# Patient Record
Sex: Male | Born: 1980 | Race: Black or African American | Hispanic: No | Marital: Married | Smoking: Current every day smoker
Health system: Southern US, Community
[De-identification: ages and names within clinical notes are randomized; demographics above are authoritative.]

## PROBLEM LIST (undated history)

## (undated) DIAGNOSIS — G4730 Sleep apnea, unspecified: Secondary | ICD-10-CM

## (undated) DIAGNOSIS — J45909 Unspecified asthma, uncomplicated: Secondary | ICD-10-CM

## (undated) DIAGNOSIS — I219 Acute myocardial infarction, unspecified: Secondary | ICD-10-CM

## (undated) DIAGNOSIS — I5030 Unspecified diastolic (congestive) heart failure: Secondary | ICD-10-CM

## (undated) DIAGNOSIS — I2510 Atherosclerotic heart disease of native coronary artery without angina pectoris: Secondary | ICD-10-CM

## (undated) DIAGNOSIS — K219 Gastro-esophageal reflux disease without esophagitis: Secondary | ICD-10-CM

## (undated) DIAGNOSIS — F419 Anxiety disorder, unspecified: Secondary | ICD-10-CM

## (undated) DIAGNOSIS — I10 Essential (primary) hypertension: Secondary | ICD-10-CM

## (undated) HISTORY — DX: Atherosclerotic heart disease of native coronary artery without angina pectoris: I25.10

## (undated) HISTORY — PX: SHOULDER SURGERY: SHX246

## (undated) HISTORY — DX: Unspecified diastolic (congestive) heart failure: I50.30

---

## 2004-08-23 ENCOUNTER — Emergency Department: Payer: Self-pay | Admitting: Emergency Medicine

## 2004-10-29 ENCOUNTER — Emergency Department: Payer: Self-pay | Admitting: Emergency Medicine

## 2009-07-19 ENCOUNTER — Inpatient Hospital Stay: Payer: Self-pay | Admitting: Surgery

## 2010-06-30 ENCOUNTER — Emergency Department: Payer: Self-pay | Admitting: Emergency Medicine

## 2011-05-17 ENCOUNTER — Emergency Department: Payer: Self-pay | Admitting: Unknown Physician Specialty

## 2012-10-13 ENCOUNTER — Emergency Department: Payer: Self-pay | Admitting: Emergency Medicine

## 2017-01-28 ENCOUNTER — Other Ambulatory Visit: Payer: Self-pay | Admitting: Family Medicine

## 2017-01-28 DIAGNOSIS — M25512 Pain in left shoulder: Secondary | ICD-10-CM

## 2017-02-17 ENCOUNTER — Other Ambulatory Visit: Payer: Self-pay | Admitting: Orthopedic Surgery

## 2017-02-17 DIAGNOSIS — M7542 Impingement syndrome of left shoulder: Secondary | ICD-10-CM

## 2017-02-17 DIAGNOSIS — Z139 Encounter for screening, unspecified: Secondary | ICD-10-CM

## 2017-03-02 ENCOUNTER — Ambulatory Visit: Payer: Self-pay

## 2017-03-12 ENCOUNTER — Ambulatory Visit
Admission: RE | Admit: 2017-03-12 | Discharge: 2017-03-12 | Disposition: A | Discharge status: Home / Self Care | Payer: No Typology Code available for payment source | Source: Ambulatory Visit | Attending: Orthopedic Surgery | Admitting: Orthopedic Surgery

## 2017-03-12 ENCOUNTER — Other Ambulatory Visit: Payer: Self-pay | Admitting: Orthopedic Surgery

## 2017-03-12 DIAGNOSIS — M7542 Impingement syndrome of left shoulder: Secondary | ICD-10-CM | POA: Insufficient documentation

## 2017-03-12 DIAGNOSIS — Z139 Encounter for screening, unspecified: Secondary | ICD-10-CM

## 2017-03-12 MED ORDER — GADOBENATE DIMEGLUMINE 529 MG/ML IV SOLN
5.0000 mL | Freq: Once | INTRAVENOUS | Status: AC | PRN
  Administered 2017-03-12: 0.01 mL via INTRA_ARTICULAR

## 2017-03-12 MED ORDER — IOPAMIDOL (ISOVUE-200) INJECTION 41%
50.0000 mL | Freq: Once | INTRAVENOUS | Status: AC
  Administered 2017-03-12: 15 mL via INTRA_ARTICULAR
  Filled 2017-03-12: qty 50

## 2017-03-12 MED ORDER — LIDOCAINE HCL (PF) 1 % IJ SOLN
10.0000 mL | Freq: Once | INTRAMUSCULAR | Status: AC
  Administered 2017-03-12: 10 mL
  Filled 2017-03-12: qty 10

## 2018-11-24 ENCOUNTER — Encounter: Payer: Self-pay | Admitting: Anesthesiology

## 2018-12-13 ENCOUNTER — Other Ambulatory Visit: Payer: Self-pay

## 2018-12-13 ENCOUNTER — Encounter
Admission: RE | Admit: 2018-12-13 | Discharge: 2018-12-13 | Disposition: A | Discharge status: Home / Self Care | Payer: No Typology Code available for payment source | Source: Ambulatory Visit | Attending: Surgery | Admitting: Surgery

## 2018-12-13 HISTORY — DX: Essential (primary) hypertension: I10

## 2018-12-13 HISTORY — DX: Anxiety disorder, unspecified: F41.9

## 2018-12-13 HISTORY — DX: Unspecified asthma, uncomplicated: J45.909

## 2018-12-13 HISTORY — DX: Sleep apnea, unspecified: G47.30

## 2018-12-13 HISTORY — DX: Gastro-esophageal reflux disease without esophagitis: K21.9

## 2018-12-13 NOTE — Patient Instructions (Signed)
Your procedure is scheduled on: 12-20-18 TUESDAY Report to Same Day Surgery 2nd floor medical mall [facility] Entrance-take elevator on left to 2nd floor.  Check in with surgery information desk.) To find out your arrival time please call [phone] between 1PM - 3PM on 12-19-18 MONDAY  Remember: Instructions that are not followed completely may result in serious medical risk, up to and including death, or upon the discretion of your surgeon and anesthesiologist your surgery may need to be rescheduled.    _x___ 1. Do not eat food after midnight the night before your procedure. NO GUM OR CANDY AFTER MIDNIGHT.  You may drink clear liquids up to 2 hours before you are scheduled to arrive at the hospital for your procedure.  Do not drink clear liquids within 2 hours of your scheduled arrival to the hospital.  Clear liquids include  --Water or Apple juice without pulp  --Clear carbohydrate beverage such as ClearFast or Gatorade  --Black Coffee or Clear Tea (No milk, no creamers, do not add anything to the coffee or Tea   ____Ensure clear carbohydrate drink on the way to the hospital for bariatric patients  ____Ensure clear carbohydrate drink 3 hours before surgery for Dr Rutherford Nail patients if physician instructed.    __x__ 2. No Alcohol for 24 hours before or after surgery.   __x__3. No Smoking or e-cigarettes for 24 prior to surgery.  Do not use any chewable tobacco products for at least 6 hour prior to surgery   ____  4. Bring all medications with you on the day of surgery if instructed.    __x__ 5. Notify your doctor if there is any change in your medical condition     (cold, fever, infections).    x___6. On the morning of surgery brush your teeth with toothpaste and water.  You may rinse your mouth with mouth wash if you wish.  Do not swallow any toothpaste or mouthwash.   Do not wear jewelry, make-up, hairpins, clips or nail polish.  Do not wear lotions, powders, or perfumes. You  may wear deodorant.  Do not shave 48 hours prior to surgery. Men may shave face and neck.  Do not bring valuables to the hospital.    [facility] is not responsible for any belongings or valuables.               Contacts, dentures or bridgework may not be worn into surgery.  Leave your suitcase in the car. After surgery it may be brought to your room.  For patients admitted to the hospital, discharge time is determined by your treatment team.  _  Patients discharged the day of surgery will not be allowed to drive home.  You will need someone to drive you home and stay with you the night of your procedure.    Please read over the following fact sheets that you were given:   [facility] Preparing for Surgery   ____ Take anti-hypertensive listed below, cardiac, seizure, asthma, anti-reflux and psychiatric medicines. These include:  1. NONE  2.  3.  4.  5.  6.  ____Fleets enema or Magnesium Citrate as directed.   _x___ Use CHG Soap or sage wipes as directed on instruction sheet   _X___ Use inhalers on the day of surgery and bring to hospital day of surgery-USE YOUR ALBUTEROL INHALER AND BRING TO HOSPITAL  ____ Stop Metformin and Janumet 2 days prior to surgery.    ____ Take 1/2 of usual insulin dose  the night before surgery and none on the morning surgery.   ____ Follow recommendations from Cardiologist, Pulmonologist or PCP regarding stopping Aspirin, Coumadin, Plavix ,Eliquis, Effient, or Pradaxa, and Pletal.  X____Stop Anti-inflammatories such as Advil, Aleve, Ibuprofen, Motrin, Naproxen, Naprosyn, Goodies powders or aspirin products NOW-OK to take Tylenol    ____ Stop supplements until after surgery.    _X___ Bring C-Pap to the hospital.

## 2018-12-14 ENCOUNTER — Encounter
Admission: RE | Admit: 2018-12-14 | Discharge: 2018-12-14 | Disposition: A | Discharge status: Home / Self Care | Payer: No Typology Code available for payment source | Source: Ambulatory Visit | Attending: Surgery | Admitting: Surgery

## 2018-12-14 ENCOUNTER — Other Ambulatory Visit: Payer: Self-pay

## 2018-12-14 DIAGNOSIS — Z01818 Encounter for other preprocedural examination: Secondary | ICD-10-CM | POA: Diagnosis present

## 2018-12-14 DIAGNOSIS — I10 Essential (primary) hypertension: Secondary | ICD-10-CM | POA: Insufficient documentation

## 2018-12-14 LAB — POTASSIUM: Potassium: 3.8 mmol/L (ref 3.5–5.1)

## 2018-12-19 MED ORDER — DEXTROSE 5 % IV SOLN
3.0000 g | Freq: Once | INTRAVENOUS | Status: AC
  Administered 2018-12-20: 3 g via INTRAVENOUS
  Filled 2018-12-19: qty 3

## 2018-12-20 ENCOUNTER — Other Ambulatory Visit: Payer: Self-pay

## 2018-12-20 ENCOUNTER — Encounter: Payer: Self-pay | Admitting: Anesthesiology

## 2018-12-20 ENCOUNTER — Ambulatory Visit
Admission: RE | Admit: 2018-12-20 | Discharge: 2018-12-20 | Disposition: A | Discharge status: Home / Self Care | Payer: No Typology Code available for payment source | Attending: Surgery | Admitting: Surgery

## 2018-12-20 ENCOUNTER — Encounter
Admission: RE | Disposition: A | Discharge status: Home / Self Care | Payer: Self-pay | Source: Home / Self Care | Attending: Surgery

## 2018-12-20 DIAGNOSIS — Z79899 Other long term (current) drug therapy: Secondary | ICD-10-CM | POA: Diagnosis not present

## 2018-12-20 DIAGNOSIS — G4733 Obstructive sleep apnea (adult) (pediatric): Secondary | ICD-10-CM | POA: Insufficient documentation

## 2018-12-20 DIAGNOSIS — Z6841 Body Mass Index (BMI) 40.0 and over, adult: Secondary | ICD-10-CM | POA: Diagnosis not present

## 2018-12-20 DIAGNOSIS — F17200 Nicotine dependence, unspecified, uncomplicated: Secondary | ICD-10-CM | POA: Insufficient documentation

## 2018-12-20 DIAGNOSIS — W010XXA Fall on same level from slipping, tripping and stumbling without subsequent striking against object, initial encounter: Secondary | ICD-10-CM | POA: Insufficient documentation

## 2018-12-20 DIAGNOSIS — J45909 Unspecified asthma, uncomplicated: Secondary | ICD-10-CM | POA: Insufficient documentation

## 2018-12-20 DIAGNOSIS — I10 Essential (primary) hypertension: Secondary | ICD-10-CM | POA: Diagnosis not present

## 2018-12-20 DIAGNOSIS — S63682A Other sprain of left thumb, initial encounter: Secondary | ICD-10-CM | POA: Insufficient documentation

## 2018-12-20 HISTORY — PX: CARPOMETACARPAL (CMC) FUSION OF THUMB: SHX6290

## 2018-12-20 SURGERY — CARPOMETACARPAL (CMC) FUSION OF THUMB
Anesthesia: General | Site: Hand | Laterality: Left

## 2018-12-20 MED ORDER — BUPIVACAINE HCL (PF) 0.5 % IJ SOLN
INTRAMUSCULAR | Status: DC | PRN
  Administered 2018-12-20: 10 mL

## 2018-12-20 MED ORDER — PROPOFOL 10 MG/ML IV BOLUS
INTRAVENOUS | Status: AC
  Filled 2018-12-20: qty 20

## 2018-12-20 MED ORDER — ONDANSETRON HCL 4 MG/2ML IJ SOLN
INTRAMUSCULAR | Status: AC
  Filled 2018-12-20: qty 2

## 2018-12-20 MED ORDER — HYDROCODONE-ACETAMINOPHEN 5-325 MG PO TABS
1.0000 | ORAL_TABLET | ORAL | Status: DC | PRN
  Administered 2018-12-20: 2 via ORAL

## 2018-12-20 MED ORDER — PHENYLEPHRINE HCL 10 MG/ML IJ SOLN
INTRAMUSCULAR | Status: DC | PRN
  Administered 2018-12-20 (×4): 100 ug via INTRAVENOUS

## 2018-12-20 MED ORDER — ONDANSETRON HCL 4 MG/2ML IJ SOLN: 4.0000 mg | Freq: Four times a day (QID) | INTRAMUSCULAR | Status: DC | PRN

## 2018-12-20 MED ORDER — FENTANYL CITRATE (PF) 100 MCG/2ML IJ SOLN
INTRAMUSCULAR | Status: AC
  Filled 2018-12-20: qty 2

## 2018-12-20 MED ORDER — DEXAMETHASONE SODIUM PHOSPHATE 10 MG/ML IJ SOLN
INTRAMUSCULAR | Status: DC | PRN
  Administered 2018-12-20: 5 mg via INTRAVENOUS

## 2018-12-20 MED ORDER — POTASSIUM CHLORIDE IN NACL 20-0.9 MEQ/L-% IV SOLN: INTRAVENOUS | Status: DC

## 2018-12-20 MED ORDER — LACTATED RINGERS IV SOLN
INTRAVENOUS | Status: DC
  Administered 2018-12-20: 15:00:00 via INTRAVENOUS

## 2018-12-20 MED ORDER — PROPOFOL 10 MG/ML IV BOLUS
INTRAVENOUS | Status: DC | PRN
  Administered 2018-12-20: 200 mg via INTRAVENOUS
  Administered 2018-12-20: 50 mg via INTRAVENOUS
  Administered 2018-12-20: 100 mg via INTRAVENOUS

## 2018-12-20 MED ORDER — FENTANYL CITRATE (PF) 100 MCG/2ML IJ SOLN
INTRAMUSCULAR | Status: AC
  Administered 2018-12-20: 50 ug via INTRAVENOUS
  Filled 2018-12-20: qty 2

## 2018-12-20 MED ORDER — ACETAMINOPHEN 10 MG/ML IV SOLN
INTRAVENOUS | Status: DC | PRN
  Administered 2018-12-20: 1000 mg via INTRAVENOUS

## 2018-12-20 MED ORDER — ONDANSETRON HCL 4 MG PO TABS: 4.0000 mg | ORAL_TABLET | Freq: Four times a day (QID) | ORAL | Status: DC | PRN

## 2018-12-20 MED ORDER — EPHEDRINE SULFATE 50 MG/ML IJ SOLN
INTRAMUSCULAR | Status: DC | PRN
  Administered 2018-12-20: 5 mg via INTRAVENOUS

## 2018-12-20 MED ORDER — METOCLOPRAMIDE HCL 10 MG PO TABS: 5.0000 mg | ORAL_TABLET | Freq: Three times a day (TID) | ORAL | Status: DC | PRN

## 2018-12-20 MED ORDER — FAMOTIDINE 20 MG PO TABS
ORAL_TABLET | ORAL | Status: AC
  Administered 2018-12-20: 20 mg via ORAL
  Filled 2018-12-20: qty 1

## 2018-12-20 MED ORDER — FENTANYL CITRATE (PF) 100 MCG/2ML IJ SOLN
INTRAMUSCULAR | Status: DC | PRN
  Administered 2018-12-20: 25 ug via INTRAVENOUS
  Administered 2018-12-20 (×3): 50 ug via INTRAVENOUS
  Administered 2018-12-20: 25 ug via INTRAVENOUS

## 2018-12-20 MED ORDER — SODIUM CHLORIDE FLUSH 0.9 % IV SOLN
INTRAVENOUS | Status: AC
  Filled 2018-12-20: qty 10

## 2018-12-20 MED ORDER — KETOROLAC TROMETHAMINE 30 MG/ML IJ SOLN
INTRAMUSCULAR | Status: AC
  Filled 2018-12-20: qty 1

## 2018-12-20 MED ORDER — OXYCODONE HCL 5 MG PO TABS: 5.0000 mg | ORAL_TABLET | ORAL | 0 refills | Status: DC | PRN

## 2018-12-20 MED ORDER — DEXAMETHASONE SODIUM PHOSPHATE 10 MG/ML IJ SOLN
INTRAMUSCULAR | Status: AC
  Filled 2018-12-20: qty 1

## 2018-12-20 MED ORDER — ACETAMINOPHEN 10 MG/ML IV SOLN
INTRAVENOUS | Status: AC
  Filled 2018-12-20: qty 100

## 2018-12-20 MED ORDER — MIDAZOLAM HCL 2 MG/2ML IJ SOLN
INTRAMUSCULAR | Status: DC | PRN
  Administered 2018-12-20: 2 mg via INTRAVENOUS

## 2018-12-20 MED ORDER — FENTANYL CITRATE (PF) 100 MCG/2ML IJ SOLN
25.0000 ug | INTRAMUSCULAR | Status: DC | PRN
  Administered 2018-12-20 (×3): 50 ug via INTRAVENOUS

## 2018-12-20 MED ORDER — HYDROCODONE-ACETAMINOPHEN 5-325 MG PO TABS
ORAL_TABLET | ORAL | Status: AC
  Filled 2018-12-20: qty 2

## 2018-12-20 MED ORDER — FAMOTIDINE 20 MG PO TABS
20.0000 mg | ORAL_TABLET | Freq: Once | ORAL | Status: AC
  Administered 2018-12-20: 20 mg via ORAL

## 2018-12-20 MED ORDER — METOCLOPRAMIDE HCL 5 MG/ML IJ SOLN: 5.0000 mg | Freq: Three times a day (TID) | INTRAMUSCULAR | Status: DC | PRN

## 2018-12-20 MED ORDER — HYDROCODONE-ACETAMINOPHEN 5-325 MG PO TABS: 1.0000 | ORAL_TABLET | Freq: Four times a day (QID) | ORAL | 0 refills | Status: DC | PRN

## 2018-12-20 MED ORDER — KETOROLAC TROMETHAMINE 30 MG/ML IJ SOLN
30.0000 mg | Freq: Once | INTRAMUSCULAR | Status: AC
  Administered 2018-12-20: 30 mg via INTRAVENOUS

## 2018-12-20 MED ORDER — ONDANSETRON HCL 4 MG/2ML IJ SOLN
INTRAMUSCULAR | Status: DC | PRN
  Administered 2018-12-20: 4 mg via INTRAVENOUS

## 2018-12-20 MED ORDER — PROMETHAZINE HCL 25 MG/ML IJ SOLN: 6.2500 mg | INTRAMUSCULAR | Status: DC | PRN

## 2018-12-20 MED ORDER — LIDOCAINE HCL (CARDIAC) PF 100 MG/5ML IV SOSY
PREFILLED_SYRINGE | INTRAVENOUS | Status: DC | PRN
  Administered 2018-12-20: 100 mg via INTRAVENOUS

## 2018-12-20 MED ORDER — GLYCOPYRROLATE 0.2 MG/ML IJ SOLN
INTRAMUSCULAR | Status: DC | PRN
  Administered 2018-12-20: 0.2 mg via INTRAVENOUS

## 2018-12-20 MED ORDER — MIDAZOLAM HCL 2 MG/2ML IJ SOLN
INTRAMUSCULAR | Status: AC
  Filled 2018-12-20: qty 2

## 2018-12-20 SURGICAL SUPPLY — 46 items
ANCHOR REPAIR HAND WRIST (Orthopedic Implant) ×2 IMPLANT
BANDAGE ACE 3X5.8 VEL STRL LF (GAUZE/BANDAGES/DRESSINGS) ×3 IMPLANT
BLADE DEBAKEY 8.0 (BLADE) ×2 IMPLANT
BLADE DEBAKEY 8.0MM (BLADE) ×1
BLADE OSC/SAGITTAL 5.5X25 (BLADE) ×3 IMPLANT
BLADE SURG SZ12 CARB STEEL (BLADE) ×6 IMPLANT
BNDG COHESIVE 4X5 TAN STRL (GAUZE/BANDAGES/DRESSINGS) ×3 IMPLANT
BNDG CONFORM 3 STRL LF (GAUZE/BANDAGES/DRESSINGS) ×3 IMPLANT
BNDG ESMARK 4X12 TAN STRL LF (GAUZE/BANDAGES/DRESSINGS) ×3 IMPLANT
BUR 4X55 1 (BURR) ×3 IMPLANT
CAST PADDING 3X4FT ST 30246 (SOFTGOODS) ×4
CLOSURE WOUND 1/2 X4 (GAUZE/BANDAGES/DRESSINGS) ×1
COVER WAND RF STERILE (DRAPES) ×3 IMPLANT
CUFF TOURN 18 STER (MISCELLANEOUS) ×3 IMPLANT
DRAPE FLUOR MINI C-ARM 54X84 (DRAPES) ×5 IMPLANT
DURAPREP 26ML APPLICATOR (WOUND CARE) ×6 IMPLANT
FORCEPS JEWEL BIP 4-3/4 STR (INSTRUMENTS) ×3 IMPLANT
GAUZE PETRO XEROFOAM 1X8 (MISCELLANEOUS) ×3 IMPLANT
GLOVE BIO SURGEON STRL SZ8 (GLOVE) ×6 IMPLANT
GLOVE INDICATOR 8.0 STRL GRN (GLOVE) ×3 IMPLANT
GOWN STRL REUS W/ TWL LRG LVL3 (GOWN DISPOSABLE) ×1 IMPLANT
GOWN STRL REUS W/ TWL XL LVL3 (GOWN DISPOSABLE) ×1 IMPLANT
GOWN STRL REUS W/TWL LRG LVL3 (GOWN DISPOSABLE) ×2
GOWN STRL REUS W/TWL XL LVL3 (GOWN DISPOSABLE) ×2
KIT TURNOVER KIT A (KITS) ×3 IMPLANT
NDL HYPO 25X1 1.5 SAFETY (NEEDLE) ×1 IMPLANT
NDL KEITH SZ2.5 (NEEDLE) ×3 IMPLANT
NEEDLE HYPO 25X1 1.5 SAFETY (NEEDLE) ×3 IMPLANT
NS IRRIG 500ML POUR BTL (IV SOLUTION) ×3 IMPLANT
PACK EXTREMITY ARMC (MISCELLANEOUS) ×3 IMPLANT
PAD CAST CTTN 3X4 STRL (SOFTGOODS) ×2 IMPLANT
PASSER SUT SWANSON 36MM LOOP (INSTRUMENTS) ×3 IMPLANT
SPLINT WRIST M LT TX990308 (SOFTGOODS) ×3 IMPLANT
STOCKINETTE 48X4 2 PLY STRL (GAUZE/BANDAGES/DRESSINGS) ×1 IMPLANT
STOCKINETTE IMPERVIOUS 9X36 MD (GAUZE/BANDAGES/DRESSINGS) ×3 IMPLANT
STOCKINETTE STRL 4IN 9604848 (GAUZE/BANDAGES/DRESSINGS) ×3 IMPLANT
STRIP CLOSURE SKIN 1/2X4 (GAUZE/BANDAGES/DRESSINGS) ×2 IMPLANT
SUT ETHIBOND 0 MO6 C/R (SUTURE) ×3 IMPLANT
SUT PROLENE 4 0 PS 2 18 (SUTURE) ×5 IMPLANT
SUT VIC AB 2-0 CT2 27 (SUTURE) ×3 IMPLANT
SUT VIC AB 2-0 SH 27 (SUTURE) ×4
SUT VIC AB 2-0 SH 27XBRD (SUTURE) ×1 IMPLANT
SUT VIC AB 3-0 SH 27 (SUTURE) ×2
SUT VIC AB 3-0 SH 27X BRD (SUTURE) ×1 IMPLANT
SYR 10ML LL (SYRINGE) ×3 IMPLANT
WIRE Z .062 C-WIRE SPADE TIP (WIRE) ×6 IMPLANT

## 2018-12-20 NOTE — Op Note (Signed)
12/20/2018  6:40 PM  Patient:   Marcus Bauer  Pre-Op Diagnosis:   Chronic tear of ulnar collateral ligament, left thumb.  Post-Op Diagnosis:   Same  Procedure:   Primary repair of ulnar collateral ligament, left thumb.  Surgeon:   Maryagnes Amos, MD  Assistant:   None  Anesthesia:   General LMA  Findings:   As above.  Complications:   None  EBL:   0 cc  Fluids:   900 cc crystalloid  TT:   53 minutes at 250 mmHg  Drains:   None  Closure:   4-0 Prolene interrupted sutures.  Implants:   3.5 mm Arthrex SwiveLocks 2  Brief Clinical Note:   The patient is a 38 year old male who injured his thumb about 4-5 months ago when he tripped and fell forward onto his left hand while trying to catch himself. Initially, he did not seek treatment and his symptoms of pain and swelling gradually improved. However, he continued to have difficulty holding and grasping objects. The patient's history and examination are consistent with persistent disruption of the ulnar collateral ligament of the left thumb MCP joint. The patient presents at this time for repair of the ulnar collateral ligament of the left thumb.  Procedure:   The patient was brought into the operating room and lain in the supine position. After adequate general laryngal mask anesthesia was obtained, the patient's left extremity were prepped with ChloraPrep solution before being draped sterilely. Preoperative antibiotics were administered. A timeout was performed to verify the appropriate surgical site before the limb was exsanguinated with an Esmarch and the tourniquet inflated to 250 mmHg. An approximately 2.5-3 cm incision was made over the ulnar aspect of the thumb MCP joint. The incision was carried down through the subcutaneous tissues with care taken to protect any neurovascular structures. The ulnar collateral ligament was identified and dissected free from the surrounding scar tissue.   Careful dissection was carried out  along the ulnar side of the base of the proximal phalanx as well as over the ulnar side of the metacarpal head just proximal to the ulnar collateral ligament origin. A 1.35 mm guidewire was inserted in the dorsal ulnar aspect of the metacarpal head at the origin of the UCL insertion site. A second guidewire was placed in the volar ulnar aspect of the base of the proximal phalanx. Each of these guidewires were overreamed with a 3.2 mm cannulated drill. A 3.5 mm Arthrex SwiveLock anchor was inserted distally after loading a 2-0 FiberWire and a 1.5 mm FiberTape. The FiberWire suture was passed through the ulnar collateral ligament and tied securely to secure the ulnar collateral ligament to the base of the proximal phalanx. A second 3.5 mm Arthrex SwiveLock anchor was placed proximally after loading 1 and of the 1.5 mm FiberTape and a second 2-0 FiberWire. With the thumb MCP joint flexed to 30, the anchor was inserted and tightened securely to reinforce the primary repair of the ulnar collateral ligament. he second 2-0 FiberWire was passed through the proximal portion of the ulnar collateral ligament to stabilize it to the metacarpal head. The two ends of the 1.5 mm fiber tape were then tied back to each other to further reinforce the repair. Gentle stretching of the repair demonstrated excellent stability.   The wound was thoroughly irrigated with sterile saline solution using bulb irrigation before the skin was closed using 2-0 Vicryl subcuticular sutures. Benzoin and Steri-Strips are applied to the skin before a 2-0 Vicryl  suture was used to repair the proximal fibers of the adductor hood, which had been released at the beginning the case to better visualize the ulnar collateral ligament and base of the proximal phalanx. A total of 10 cc of 0.5% plain Sensorcaine was injected in and around the incision site to help with postoperative analgesia. A thumb spica splint was applied to the thumb and forearm,  maintaining the wrist in neutral position and the thumb in slight abduction. The patient was then awakened, extubated, and returned to the recovery room in satisfactory condition after tolerating the procedure well.

## 2018-12-20 NOTE — H&P (Signed)
Paper H&P to be scanned into permanent record. H&P reviewed and patient re-examined. No changes. 

## 2018-12-20 NOTE — Transfer of Care (Signed)
Immediate Anesthesia Transfer of Care Note  Patient: Marcus Bauer  Procedure(s) Performed: REPAIR OF THE ULNAR COLLATERAL LIGAMENT OF THUMB (Left Hand)  Patient Location: PACU  Anesthesia Type:General  Level of Consciousness: awake, alert  and patient cooperative  Airway & Oxygen Therapy: Patient Spontanous Breathing and Patient connected to nasal cannula oxygen  Post-op Assessment: Report given to RN and Post -op Vital signs reviewed and stable  Post vital signs: Reviewed and stable  Last Vitals:  Vitals Value Taken Time  BP 99/78 12/20/2018  6:43 PM  Temp    Pulse 99 12/20/2018  6:45 PM  Resp 29 12/20/2018  6:45 PM  SpO2 99 % 12/20/2018  6:45 PM  Vitals shown include unvalidated device data.  Last Pain:  Vitals:   12/20/18 1441  TempSrc: Tympanic  PainSc: 6          Complications: No apparent anesthesia complications

## 2018-12-20 NOTE — Anesthesia Post-op Follow-up Note (Signed)
Anesthesia QCDR form completed.        

## 2018-12-20 NOTE — Anesthesia Procedure Notes (Signed)
Procedure Name: LMA Insertion Date/Time: 12/20/2018 5:10 PM Performed by: Karoline Caldwell, CRNA Pre-anesthesia Checklist: Patient identified, Patient being monitored, Timeout performed, Emergency Drugs available and Suction available Patient Re-evaluated:Patient Re-evaluated prior to induction Oxygen Delivery Method: Circle system utilized Preoxygenation: Pre-oxygenation with 100% oxygen Induction Type: IV induction Ventilation: Mask ventilation without difficulty and Oral airway inserted - appropriate to patient size LMA: LMA inserted LMA Size: 5.0 Tube type: Oral Number of attempts: 2 Placement Confirmation: positive ETCO2 and breath sounds checked- equal and bilateral Tube secured with: Tape Dental Injury: Teeth and Oropharynx as per pre-operative assessment  Comments: Unable to achieve adequate tidal volume with 4.5 LMA, removed and 5 LMA placed.

## 2018-12-20 NOTE — Discharge Instructions (Addendum)
Orthopedic discharge instructions: Keep splint dry and intact. Keep hand elevated above heart level. Apply ice to affected area frequently. Take ibuprofen 600-800 mg TID with meals for 7-10 days, then as necessary. Take pain medication as prescribed or ES Tylenol when needed.  Return for follow-up in 10-14 days or as scheduled. 

## 2018-12-20 NOTE — Anesthesia Preprocedure Evaluation (Signed)
Anesthesia Evaluation  Patient identified by MRN, date of birth, ID band Patient awake    Reviewed: Allergy & Precautions, H&P , NPO status , Patient's Chart, lab work & pertinent test results, reviewed documented beta blocker date and time   History of Anesthesia Complications (+) history of anesthetic complications  Airway Mallampati: III  TM Distance: >3 FB Neck ROM: full    Dental  (+) Dental Advidsory Given, Teeth Intact   Pulmonary neg shortness of breath, asthma , sleep apnea , neg COPD, neg recent URI, Current Smoker,           Cardiovascular Exercise Tolerance: Good hypertension, (-) angina(-) CAD, (-) Past MI, (-) Cardiac Stents and (-) CABG (-) dysrhythmias (-) Valvular Problems/Murmurs     Neuro/Psych Anxiety negative neurological ROS  negative psych ROS   GI/Hepatic Neg liver ROS, GERD  ,  Endo/Other  diabetes (borderline)Morbid obesity  Renal/GU negative Renal ROS  negative genitourinary   Musculoskeletal   Abdominal   Peds  Hematology negative hematology ROS (+)   Anesthesia Other Findings Past Medical History: No date: Anxiety     Comment:  NO MEDS No date: Asthma     Comment:  WELL CONTROLLED No date: GERD (gastroesophageal reflux disease)     Comment:  OCC TUMS No date: Hypertension No date: Sleep apnea     Comment:  WILL OCC USE NOT EVERY DAY   Reproductive/Obstetrics negative OB ROS                             Anesthesia Physical Anesthesia Plan  ASA: III  Anesthesia Plan: General   Post-op Pain Management:    Induction: Intravenous  PONV Risk Score and Plan: 1 and Ondansetron, Dexamethasone, Midazolam, Treatment may vary due to age or medical condition and Promethazine  Airway Management Planned: Oral ETT and LMA  Additional Equipment:   Intra-op Plan:   Post-operative Plan: Extubation in OR  Informed Consent: I have reviewed the patients History  and Physical, chart, labs and discussed the procedure including the risks, benefits and alternatives for the proposed anesthesia with the patient or authorized representative who has indicated his/her understanding and acceptance.     Dental Advisory Given  Plan Discussed with: Anesthesiologist, CRNA and Surgeon  Anesthesia Plan Comments:         Anesthesia Quick Evaluation

## 2018-12-21 ENCOUNTER — Encounter: Payer: Self-pay | Admitting: Surgery

## 2018-12-21 NOTE — Anesthesia Postprocedure Evaluation (Signed)
Anesthesia Post Note  Patient: Marcus Bauer  Procedure(s) Performed: REPAIR OF THE ULNAR COLLATERAL LIGAMENT OF THUMB (Left Hand)  Patient location during evaluation: PACU Anesthesia Type: General Level of consciousness: awake and alert Pain management: pain level controlled Vital Signs Assessment: post-procedure vital signs reviewed and stable Respiratory status: spontaneous breathing, nonlabored ventilation, respiratory function stable and patient connected to nasal cannula oxygen Cardiovascular status: blood pressure returned to baseline and stable Postop Assessment: no apparent nausea or vomiting Anesthetic complications: no     Last Vitals:  Vitals:   12/20/18 1925 12/20/18 1940  BP:    Pulse: 89 88  Resp:  20  Temp:    SpO2:  96%    Last Pain:  Vitals:   12/20/18 1910  TempSrc:   PainSc: 7                  Lenard Simmer

## 2019-08-12 ENCOUNTER — Emergency Department: Payer: No Typology Code available for payment source

## 2019-08-12 ENCOUNTER — Inpatient Hospital Stay
Admission: EM | Admit: 2019-08-12 | Discharge: 2019-08-15 | DRG: 281 | Disposition: A | Discharge status: Home / Self Care | Payer: No Typology Code available for payment source | Attending: Internal Medicine | Admitting: Internal Medicine

## 2019-08-12 ENCOUNTER — Observation Stay: Payer: No Typology Code available for payment source

## 2019-08-12 ENCOUNTER — Other Ambulatory Visit: Payer: Self-pay

## 2019-08-12 DIAGNOSIS — R079 Chest pain, unspecified: Secondary | ICD-10-CM | POA: Diagnosis not present

## 2019-08-12 DIAGNOSIS — F411 Generalized anxiety disorder: Secondary | ICD-10-CM | POA: Diagnosis present

## 2019-08-12 DIAGNOSIS — I214 Non-ST elevation (NSTEMI) myocardial infarction: Secondary | ICD-10-CM | POA: Diagnosis not present

## 2019-08-12 DIAGNOSIS — E785 Hyperlipidemia, unspecified: Secondary | ICD-10-CM | POA: Diagnosis present

## 2019-08-12 DIAGNOSIS — K219 Gastro-esophageal reflux disease without esophagitis: Secondary | ICD-10-CM | POA: Diagnosis present

## 2019-08-12 DIAGNOSIS — R55 Syncope and collapse: Secondary | ICD-10-CM | POA: Diagnosis present

## 2019-08-12 DIAGNOSIS — I10 Essential (primary) hypertension: Secondary | ICD-10-CM | POA: Diagnosis present

## 2019-08-12 DIAGNOSIS — Z20828 Contact with and (suspected) exposure to other viral communicable diseases: Secondary | ICD-10-CM | POA: Diagnosis present

## 2019-08-12 DIAGNOSIS — Z8249 Family history of ischemic heart disease and other diseases of the circulatory system: Secondary | ICD-10-CM

## 2019-08-12 DIAGNOSIS — I2510 Atherosclerotic heart disease of native coronary artery without angina pectoris: Secondary | ICD-10-CM | POA: Diagnosis present

## 2019-08-12 DIAGNOSIS — Z6841 Body Mass Index (BMI) 40.0 and over, adult: Secondary | ICD-10-CM

## 2019-08-12 DIAGNOSIS — J45909 Unspecified asthma, uncomplicated: Secondary | ICD-10-CM | POA: Diagnosis present

## 2019-08-12 DIAGNOSIS — Z79899 Other long term (current) drug therapy: Secondary | ICD-10-CM

## 2019-08-12 DIAGNOSIS — N179 Acute kidney failure, unspecified: Secondary | ICD-10-CM | POA: Diagnosis present

## 2019-08-12 DIAGNOSIS — F17210 Nicotine dependence, cigarettes, uncomplicated: Secondary | ICD-10-CM | POA: Diagnosis present

## 2019-08-12 DIAGNOSIS — G4440 Drug-induced headache, not elsewhere classified, not intractable: Secondary | ICD-10-CM | POA: Diagnosis not present

## 2019-08-12 DIAGNOSIS — T463X5A Adverse effect of coronary vasodilators, initial encounter: Secondary | ICD-10-CM | POA: Diagnosis not present

## 2019-08-12 DIAGNOSIS — Z836 Family history of other diseases of the respiratory system: Secondary | ICD-10-CM

## 2019-08-12 DIAGNOSIS — G4733 Obstructive sleep apnea (adult) (pediatric): Secondary | ICD-10-CM | POA: Diagnosis present

## 2019-08-12 LAB — URINE DRUG SCREEN, QUALITATIVE (ARMC ONLY)
Amphetamines, Ur Screen: NOT DETECTED
Barbiturates, Ur Screen: NOT DETECTED
Benzodiazepine, Ur Scrn: NOT DETECTED
Cannabinoid 50 Ng, Ur ~~LOC~~: POSITIVE — AB
Cocaine Metabolite,Ur ~~LOC~~: NOT DETECTED
MDMA (Ecstasy)Ur Screen: NOT DETECTED
Methadone Scn, Ur: NOT DETECTED
Opiate, Ur Screen: POSITIVE — AB
Phencyclidine (PCP) Ur S: NOT DETECTED
Tricyclic, Ur Screen: NOT DETECTED

## 2019-08-12 LAB — CBC
HCT: 41.8 % (ref 39.0–52.0)
Hemoglobin: 13.9 g/dL (ref 13.0–17.0)
MCH: 28.4 pg (ref 26.0–34.0)
MCHC: 33.3 g/dL (ref 30.0–36.0)
MCV: 85.3 fL (ref 80.0–100.0)
Platelets: 187 10*3/uL (ref 150–400)
RBC: 4.9 MIL/uL (ref 4.22–5.81)
RDW: 14.6 % (ref 11.5–15.5)
WBC: 8.6 10*3/uL (ref 4.0–10.5)
nRBC: 0 % (ref 0.0–0.2)

## 2019-08-12 LAB — GLUCOSE, CAPILLARY: Glucose-Capillary: 146 mg/dL — ABNORMAL HIGH (ref 70–99)

## 2019-08-12 LAB — BASIC METABOLIC PANEL
Anion gap: 9 (ref 5–15)
BUN: 12 mg/dL (ref 6–20)
CO2: 25 mmol/L (ref 22–32)
Calcium: 8.2 mg/dL — ABNORMAL LOW (ref 8.9–10.3)
Chloride: 107 mmol/L (ref 98–111)
Creatinine, Ser: 1.84 mg/dL — ABNORMAL HIGH (ref 0.61–1.24)
GFR calc Af Amer: 54 mL/min — ABNORMAL LOW (ref >60)
GFR calc non Af Amer: 46 mL/min — ABNORMAL LOW (ref >60)
Glucose, Bld: 110 mg/dL — ABNORMAL HIGH (ref 70–99)
Potassium: 3.6 mmol/L (ref 3.5–5.1)
Sodium: 141 mmol/L (ref 135–145)

## 2019-08-12 LAB — URINALYSIS, COMPLETE (UACMP) WITH MICROSCOPIC
Bacteria, UA: NONE SEEN
Bilirubin Urine: NEGATIVE
Glucose, UA: NEGATIVE mg/dL
Hgb urine dipstick: NEGATIVE
Ketones, ur: NEGATIVE mg/dL
Leukocytes,Ua: NEGATIVE
Nitrite: NEGATIVE
Protein, ur: 30 mg/dL — AB
Specific Gravity, Urine: 1.021 (ref 1.005–1.030)
Squamous Epithelial / HPF: NONE SEEN (ref 0–5)
pH: 5 (ref 5.0–8.0)

## 2019-08-12 LAB — T4, FREE: Free T4: 0.83 ng/dL (ref 0.61–1.12)

## 2019-08-12 LAB — TROPONIN I (HIGH SENSITIVITY)
Troponin I (High Sensitivity): 7 ng/L (ref <18)
Troponin I (High Sensitivity): 8 ng/L (ref <18)
Troponin I (High Sensitivity): 6 ng/L (ref <18)

## 2019-08-12 LAB — TSH: TSH: 0.371 u[IU]/mL (ref 0.350–4.500)

## 2019-08-12 MED ORDER — ATORVASTATIN CALCIUM 20 MG PO TABS: 20.0000 mg | ORAL_TABLET | Freq: Every day | ORAL | Status: DC

## 2019-08-12 MED ORDER — ACETAMINOPHEN 325 MG PO TABS
650.0000 mg | ORAL_TABLET | ORAL | Status: DC | PRN
  Administered 2019-08-13: 650 mg via ORAL
  Filled 2019-08-12: qty 2

## 2019-08-12 MED ORDER — NITROGLYCERIN 2 % TD OINT
0.5000 [in_us] | TOPICAL_OINTMENT | Freq: Once | TRANSDERMAL | Status: AC
  Administered 2019-08-12: 0.5 [in_us] via TOPICAL
  Filled 2019-08-12: qty 1

## 2019-08-12 MED ORDER — MORPHINE SULFATE (PF) 2 MG/ML IV SOLN
INTRAVENOUS | Status: AC
  Filled 2019-08-12: qty 1

## 2019-08-12 MED ORDER — NICOTINE 14 MG/24HR TD PT24
14.0000 mg | MEDICATED_PATCH | Freq: Every day | TRANSDERMAL | Status: DC
  Administered 2019-08-13 – 2019-08-14 (×3): 14 mg via TRANSDERMAL
  Filled 2019-08-12 (×3): qty 1

## 2019-08-12 MED ORDER — MORPHINE SULFATE (PF) 2 MG/ML IV SOLN
2.0000 mg | INTRAVENOUS | Status: DC | PRN
  Administered 2019-08-13 – 2019-08-14 (×5): 2 mg via INTRAVENOUS
  Filled 2019-08-12 (×5): qty 1

## 2019-08-12 MED ORDER — OXYCODONE-ACETAMINOPHEN 5-325 MG PO TABS
1.0000 | ORAL_TABLET | Freq: Four times a day (QID) | ORAL | Status: DC | PRN
  Administered 2019-08-13 – 2019-08-15 (×4): 1 via ORAL
  Filled 2019-08-12 (×4): qty 1

## 2019-08-12 MED ORDER — IOHEXOL 350 MG/ML SOLN
75.0000 mL | Freq: Once | INTRAVENOUS | Status: AC | PRN
  Administered 2019-08-12: 75 mL via INTRAVENOUS

## 2019-08-12 MED ORDER — ONDANSETRON HCL 4 MG/2ML IJ SOLN
4.0000 mg | Freq: Four times a day (QID) | INTRAMUSCULAR | Status: DC | PRN
  Administered 2019-08-14: 4 mg via INTRAVENOUS
  Filled 2019-08-12: qty 2

## 2019-08-12 MED ORDER — NITROGLYCERIN 0.4 MG SL SUBL: 0.4000 mg | SUBLINGUAL_TABLET | SUBLINGUAL | Status: DC | PRN

## 2019-08-12 MED ORDER — ASPIRIN EC 81 MG PO TBEC
81.0000 mg | DELAYED_RELEASE_TABLET | Freq: Every day | ORAL | Status: DC
  Administered 2019-08-13 – 2019-08-15 (×3): 81 mg via ORAL
  Filled 2019-08-12 (×3): qty 1

## 2019-08-12 MED ORDER — ENOXAPARIN SODIUM 40 MG/0.4ML ~~LOC~~ SOLN
40.0000 mg | Freq: Two times a day (BID) | SUBCUTANEOUS | Status: DC
  Administered 2019-08-13: 40 mg via SUBCUTANEOUS
  Filled 2019-08-12 (×2): qty 0.4

## 2019-08-12 MED ORDER — MORPHINE SULFATE (PF) 4 MG/ML IV SOLN
INTRAVENOUS | Status: AC
  Filled 2019-08-12: qty 1

## 2019-08-12 MED ORDER — MORPHINE SULFATE (PF) 4 MG/ML IV SOLN
4.0000 mg | Freq: Once | INTRAVENOUS | Status: AC
  Administered 2019-08-12: 4 mg via INTRAVENOUS

## 2019-08-12 MED ORDER — NITROGLYCERIN 0.4 MG SL SUBL
0.4000 mg | SUBLINGUAL_TABLET | SUBLINGUAL | Status: DC | PRN
  Administered 2019-08-12: 0.4 mg via SUBLINGUAL
  Filled 2019-08-12 (×2): qty 1

## 2019-08-12 MED ORDER — SODIUM CHLORIDE 0.9 % IV SOLN
INTRAVENOUS | Status: DC
  Administered 2019-08-12: 21:00:00 via INTRAVENOUS

## 2019-08-12 NOTE — Progress Notes (Signed)
PHARMACIST - PHYSICIAN COMMUNICATION  CONCERNING:  Enoxaparin (Lovenox) for DVT Prophylaxis    RECOMMENDATION: Patient was prescribed enoxaprin 40mg  q24 hours for VTE prophylaxis.   Filed Weights   08/12/19 1638  Weight: 267 lb (121.1 kg)    Body mass index is 41.82 kg/m.  Estimated Creatinine Clearance: 69.8 mL/min (A) (by C-G formula based on SCr of 1.84 mg/dL (H)).   Based on [place] patient is candidate for enoxaparin 40mg  every 12 hour dosing due to BMI being >40.    DESCRIPTION: Pharmacy has adjusted enoxaparin dose per [facility] policy.  Patient is now receiving enoxaparin 40mg  every 12 hours.     Rowland Lathe, PharmD Clinical Pharmacist  08/12/2019 7:21 PM

## 2019-08-12 NOTE — ED Provider Notes (Signed)
[facility] Emergency Department Provider Note       Time seen: ----------------------------------------- 4:36 PM on 08/12/2019 -----------------------------------------   I have reviewed the triage vital signs and the nursing notes.  HISTORY   Chief Complaint Chest Pain    HPI Marcus Bauer is a 38 y.o. male with a history of anxiety, asthma, hypertension who presents to the ED for central chest pain that started while he was working outside.  Patient felt pain in the center and left side of his chest and felt weak and near syncopal.  His brother had to catch him to keep her from falling.  He felt short of breath.  Patient reports he has not started his blood pressure medicine that he was supposed to take recently.  Past Medical History:  Diagnosis Date  . Anxiety    NO MEDS  . Asthma    WELL CONTROLLED  . GERD (gastroesophageal reflux disease)    OCC TUMS  . Hypertension   . Sleep apnea    WILL OCC USE NOT EVERY DAY    There are no active problems to display for this patient.   Past Surgical History:  Procedure Laterality Date  . CARPOMETACARPAL ([place]) FUSION OF THUMB Left 12/20/2018   Procedure: REPAIR OF THE ULNAR COLLATERAL LIGAMENT OF THUMB;  Surgeon: Corky Mull, MD;  Location: [facility] ORS;  Service: Orthopedics;  Laterality: Left;  ARTHREX SMALL JOINT INTERNAL BRACE KIT  . SHOULDER SURGERY Left     Allergies Shellfish allergy and Other  Social History Social History   Tobacco Use  . Smoking status: Current Every Day Smoker    Packs/day: 1.00    Years: 20.00    Pack years: 20.00    Types: Cigarettes  . Smokeless tobacco: Current User    Types: Chew  Substance Use Topics  . Alcohol use: Yes    Comment: OCC  . Drug use: Never   Review of Systems Constitutional: Negative for fever. Cardiovascular: Positive for chest pain Respiratory: Negative for shortness of breath. Gastrointestinal: Negative for abdominal pain, vomiting  and diarrhea. Musculoskeletal: Negative for back pain. Skin: Negative for rash. Neurological: Positive for weakness  All systems negative/normal/unremarkable except as stated in the HPI  ____________________________________________   PHYSICAL EXAM:  VITAL SIGNS: ED Triage Vitals  Enc Vitals Group     BP      Pulse      Resp      Temp      Temp src      SpO2      Weight      Height      Head Circumference      Peak Flow      Pain Score      Pain Loc      Pain Edu?      Excl. in [place]?    Constitutional: Alert and oriented.  Mild distress Eyes: Conjunctivae are normal. Normal extraocular movements. ENT      Head: Normocephalic and atraumatic.      Nose: No congestion/rhinnorhea.      Mouth/Throat: Mucous membranes are moist.      Neck: No stridor. Cardiovascular: Normal rate, regular rhythm. No murmurs, rubs, or gallops. Respiratory: Normal respiratory effort without tachypnea nor retractions. Breath sounds are clear and equal bilaterally. No wheezes/rales/rhonchi. Gastrointestinal: Soft and nontender. Normal bowel sounds Musculoskeletal: Nontender with normal range of motion in extremities. No lower extremity tenderness nor edema. Neurologic:  Normal speech and language. No gross  focal neurologic deficits are appreciated.  Skin:  Skin is warm, dry and intact. No rash noted. Psychiatric: Mood and affect are normal. Speech and behavior are normal.  ____________________________________________  EKG: Interpreted by me.  Sinus rhythm the rate is 77 bpm, some slight ST depression and T wave inversion is noted inferiorly, slight anterior ST elevation, normal axis, normal QT  ____________________________________________  ED COURSE:  As part of my medical decision making, I reviewed the following data within the [place] History obtained from family if available, nursing notes, old chart and ekg, as well as notes from prior ED visits. Patient presented for  chest pain, we will assess with labs and imaging as indicated at this time.   Procedures  Marcus Bauer was evaluated in Emergency Department on 08/12/2019 for the symptoms described in the history of present illness. He was evaluated in the context of the global COVID-19 pandemic, which necessitated consideration that the patient might be at risk for infection with the SARS-CoV-2 virus that causes COVID-19. Institutional protocols and algorithms that pertain to the evaluation of patients at risk for COVID-19 are in a state of rapid change based on information released by regulatory bodies including the CDC and federal and state organizations. These policies and algorithms were followed during the patient's care in the ED.  ____________________________________________   LABS (pertinent positives/negatives)  Labs Reviewed  BASIC METABOLIC PANEL - Abnormal; Notable for the following components:      Result Value   Glucose, Bld 110 (*)    Creatinine, Ser 1.84 (*)    Calcium 8.2 (*)    GFR calc non Af Amer 46 (*)    GFR calc Af Amer 54 (*)    All other components within normal limits  SARS CORONAVIRUS 2 (TAT 6-24 HRS)  CBC  TROPONIN I (HIGH SENSITIVITY)    RADIOLOGY Images were viewed by me  Chest x-ray IMPRESSION:  No edema or consolidation.  ____________________________________________   DIFFERENTIAL DIAGNOSIS   Musculoskeletal pain, GERD, MI, PE  FINAL ASSESSMENT AND PLAN  Chest pain, near syncope   Plan: The patient had presented for chest pain. Patient's labs did reveal some elevated creatinine to 1.84 but otherwise no acute process.  Initial troponin is negative. Patient's imaging was reassuring.  His chest pain is unusual and he had near syncope with it.  He does not have shortness of breath and it is waxing and waning.  Due to his unusual EKG I will recommend hospital observation.   Laurence Aly, MD    Note: This note was generated in part or whole with  voice recognition software. Voice recognition is usually quite accurate but there are transcription errors that can and very often do occur. I apologize for any typographical errors that were not detected and corrected.     Earleen Newport, MD 08/12/19 [phone]

## 2019-08-12 NOTE — ED Notes (Signed)
Attempted to call report to 2 A, states they have not assigned a nurse yet, will call back in 10 minutes.

## 2019-08-12 NOTE — H&P (Addendum)
[facility] NAME: Marcus Bauer    MR#:  [id number]  DATE OF BIRTH:  01-11-1981  DATE OF ADMISSION:  08/12/2019  PRIMARY CARE PHYSICIAN: Dion Body, MD   REQUESTING/REFERRING PHYSICIAN: Lenise Arena, MD  CHIEF COMPLAINT:   Chief Complaint  Patient presents with   Chest Pain    HISTORY OF PRESENT ILLNESS:  38 y.o. male with pertinent past medical history of hypertension, OSA not on CPAP, tobacco abuse, GERD, and asthma presenting to the ED with chief complaints of chest pain and near syncopal episode.  Patient report that he was standing talking to his family at around 3 am when he had sudden onset chest pain associated with nausea without vomiting. He describes symptoms of chest tightness and pressure that was radiating to his left arm.  Patient denies shortness of breath, diaphoresis, vomiting, or abdominal pain.  Patient state he had near syncopal associated with lightheadedness describing as if he is "fading out".  Patient states he did not fall to the ground as his brother was able to catch him prior to falling. He denies symptoms preceding the event of vomiting, feeling cold or clammy, visual auras or blurry vision, palpitations, shortness of breath. No symptoms following the event, chest pain, bladder or bowel incontinence.  Patient's brother called EMS and patient was transported to the ED for further evaluation.  Patient received aspirin and nitroglycerin patch en route to the ED.  On arrival to the ED, he was afebrile with blood pressure 130/87 mm Hg and pulse rate 78beats/min. There were no focal neurological deficits; he was alert and oriented x4, and he did not demonstrate any memory deficits.  ECG showed sinus rhythm the rate is 77 bpm, some slight ST depression and T wave inversion is noted inferiorly, slight anterior ST elevation, normal axis, normal QT.  Initial labs revealed glucose 110, creatinine 1.84,  CBC unremarkable, troponin 8 and COVID-19 pending.  Chest x-ray showed no active cardiopulmonary process.  Even his risk factors and abnormal EKG patient will be admitted under hospitalist service for further management.   PAST MEDICAL HISTORY:   Past Medical History:  Diagnosis Date   Anxiety    NO MEDS   Asthma    WELL CONTROLLED   GERD (gastroesophageal reflux disease)    OCC TUMS   Hypertension    Sleep apnea    WILL OCC USE NOT EVERY DAY    PAST SURGICAL HISTORY:   Past Surgical History:  Procedure Laterality Date   CARPOMETACARPAL ([place]) FUSION OF THUMB Left 12/20/2018   Procedure: REPAIR OF THE ULNAR COLLATERAL LIGAMENT OF THUMB;  Surgeon: Corky Mull, MD;  Location: [facility] ORS;  Service: Orthopedics;  Laterality: Left;  ARTHREX SMALL JOINT INTERNAL BRACE KIT   SHOULDER SURGERY Left     SOCIAL HISTORY:   Social History   Tobacco Use   Smoking status: Current Every Day Smoker    Packs/day: 1.00    Years: 20.00    Pack years: 20.00    Types: Cigarettes   Smokeless tobacco: Current User    Types: Chew  Substance Use Topics   Alcohol use: Yes    Comment: OCC    FAMILY HISTORY:  History reviewed. No pertinent family history.  DRUG ALLERGIES:   Allergies  Allergen Reactions   Shellfish Allergy Shortness Of Breath   Other Hives    CLOROX    REVIEW OF SYSTEMS:   Review of Systems  Constitutional:  Negative for chills, fever, malaise/fatigue and weight loss.  HENT: Negative for congestion, hearing loss and sore throat.   Eyes: Negative for blurred vision and double vision.  Respiratory: Positive for shortness of breath. Negative for cough and wheezing.   Cardiovascular: Positive for chest pain. Negative for palpitations, orthopnea and leg swelling.  Gastrointestinal: Positive for nausea. Negative for abdominal pain, diarrhea and vomiting.  Genitourinary: Negative for dysuria and urgency.  Musculoskeletal: Negative for myalgias.  Skin: Negative  for rash.  Neurological: Positive for dizziness. Negative for sensory change, speech change, focal weakness and headaches.  Psychiatric/Behavioral: Negative for depression.    MEDICATIONS AT HOME:   Prior to Admission medications   Medication Sig Start Date End Date Taking? Authorizing Provider  albuterol (PROVENTIL HFA;VENTOLIN HFA) 108 (90 Base) MCG/ACT inhaler Inhale 2 puffs into the lungs every 6 (six) hours as needed for wheezing or shortness of breath.    [provider]  calcium carbonate (TUMS - DOSED IN MG ELEMENTAL CALCIUM) 500 MG chewable tablet Chew 1 tablet by mouth as needed for indigestion or heartburn.    [provider]  hydrochlorothiazide (MICROZIDE) 12.5 MG capsule Take 12.5 mg by mouth daily.    [provider]  HYDROcodone-acetaminophen (NORCO/VICODIN) 5-325 MG tablet Take 1-2 tablets by mouth every 6 (six) hours as needed for moderate pain. 12/20/18   Poggi, Marshall Cork, MD  oxyCODONE (ROXICODONE) 5 MG immediate release tablet Take 1-2 tablets (5-10 mg total) by mouth every 4 (four) hours as needed. 12/20/18   Poggi, Marshall Cork, MD      VITAL SIGNS:  Blood pressure 123/78, pulse 71, temperature 98 F (36.7 C), temperature source Oral, resp. rate 20, height _0  (1.702 m), weight 121.1 kg, SpO2 97 %.  PHYSICAL EXAMINATION:   Physical Exam  GENERAL:  38 y.o.-year-old patient lying in the bed with no acute distress.  EYES: Pupils equal, round, reactive to light and accommodation. No scleral icterus. Extraocular muscles intact.  HEENT: Head atraumatic, normocephalic. Oropharynx and nasopharynx clear.  NECK:  Supple, no jugular venous distention. No thyroid enlargement, no tenderness.  LUNGS: Normal breath sounds bilaterally, no wheezing, rales,rhonchi or crepitation. No use of accessory muscles of respiration.  CARDIOVASCULAR: S1, S2 normal. No murmurs, rubs, or gallops.  ABDOMEN: Soft, nontender, nondistended. Bowel sounds present. No organomegaly or  mass.  EXTREMITIES: No pedal edema, cyanosis, or clubbing.  NEUROLOGIC: Cranial nerves II through XII are intact. Muscle strength 5/5 in all extremities. Sensation intact. Gait not checked.  PSYCHIATRIC: The patient is alert and oriented x 3.  SKIN: No obvious rash, lesion, or ulcer.   DATA REVIEWED:  LABORATORY PANEL:   CBC Recent Labs  Lab 08/12/19 1646  WBC 8.6  HGB 13.9  HCT 41.8  PLT 187   ------------------------------------------------------------------------------------------------------------------  Chemistries  Recent Labs  Lab 08/12/19 1646  NA 141  K 3.6  CL 107  CO2 25  GLUCOSE 110*  BUN 12  CREATININE 1.84*  CALCIUM 8.2*   ------------------------------------------------------------------------------------------------------------------  Cardiac Enzymes No results for input(s): TROPONINI in the last 168 hours. ------------------------------------------------------------------------------------------------------------------  RADIOLOGY:  Dg Chest Port 1 View  Result Date: 08/12/2019 CLINICAL DATA:  Chest pain and shortness of breath EXAM: PORTABLE CHEST 1 VIEW COMPARISON:  July 20, 2009 FINDINGS: Lungs are clear. Heart size and pulmonary vascularity are normal. No adenopathy. No pneumothorax. No bone lesions. IMPRESSION: No edema or consolidation. Electronically Signed   By: Lowella Grip III M.D.   On: 08/12/2019 17:04  EKG:  EKG: Sinus rhythm the rate is 77 bpm, some slight ST depression and T wave inversion is noted inferiorly, slight anterior ST elevation, normal axis, normal QT  IMPRESSION AND PLAN:   38 y.o. male  with pertinent past medical history of hypertension, OSA not on CPAP, tobacco abuse, GERD, and asthma presenting to the ED with chief complaints of chest pain and near syncopal episode.  1. Chest pain -  dynamic EKG changes with normal cardiac enzymes - Admit to telemetry unit - Trend troponin - ASA 60m PO daily  - Start  Atorvastatin 233mPO daily, check lipid panel  - Hold betablocker due to hx of Asthma - Check CTA angio chest rule out PE - Check Echo - NTG + morphine PRN chest pain or NTG drip if ongoing chest pain (Hold if hypotensive / cardiogenic shock) - Hold Anti-coagulation as pain improved with normal troponin - Cardiology Consult   2. Near syncope - CT head - Check UA and UDS - Check TSH - [place]ids - Orthostatics vital signs  3. Acute kidney injury - Hold nephrotoxins - IV fluids hydration - Continue to monitor renal function  4. Hypertension - stable - Continue to monitor  5. OSA - Not using CPAP - encouraged to use nocturnal CPAP  6. Tobacco Abuse - Smoking cessation counseling provided - Nicotine patch provided  7. GERD - Protonix  8. DVT prophylaxis - Enoxaparin SubQ   All the records are reviewed and case discussed with ED provider. Management plans discussed with the patient, family and they are in agreement.  CODE STATUS: FULL  TOTAL TIME TAKING CARE OF THIS PATIENT: 50 minutes.    on 08/12/2019 at 7:10 PM   ElRufina FalcoDNP, FNP-BC [facility] Nurse Practitioner Between 7am to 6pm - Pager - [phone] 6pm go to [email or web address] - password [email or web address]  Office  [phone]: Primary care physician; LiDion BodyMD

## 2019-08-12 NOTE — ED Notes (Signed)
Patient transported to CT 

## 2019-08-12 NOTE — ED Triage Notes (Signed)
Pt was working outside working and felt left/center chest pain. Denies radiation. Pt feels weak and SOB

## 2019-08-12 NOTE — ED Notes (Signed)
Pt has diet order. Pt given sandwich tray and drink. Tolerated well.

## 2019-08-12 NOTE — Progress Notes (Addendum)
Staff was doing admission when pt suddenly felt 10/10 left chest pain radiating to the his neck. Pt was very restless in the bed. 4 staff needs to needs to hold him to just get the v/s and ekg done. Notied prime and talked to [place] and states will place order. Will continue to monitor.  Update 2142: Ouma ordered CTA chest, repeat trops, morphine PRN 4 mg stat, morphine 2 mg every 3 hours PRN and percocet 5-325 mg every 6 hours PRN. Will continue to monitor.  Update 0035: Lab called and reported that troponin went up to 290. Pt was asleep at this time but did mention he still feel 3/10 chest pressure. Page prime. Will continue to monitor.  Update 0040: Ouma states will start pt on hep gt. Will continue to monitor.  Update 0053: Ouma states to place pt ON NPO except with medicines. Will continue to monitor.

## 2019-08-13 ENCOUNTER — Inpatient Hospital Stay: Payer: No Typology Code available for payment source

## 2019-08-13 ENCOUNTER — Encounter
Admission: EM | Disposition: A | Discharge status: Home / Self Care | Payer: Self-pay | Source: Home / Self Care | Attending: Internal Medicine

## 2019-08-13 ENCOUNTER — Inpatient Hospital Stay (HOSPITAL_COMMUNITY)
Admit: 2019-08-13 | Discharge: 2019-08-13 | Disposition: A | Discharge status: Home / Self Care | Payer: No Typology Code available for payment source | Attending: Internal Medicine | Admitting: Internal Medicine

## 2019-08-13 DIAGNOSIS — G4440 Drug-induced headache, not elsewhere classified, not intractable: Secondary | ICD-10-CM | POA: Diagnosis not present

## 2019-08-13 DIAGNOSIS — Z8249 Family history of ischemic heart disease and other diseases of the circulatory system: Secondary | ICD-10-CM | POA: Diagnosis not present

## 2019-08-13 DIAGNOSIS — Z6841 Body Mass Index (BMI) 40.0 and over, adult: Secondary | ICD-10-CM | POA: Diagnosis not present

## 2019-08-13 DIAGNOSIS — F411 Generalized anxiety disorder: Secondary | ICD-10-CM | POA: Diagnosis present

## 2019-08-13 DIAGNOSIS — Z20828 Contact with and (suspected) exposure to other viral communicable diseases: Secondary | ICD-10-CM | POA: Diagnosis present

## 2019-08-13 DIAGNOSIS — I2510 Atherosclerotic heart disease of native coronary artery without angina pectoris: Secondary | ICD-10-CM

## 2019-08-13 DIAGNOSIS — E785 Hyperlipidemia, unspecified: Secondary | ICD-10-CM | POA: Diagnosis present

## 2019-08-13 DIAGNOSIS — F17210 Nicotine dependence, cigarettes, uncomplicated: Secondary | ICD-10-CM | POA: Diagnosis present

## 2019-08-13 DIAGNOSIS — Z836 Family history of other diseases of the respiratory system: Secondary | ICD-10-CM | POA: Diagnosis not present

## 2019-08-13 DIAGNOSIS — R079 Chest pain, unspecified: Secondary | ICD-10-CM

## 2019-08-13 DIAGNOSIS — F1910 Other psychoactive substance abuse, uncomplicated: Secondary | ICD-10-CM | POA: Diagnosis not present

## 2019-08-13 DIAGNOSIS — J45909 Unspecified asthma, uncomplicated: Secondary | ICD-10-CM | POA: Diagnosis present

## 2019-08-13 DIAGNOSIS — I214 Non-ST elevation (NSTEMI) myocardial infarction: Secondary | ICD-10-CM | POA: Diagnosis present

## 2019-08-13 DIAGNOSIS — Z720 Tobacco use: Secondary | ICD-10-CM | POA: Diagnosis not present

## 2019-08-13 DIAGNOSIS — I10 Essential (primary) hypertension: Secondary | ICD-10-CM

## 2019-08-13 DIAGNOSIS — N179 Acute kidney failure, unspecified: Secondary | ICD-10-CM | POA: Diagnosis present

## 2019-08-13 DIAGNOSIS — K219 Gastro-esophageal reflux disease without esophagitis: Secondary | ICD-10-CM | POA: Diagnosis present

## 2019-08-13 DIAGNOSIS — Z79899 Other long term (current) drug therapy: Secondary | ICD-10-CM | POA: Diagnosis not present

## 2019-08-13 DIAGNOSIS — G4733 Obstructive sleep apnea (adult) (pediatric): Secondary | ICD-10-CM | POA: Diagnosis present

## 2019-08-13 DIAGNOSIS — T463X5A Adverse effect of coronary vasodilators, initial encounter: Secondary | ICD-10-CM | POA: Diagnosis not present

## 2019-08-13 DIAGNOSIS — R55 Syncope and collapse: Secondary | ICD-10-CM | POA: Diagnosis present

## 2019-08-13 HISTORY — PX: LEFT HEART CATH AND CORONARY ANGIOGRAPHY: CATH118249

## 2019-08-13 LAB — CBC
HCT: 41.8 % (ref 39.0–52.0)
Hemoglobin: 13.9 g/dL (ref 13.0–17.0)
MCH: 28.1 pg (ref 26.0–34.0)
MCHC: 33.3 g/dL (ref 30.0–36.0)
MCV: 84.4 fL (ref 80.0–100.0)
Platelets: 185 10*3/uL (ref 150–400)
RBC: 4.95 MIL/uL (ref 4.22–5.81)
RDW: 14.6 % (ref 11.5–15.5)
WBC: 12.1 10*3/uL — ABNORMAL HIGH (ref 4.0–10.5)
nRBC: 0 % (ref 0.0–0.2)

## 2019-08-13 LAB — LIPID PANEL
Cholesterol: 139 mg/dL (ref 0–200)
HDL: 42 mg/dL (ref >40)
LDL Cholesterol: 73 mg/dL (ref 0–99)
Total CHOL/HDL Ratio: 3.3 RATIO
Triglycerides: 118 mg/dL (ref <150)
VLDL: 24 mg/dL (ref 0–40)

## 2019-08-13 LAB — ECHOCARDIOGRAM COMPLETE
Height: 67 in
Weight: 4352.76 oz

## 2019-08-13 LAB — BASIC METABOLIC PANEL
Anion gap: 13 (ref 5–15)
BUN: 9 mg/dL (ref 6–20)
CO2: 20 mmol/L — ABNORMAL LOW (ref 22–32)
Calcium: 8.5 mg/dL — ABNORMAL LOW (ref 8.9–10.3)
Chloride: 107 mmol/L (ref 98–111)
Creatinine, Ser: 1.17 mg/dL (ref 0.61–1.24)
GFR calc Af Amer: >60 mL/min (ref >60)
GFR calc non Af Amer: >60 mL/min (ref >60)
Glucose, Bld: 104 mg/dL — ABNORMAL HIGH (ref 70–99)
Potassium: 3.6 mmol/L (ref 3.5–5.1)
Sodium: 140 mmol/L (ref 135–145)

## 2019-08-13 LAB — TROPONIN I (HIGH SENSITIVITY)
Troponin I (High Sensitivity): 14917 ng/L (ref <18)
Troponin I (High Sensitivity): 15415 ng/L (ref <18)
Troponin I (High Sensitivity): 290 ng/L (ref <18)

## 2019-08-13 LAB — HIV ANTIBODY (ROUTINE TESTING W REFLEX): HIV Screen 4th Generation wRfx: NONREACTIVE

## 2019-08-13 LAB — PROTIME-INR
INR: 1 (ref 0.8–1.2)
INR: 1 (ref 0.8–1.2)
Prothrombin Time: 13.5 seconds (ref 11.4–15.2)
Prothrombin Time: 13.3 seconds (ref 11.4–15.2)

## 2019-08-13 LAB — HEMOGLOBIN A1C
Hgb A1c MFr Bld: 5.6 % (ref 4.8–5.6)
Mean Plasma Glucose: 114.02 mg/dL

## 2019-08-13 LAB — APTT
aPTT: 24 seconds (ref 24–36)
aPTT: 25 seconds (ref 24–36)

## 2019-08-13 LAB — SARS CORONAVIRUS 2 (TAT 6-24 HRS): SARS Coronavirus 2: NEGATIVE

## 2019-08-13 LAB — SARS CORONAVIRUS 2 BY RT PCR (HOSPITAL ORDER, PERFORMED IN ~~LOC~~ HOSPITAL LAB): SARS Coronavirus 2: NEGATIVE

## 2019-08-13 SURGERY — LEFT HEART CATH AND CORONARY ANGIOGRAPHY
Anesthesia: Moderate Sedation

## 2019-08-13 MED ORDER — SODIUM CHLORIDE 0.9 % IV SOLN: INTRAVENOUS | Status: DC | PRN

## 2019-08-13 MED ORDER — CLOPIDOGREL BISULFATE 75 MG PO TABS
300.0000 mg | ORAL_TABLET | Freq: Once | ORAL | Status: AC
  Administered 2019-08-13: 300 mg via ORAL
  Filled 2019-08-13: qty 4

## 2019-08-13 MED ORDER — PRASUGREL HCL 10 MG PO TABS
ORAL_TABLET | ORAL | Status: AC
  Filled 2019-08-13: qty 6

## 2019-08-13 MED ORDER — SODIUM CHLORIDE 0.9 % WEIGHT BASED INFUSION: 1.0000 mL/kg/h | INTRAVENOUS | Status: DC

## 2019-08-13 MED ORDER — KETOROLAC TROMETHAMINE 30 MG/ML IJ SOLN
30.0000 mg | Freq: Three times a day (TID) | INTRAMUSCULAR | Status: AC | PRN
  Administered 2019-08-14: 30 mg via INTRAVENOUS
  Filled 2019-08-13: qty 1

## 2019-08-13 MED ORDER — VERAPAMIL HCL 2.5 MG/ML IV SOLN
INTRAVENOUS | Status: AC
  Filled 2019-08-13: qty 2

## 2019-08-13 MED ORDER — TIROFIBAN HCL IV 12.5 MG/250 ML
INTRAVENOUS | Status: AC
  Filled 2019-08-13: qty 250

## 2019-08-13 MED ORDER — HEPARIN (PORCINE) 25000 UT/250ML-% IV SOLN
1250.0000 [IU]/h | INTRAVENOUS | Status: DC
  Administered 2019-08-13: 1250 [IU]/h via INTRAVENOUS
  Filled 2019-08-13: qty 250

## 2019-08-13 MED ORDER — TICAGRELOR 90 MG PO TABS
ORAL_TABLET | ORAL | Status: AC
  Filled 2019-08-13: qty 2

## 2019-08-13 MED ORDER — LABETALOL HCL 5 MG/ML IV SOLN
10.0000 mg | INTRAVENOUS | Status: AC | PRN
  Administered 2019-08-13: 04:00:00 10 mg via INTRAVENOUS

## 2019-08-13 MED ORDER — VERAPAMIL HCL 2.5 MG/ML IV SOLN
INTRAVENOUS | Status: DC | PRN
  Administered 2019-08-13: 10 mL via INTRA_ARTERIAL

## 2019-08-13 MED ORDER — NITROGLYCERIN IN D5W 200-5 MCG/ML-% IV SOLN
0.0000 ug/min | INTRAVENOUS | Status: DC
  Administered 2019-08-13: 5 ug/min via INTRAVENOUS
  Filled 2019-08-13: qty 250

## 2019-08-13 MED ORDER — SODIUM CHLORIDE 0.9% FLUSH
3.0000 mL | Freq: Two times a day (BID) | INTRAVENOUS | Status: DC
  Administered 2019-08-13 – 2019-08-14 (×4): 3 mL via INTRAVENOUS

## 2019-08-13 MED ORDER — CHLORHEXIDINE GLUCONATE CLOTH 2 % EX PADS
6.0000 | MEDICATED_PAD | Freq: Every day | CUTANEOUS | Status: DC
  Administered 2019-08-14: 6 via TOPICAL

## 2019-08-13 MED ORDER — SODIUM CHLORIDE 0.9% FLUSH: 3.0000 mL | Freq: Two times a day (BID) | INTRAVENOUS | Status: DC

## 2019-08-13 MED ORDER — HEPARIN SODIUM (PORCINE) 1000 UNIT/ML IJ SOLN
INTRAMUSCULAR | Status: DC | PRN
  Administered 2019-08-13: 4000 [IU] via INTRAVENOUS

## 2019-08-13 MED ORDER — SODIUM CHLORIDE 0.9% FLUSH: 3.0000 mL | INTRAVENOUS | Status: DC | PRN

## 2019-08-13 MED ORDER — LOSARTAN POTASSIUM 50 MG PO TABS
50.0000 mg | ORAL_TABLET | Freq: Every day | ORAL | Status: DC
  Administered 2019-08-13 – 2019-08-14 (×2): 50 mg via ORAL
  Filled 2019-08-13 (×2): qty 1

## 2019-08-13 MED ORDER — LABETALOL HCL 5 MG/ML IV SOLN
INTRAVENOUS | Status: AC
  Administered 2019-08-13: 10 mg via INTRAVENOUS
  Filled 2019-08-13: qty 4

## 2019-08-13 MED ORDER — IOHEXOL 300 MG/ML  SOLN
INTRAMUSCULAR | Status: DC | PRN
  Administered 2019-08-13: 50 mL

## 2019-08-13 MED ORDER — HYDRALAZINE HCL 20 MG/ML IJ SOLN
10.0000 mg | INTRAMUSCULAR | Status: AC | PRN
  Administered 2019-08-13: 10 mg via INTRAVENOUS
  Filled 2019-08-13: qty 1

## 2019-08-13 MED ORDER — HEPARIN BOLUS VIA INFUSION
2000.0000 [IU] | Freq: Once | INTRAVENOUS | Status: AC
  Administered 2019-08-13: 2000 [IU] via INTRAVENOUS
  Filled 2019-08-13: qty 2000

## 2019-08-13 MED ORDER — HEPARIN (PORCINE) IN NACL 1000-0.9 UT/500ML-% IV SOLN
INTRAVENOUS | Status: AC
  Filled 2019-08-13: qty 1000

## 2019-08-13 MED ORDER — FENTANYL CITRATE (PF) 100 MCG/2ML IJ SOLN
INTRAMUSCULAR | Status: DC | PRN
  Administered 2019-08-13: 12.5 ug via INTRAVENOUS

## 2019-08-13 MED ORDER — ATORVASTATIN CALCIUM 80 MG PO TABS
80.0000 mg | ORAL_TABLET | Freq: Every day | ORAL | Status: DC
  Administered 2019-08-13 – 2019-08-14 (×2): 80 mg via ORAL
  Filled 2019-08-13: qty 1
  Filled 2019-08-13: qty 4

## 2019-08-13 MED ORDER — ONDANSETRON HCL 4 MG/2ML IJ SOLN
INTRAMUSCULAR | Status: AC
  Filled 2019-08-13: qty 2

## 2019-08-13 MED ORDER — BIVALIRUDIN TRIFLUOROACETATE 250 MG IV SOLR
INTRAVENOUS | Status: AC
  Filled 2019-08-13: qty 250

## 2019-08-13 MED ORDER — ENOXAPARIN SODIUM 30 MG/0.3ML ~~LOC~~ SOLN: 30.0000 mg | SUBCUTANEOUS | Status: DC

## 2019-08-13 MED ORDER — MIDAZOLAM HCL 2 MG/2ML IJ SOLN
INTRAMUSCULAR | Status: DC | PRN
  Administered 2019-08-13: 0.5 mg via INTRAVENOUS

## 2019-08-13 MED ORDER — SODIUM CHLORIDE 0.9 % IV SOLN: 250.0000 mL | INTRAVENOUS | Status: DC | PRN

## 2019-08-13 MED ORDER — HEPARIN (PORCINE) 25000 UT/250ML-% IV SOLN
1500.0000 [IU]/h | INTRAVENOUS | Status: DC
  Administered 2019-08-13: 1100 [IU]/h via INTRAVENOUS
  Administered 2019-08-14 – 2019-08-15 (×2): 1500 [IU]/h via INTRAVENOUS
  Filled 2019-08-13 (×3): qty 250

## 2019-08-13 MED ORDER — NITROGLYCERIN 1 MG/10 ML FOR IR/CATH LAB
INTRA_ARTERIAL | Status: AC
  Filled 2019-08-13: qty 10

## 2019-08-13 MED ORDER — CLOPIDOGREL BISULFATE 300 MG PO TABS
ORAL_TABLET | ORAL | Status: AC
  Filled 2019-08-13: qty 2

## 2019-08-13 MED ORDER — CLOPIDOGREL BISULFATE 75 MG PO TABS
75.0000 mg | ORAL_TABLET | Freq: Every day | ORAL | Status: DC
  Administered 2019-08-14 – 2019-08-15 (×2): 75 mg via ORAL
  Filled 2019-08-13 (×2): qty 1

## 2019-08-13 MED ORDER — HEPARIN (PORCINE) IN NACL 1000-0.9 UT/500ML-% IV SOLN
INTRAVENOUS | Status: DC | PRN
  Administered 2019-08-13: 1000 mL

## 2019-08-13 MED ORDER — ASPIRIN 81 MG PO CHEW
CHEWABLE_TABLET | ORAL | Status: AC
  Administered 2019-08-13: 81 mg
  Filled 2019-08-13: qty 4

## 2019-08-13 MED ORDER — SODIUM CHLORIDE 0.9 % IV SOLN
INTRAVENOUS | Status: DC | PRN
  Administered 2019-08-13: 02:00:00 via INTRAVENOUS

## 2019-08-13 MED ORDER — FENTANYL CITRATE (PF) 100 MCG/2ML IJ SOLN
INTRAMUSCULAR | Status: AC
  Filled 2019-08-13: qty 2

## 2019-08-13 MED ORDER — MIDAZOLAM HCL 2 MG/2ML IJ SOLN
INTRAMUSCULAR | Status: AC
  Filled 2019-08-13: qty 2

## 2019-08-13 MED ORDER — HEPARIN BOLUS VIA INFUSION
4000.0000 [IU] | Freq: Once | INTRAVENOUS | Status: DC
  Filled 2019-08-13: qty 4000

## 2019-08-13 MED ORDER — SODIUM CHLORIDE 0.9 % WEIGHT BASED INFUSION: 3.0000 mL/kg/h | INTRAVENOUS | Status: AC

## 2019-08-13 MED ORDER — HEPARIN SODIUM (PORCINE) 1000 UNIT/ML IJ SOLN
INTRAMUSCULAR | Status: AC
  Filled 2019-08-13: qty 2

## 2019-08-13 MED ORDER — ASPIRIN 81 MG PO CHEW: 81.0000 mg | CHEWABLE_TABLET | ORAL | Status: AC

## 2019-08-13 SURGICAL SUPPLY — 10 items
CATH 5F 110X4 TIG (CATHETERS) ×3 IMPLANT
CATH INFINITI 5FR ANG PIGTAIL (CATHETERS) ×3 IMPLANT
DEVICE INFLAT 30 PLUS (MISCELLANEOUS) IMPLANT
DEVICE RAD COMP TR BAND LRG (VASCULAR PRODUCTS) ×3 IMPLANT
GLIDESHEATH SLEND SS 6F .021 (SHEATH) ×3 IMPLANT
KIT MANI 3VAL PERCEP (MISCELLANEOUS) ×3 IMPLANT
PACK CARDIAC CATH (CUSTOM PROCEDURE TRAY) ×3 IMPLANT
PROTECTION STATION PRESSURIZED (MISCELLANEOUS) ×3
STATION PROTECTION PRESSURIZED (MISCELLANEOUS) ×1 IMPLANT
WIRE ROSEN-J .035X260CM (WIRE) ×3 IMPLANT

## 2019-08-13 NOTE — Progress Notes (Signed)
[place] at [facility]                                                                                                                                                                                  Patient Demographics   Marcus Bauer, is a 38 y.o. male, DOB - 23-Dec-1980, DQQ:[id number]  Admit date - 08/12/2019   Admitting Physician Lang Snow, NP  Outpatient Primary MD for the patient is Dion Body, MD   LOS - 0  Subjective: Patient admitted with chest pain and syncope States his chest pain is improved   Review of Systems:   CONSTITUTIONAL: No documented fever. No fatigue, weakness. No weight gain, no weight loss.  EYES: No blurry or double vision.  ENT: No tinnitus. No postnasal drip. No redness of the oropharynx.  RESPIRATORY: No cough, no wheeze, no hemoptysis. No dyspnea.  CARDIOVASCULAR: Positive for chest pain. No orthopnea. No palpitations. No syncope.  GASTROINTESTINAL: No nausea, no vomiting or diarrhea. No abdominal pain. No melena or hematochezia.  GENITOURINARY: No dysuria or hematuria.  ENDOCRINE: No polyuria or nocturia. No heat or cold intolerance.  HEMATOLOGY: No anemia. No bruising. No bleeding.  INTEGUMENTARY: No rashes. No lesions.  MUSCULOSKELETAL: No arthritis. No swelling. No gout.  NEUROLOGIC: No numbness, tingling, or ataxia. No seizure-type activity.  PSYCHIATRIC: No anxiety. No insomnia. No ADD.    Vitals:   Vitals:   08/13/19 1100 08/13/19 1130 08/13/19 1200 08/13/19 1240  BP: (!) 146/103 (!) 150/95 135/90   Pulse: 70 66 79 66  Resp: (!) 22 17 17 17   Temp:    97.9 F (36.6 C)  TempSrc:    Oral  SpO2:    98%  Weight:      Height:        Wt Readings from Last 3 Encounters:  08/13/19 123.4 kg  12/20/18 124.7 kg  12/13/18 126.6 kg     Intake/Output Summary (Last 24 hours) at 08/13/2019 1322 Last data filed at 08/13/2019 1248 Gross per 24 hour  Intake 1068.22 ml  Output 1050 ml  Net  18.22 ml    Physical Exam:   GENERAL: Pleasant-appearing in no apparent distress.  HEAD, EYES, EARS, NOSE AND THROAT: Atraumatic, normocephalic. Extraocular muscles are intact. Pupils equal and reactive to light. Sclerae anicteric. No conjunctival injection. No oro-pharyngeal erythema.  NECK: Supple. There is no jugular venous distention. No bruits, no lymphadenopathy, no thyromegaly.  HEART: Regular rate and rhythm,. No murmurs, no rubs, no clicks.  LUNGS: Clear to auscultation bilaterally. No rales or rhonchi. No wheezes.  ABDOMEN: Soft, flat, nontender, nondistended. Has good bowel sounds. No hepatosplenomegaly appreciated.  EXTREMITIES: No evidence  of any cyanosis, clubbing, or peripheral edema.  +2 pedal and radial pulses bilaterally.  NEUROLOGIC: The patient is alert, awake, and oriented x3 with no focal motor or sensory deficits appreciated bilaterally.  SKIN: Moist and warm with no rashes appreciated.  Psych: Not anxious, depressed LN: No inguinal LN enlargement    Antibiotics   Anti-infectives (From admission, onward)   None      Medications   Scheduled Meds: . [START ON 08/14/2019] aspirin  81 mg Oral Pre-Cath  . aspirin EC  81 mg Oral Daily  . atorvastatin  80 mg Oral q1800  . Chlorhexidine Gluconate Cloth  6 each Topical Daily  . [START ON 08/14/2019] clopidogrel  75 mg Oral Q breakfast  . nicotine  14 mg Transdermal Daily  . sodium chloride flush  3 mL Intravenous Q12H   Continuous Infusions: . sodium chloride 20 mL/hr at 08/13/19 0140  . sodium chloride    . [START ON 08/14/2019] sodium chloride     Followed by  . [START ON 08/14/2019] sodium chloride    . nitroGLYCERIN 25 mcg/min (08/13/19 1248)   PRN Meds:.sodium chloride, sodium chloride, acetaminophen, morphine injection, nitroGLYCERIN, ondansetron (ZOFRAN) IV, oxyCODONE-acetaminophen, sodium chloride flush   Data Review:   Micro Results Recent Results (from the past 240 hour(s))  SARS CORONAVIRUS  2 (TAT 6-24 HRS) Nasopharyngeal Nasopharyngeal Swab     Status: None   Collection Time: 08/12/19  6:38 PM   Specimen: Nasopharyngeal Swab  Result Value Ref Range Status   SARS Coronavirus 2 NEGATIVE NEGATIVE Final    Comment: (NOTE) SARS-CoV-2 target nucleic acids are NOT DETECTED. The SARS-CoV-2 RNA is generally detectable in upper and lower respiratory specimens during the acute phase of infection. Negative results do not preclude SARS-CoV-2 infection, do not rule out co-infections with other pathogens, and should not be used as the sole basis for treatment or other patient management decisions. Negative results must be combined with clinical observations, patient history, and epidemiological information. The expected result is Negative. Fact Sheet for Patients: HairSlick.no Fact Sheet for Healthcare Providers: quierodirigir.com This test is not yet approved or cleared by the [place] FDA and  has been authorized for detection and/or diagnosis of SARS-CoV-2 by FDA under an Emergency Use Authorization (EUA). This EUA will remain  in effect (meaning this test can be used) for the duration of the COVID-19 declaration under Section 56 4(b)(1) of the Act, 21 U.S.C. section 360bbb-3(b)(1), unless the authorization is terminated or revoked sooner. Performed at [facility], [address] [place], [place] [address]   SARS Coronavirus 2 by RT PCR (hospital order, performed in [facility] hospital lab) Nasopharyngeal Nasopharyngeal Swab     Status: None   Collection Time: 08/13/19  2:00 AM   Specimen: Nasopharyngeal Swab  Result Value Ref Range Status   SARS Coronavirus 2 NEGATIVE NEGATIVE Final    Comment: (NOTE) If result is NEGATIVE SARS-CoV-2 target nucleic acids are NOT DETECTED. The SARS-CoV-2 RNA is generally detectable in upper and lower  respiratory specimens during the acute phase of infection. The lowest   concentration of SARS-CoV-2 viral copies this assay can detect is 250  copies / mL. A negative result does not preclude SARS-CoV-2 infection  and should not be used as the sole basis for treatment or other  patient management decisions.  A negative result may occur with  improper specimen collection / handling, submission of specimen other  than nasopharyngeal swab, presence of viral mutation(s) within the  areas targeted by this assay, and inadequate number of viral copies  (<250 copies / mL). A negative result must be combined with clinical  observations, patient history, and epidemiological information. If result is POSITIVE SARS-CoV-2 target nucleic acids are DETECTED. The SARS-CoV-2 RNA is generally detectable in upper and lower  respiratory specimens dur ing the acute phase of infection.  Positive  results are indicative of active infection with SARS-CoV-2.  Clinical  correlation with patient history and other diagnostic information is  necessary to determine patient infection status.  Positive results do  not rule out bacterial infection or co-infection with other viruses. If result is PRESUMPTIVE POSTIVE SARS-CoV-2 nucleic acids MAY BE PRESENT.   A presumptive positive result was obtained on the submitted specimen  and confirmed on repeat testing.  While 2019 novel coronavirus  (SARS-CoV-2) nucleic acids may be present in the submitted sample  additional confirmatory testing may be necessary for epidemiological  and / or clinical management purposes  to differentiate between  SARS-CoV-2 and other Sarbecovirus currently known to infect humans.  If clinically indicated additional testing with an alternate test  methodology [phone]([id number]) is advised. The SARS-CoV-2 RNA is generally  detectable in upper and lower respiratory sp ecimens during the acute  phase of infection. The expected result is Negative. Fact Sheet for Patients:  BoilerBrush.com.cyhttps://www.fda.gov/media/136312/download Fact Sheet  for Healthcare Providers: https://pope.com/https://www.fda.gov/media/136313/download This test is not yet approved or cleared by the [place] FDA and has been authorized for detection and/or diagnosis of SARS-CoV-2 by FDA under an Emergency Use Authorization (EUA).  This EUA will remain in effect (meaning this test can be used) for the duration of the COVID-19 declaration under Section 564(b)(1) of the Act, 21 U.S.C. section 360bbb-3(b)(1), unless the authorization is terminated or revoked sooner. Performed at [facility], [address] [place], [place] [address]     Radiology Reports Ct Head Wo Contrast  Result Date: 08/12/2019 CLINICAL DATA:  CAD evaluation, syncope, low CHD risk, asymptomatic for ischemia. Additional history provided: Near syncope while working outside. Feels weak. EXAM: CT HEAD WITHOUT CONTRAST TECHNIQUE: Contiguous axial images were obtained from the base of the skull through the vertex without intravenous contrast. COMPARISON:  Head CT 07/19/2009 FINDINGS: Brain: No evidence of acute intracranial hemorrhage. No demarcated cortical infarction. No evidence of intracranial mass. No midline shift or extra-axial fluid collection. Cerebral volume is normal for age. Vascular: No hyperdense vessel. Skull: Normal. Negative for fracture or focal lesion. Sinuses/Orbits: Visualized orbits demonstrate no acute abnormality. Minimal scattered paranasal sinus mucosal thickening at the imaged levels. No significant mastoid effusion. IMPRESSION: No CT evidence of acute intracranial abnormality. Electronically Signed   By: Jackey LogeKyle  Golden DO   On: 08/12/2019 20:49   Ct Angio Chest Pe W Or Wo Contrast  Result Date: 08/12/2019 CLINICAL DATA:  38 year old male with chest pain. EXAM: CT ANGIOGRAPHY CHEST WITH CONTRAST TECHNIQUE: Multidetector CT imaging of the chest was performed using the standard protocol during bolus administration of intravenous contrast. Multiplanar CT image reconstructions  and MIPs were obtained to evaluate the vascular anatomy. CONTRAST:  75mL OMNIPAQUE IOHEXOL 350 MG/ML SOLN COMPARISON:  Chest CT dated 07/19/2009 and radiograph dated 08/12/2019 FINDINGS: Cardiovascular: Borderline cardiomegaly. No pericardial effusion. The thoracic aorta is grossly unremarkable for the degree of opacification. Evaluation of the pulmonary arteries is somewhat limited due to suboptimal opacification and respiratory motion artifact. No large or central pulmonary artery embolus identified. Mediastinum/Nodes: No hilar or mediastinal adenopathy. The esophagus is grossly unremarkable. No mediastinal fluid  collection. Lungs/Pleura: The lungs are clear. There is no pleural effusion or pneumothorax. The central airways are patent. Upper Abdomen: No acute abnormality. Musculoskeletal: No chest wall abnormality. No acute or significant osseous findings. Review of the MIP images confirms the above findings. IMPRESSION: No acute intrathoracic pathology. No CT evidence of central pulmonary artery embolus. Electronically Signed   By: Elgie Collard M.D.   On: 08/12/2019 22:29   US Carotid Bilateral  Result Date: 08/13/2019 CLINICAL DATA:  38 year old male with presyncope EXAM: BILATERAL CAROTID DUPLEX ULTRASOUND TECHNIQUE: Wallace Cullens scale imaging, color Doppler and duplex ultrasound were performed of bilateral carotid and vertebral arteries in the neck. COMPARISON:  None. FINDINGS: Criteria: Quantification of carotid stenosis is based on velocity parameters that correlate the residual internal carotid diameter with NASCET-based stenosis levels, using the diameter of the distal internal carotid lumen as the denominator for stenosis measurement. The following velocity measurements were obtained: RIGHT ICA: 80/22 cm/sec CCA: 74/10 cm/sec SYSTOLIC ICA/CCA RATIO:  1.1 ECA:  112 cm/sec LEFT ICA: 51/14 cm/sec CCA: 100/17 cm/sec SYSTOLIC ICA/CCA RATIO:  0.5 ECA:  107 cm/sec RIGHT CAROTID ARTERY: No significant  atherosclerotic plaque or evidence of stenosis. RIGHT VERTEBRAL ARTERY:  Patent with normal antegrade flow. LEFT CAROTID ARTERY: No significant atherosclerotic plaque or evidence of stenosis. LEFT VERTEBRAL ARTERY:  Patent with normal antegrade flow. IMPRESSION: No evidence of atherosclerotic plaque or stenosis in either internal carotid artery. The vertebral arteries are patent with normal antegrade flow. Signed, Sterling Big, MD, RPVI [facility] Electronically Signed   By: Malachy Moan M.D.   On: 08/13/2019 11:09   Dg Chest Port 1 View  Result Date: 08/12/2019 CLINICAL DATA:  Chest pain and shortness of breath EXAM: PORTABLE CHEST 1 VIEW COMPARISON:  July 20, 2009 FINDINGS: Lungs are clear. Heart size and pulmonary vascularity are normal. No adenopathy. No pneumothorax. No bone lesions. IMPRESSION: No edema or consolidation. Electronically Signed   By: Bretta Bang III M.D.   On: 08/12/2019 17:04     CBC Recent Labs  Lab 08/12/19 1646 08/13/19 0543  WBC 8.6 12.1*  HGB 13.9 13.9  HCT 41.8 41.8  PLT 187 185  MCV 85.3 84.4  MCH 28.4 28.1  MCHC 33.3 33.3  RDW 14.6 14.6    Chemistries  Recent Labs  Lab 08/12/19 1646 08/13/19 0543  NA 141 140  K 3.6 3.6  CL 107 107  CO2 25 20*  GLUCOSE 110* 104*  BUN 12 9  CREATININE 1.84* 1.17  CALCIUM 8.2* 8.5*   ------------------------------------------------------------------------------------------------------------------ estimated creatinine clearance is 110.9 mL/min (by C-G formula based on SCr of 1.17 mg/dL). ------------------------------------------------------------------------------------------------------------------ Recent Labs    08/12/19 1646  HGBA1C 5.6   ------------------------------------------------------------------------------------------------------------------ Recent Labs    08/13/19 0543  CHOL 139  HDL 42  LDLCALC 73  TRIG 118   CHOLHDL 3.3   ------------------------------------------------------------------------------------------------------------------ Recent Labs    08/12/19 1646  TSH 0.371   ------------------------------------------------------------------------------------------------------------------ No results for input(s): VITAMINB12, FOLATE, FERRITIN, TIBC, IRON, RETICCTPCT in the last 72 hours.  Coagulation profile Recent Labs  Lab 08/13/19 0115  INR 1.0    No results for input(s): DDIMER in the last 72 hours.  Cardiac Enzymes No results for input(s): CKMB, TROPONINI, MYOGLOBIN in the last 168 hours.  Invalid input(s): CK ------------------------------------------------------------------------------------------------------------------ Invalid input(s): POCBNP    Assessment & Plan   Patient is 38 year old admitted with chest pain  1.  Chest pain with distal coronary artery disease medical manage I do  not see any urine drug screen on this 38 year old will obtain a urine drug screen  2.  Syncope carotid Dopplers are pending echocardiogram shows no significant abnormality  3.  Essential hypertension blood pressures currently stable  4.  Sleep apnea     Code Status Orders  (From admission, onward)         Start     Ordered   08/12/19 1849  Full code  Continuous     08/12/19 1852        Code Status History    Date Active Date Inactive Code Status Order ID Comments User Context   12/20/2018 1919 12/20/2018 2253 Full Code [id number]  Poggi, Excell Seltzer, MD Inpatient   Advance Care Planning Activity    Advance Directive Documentation     Most Recent Value  Type of Advance Directive  Living will  Pre-existing out of facility DNR order (yellow form or pink MOST form)  -  "MOST" Form in Place?  -           Consults cardiology  DVT Prophylaxis patient is ambulatory  Lab Results  Component Value Date   PLT 185 08/13/2019     Time Spent in minutes 35 minutes greater  than 50% of time spent in care coordination and counseling patient regarding the condition and plan of care.   Auburn Bilberry M.D on 08/13/2019 at 1:22 PM  Between 7am to 6pm - Pager - [phone]  After 6pm go to [email or web address] - Social research officer, government  [employer]   Office  [phone]

## 2019-08-13 NOTE — Progress Notes (Addendum)
Subjective: Per nursing report patient complained of 10 out of 10 left chest pain radiating to his neck.  Was very restless in the bed.  No associated symptoms of shortness of breath, diaphoresis, nausea or vomiting, abdominal pain or any other cardiac related symptoms.  Objective: He remains afebrile with blood pressure 132/78 mm Hg and pulse rate 91 beats/min. There were no focal neurological deficits; he is alert but complaining of chest pressure and discomfort.  Assessment:37 y.o. male  with pertinent past medical history of hypertension, OSA not on CPAP, tobacco abuse, GERD, and asthma presenting to the ED with chief complaints of chest pain and near syncopal episode.  Plan -Administer 4 mg of morphine stat -Obtain stat EKG and repeat troponin -Obtain CTA chest rule out PE  Addendum: Nursing reports at 0035 that patient was asleep following morphine administration but did mention that he still has 3 out of 10 chest pressure.  Reviewed patient's prior ECG noted ST elevation in the inferior lead change from ST depression prior EKG. Also notified of elevated troponin from 6 to 290 by nursing staff at 0035.  Given this finding, patient was started on heparin drip and cardiologist on-call Dr. Saunders Revel paged for further management.  Advised to call STEMI.  We will keep patient n.p.o. for possible Cath Lab.    Rufina Falco, DNP, CCRN, FNP-BC [employer] Nurse Practitioner Between 7am to 6pm - Pager [phone]  After 6pm go to [email or web address] - Proofreader  [employer]  [phone]

## 2019-08-13 NOTE — Plan of Care (Signed)
  Problem: Education: Goal: Knowledge of General Education information will improve Description: Including pain rating scale, medication(s)/side effects and non-pharmacologic comfort measures Outcome: Progressing   Problem: Pain Managment: Goal: General experience of comfort will improve Outcome: Progressing   Problem: Safety: Goal: Ability to remain free from injury will improve Outcome: Progressing   

## 2019-08-13 NOTE — Consult Note (Signed)
Cardiology Consultation:   Patient ID: Marcus Bauer MRN: [id number]; DOB: [birth year]  Admit date: 08/12/2019 Date of Consult: 08/13/2019  Primary Care Provider: Dion Body, MD Primary Cardiologist: New - Ruthel Martine Primary Electrophysiologist:  None    Patient Profile:   Marcus Bauer is a 38 y.o. male with a hx of hypertension, sleep apnea, GERD, asthma, tobacco use who is being seen today for the evaluation of chest pain and abnormal EKG at the request of Ms Stark Klein.  History of Present Illness:   Marcus Bauer was in his usual state of health until approximately 3 PM yesterday when he had acute onset of left-sided chest pressure rating to the left arm.  It was accompanied by shortness of breath and lightheadedness.  He did not pass out completely but thought that he might.  Subsequently presented to the emergency department, where he continued to have up to 8/10 chest pain.  He received sublingual nitroglycerin and IV morphine with improvement in pain, though it has continued to wax and wane in intensity.  Currently it is 3/10.  He denies a history of prior cardiac disease.  He has not been taking medications regularly, including for his hypertension.  CTA of the chest was negative for PE or other acute intrathoracic pathology.  Heart Pathway Score:     Past Medical History:  Diagnosis Date   Anxiety    NO MEDS   Asthma    WELL CONTROLLED   GERD (gastroesophageal reflux disease)    OCC TUMS   Hypertension    Sleep apnea    WILL OCC USE NOT EVERY DAY    Past Surgical History:  Procedure Laterality Date   CARPOMETACARPAL ([place]) FUSION OF THUMB Left 12/20/2018   Procedure: REPAIR OF THE ULNAR COLLATERAL LIGAMENT OF THUMB;  Surgeon: Corky Mull, MD;  Location: [facility] ORS;  Service: Orthopedics;  Laterality: Left;  ARTHREX SMALL JOINT INTERNAL BRACE KIT   SHOULDER SURGERY Left      Home Medications:  Prior to Admission medications   Medication Sig Start Date Marcus Bauer Date  Taking? Authorizing Provider  losartan (COZAAR) 50 MG tablet Take 50 mg by mouth daily. 07/24/19 07/23/20 Yes [provider]  albuterol (PROVENTIL HFA;VENTOLIN HFA) 108 (90 Base) MCG/ACT inhaler Inhale 2 puffs into the lungs every 6 (six) hours as needed for wheezing or shortness of breath.    [provider]  calcium carbonate (TUMS - DOSED IN MG ELEMENTAL CALCIUM) 500 MG chewable tablet Chew 1 tablet by mouth as needed for indigestion or heartburn.    [provider]    Inpatient Medications: Scheduled Meds:  aspirin EC  81 mg Oral Daily   atorvastatin  20 mg Oral q1800   morphine       nicotine  14 mg Transdermal Daily   Continuous Infusions:  sodium chloride 999 mL/hr at 08/12/19 2030   sodium chloride 20 mL/hr at 08/13/19 0136   heparin 1,250 Units/hr (08/13/19 0123)   PRN Meds: sodium chloride, acetaminophen, morphine injection, nitroGLYCERIN, nitroGLYCERIN, ondansetron (ZOFRAN) IV, oxyCODONE-acetaminophen  Allergies:    Allergies  Allergen Reactions   Shellfish Allergy Shortness Of Breath   Hydrochlorothiazide Other (See Comments)    Intolerance - malaise   Other Hives    CLOROX    Social History:   Social History   Tobacco Use   Smoking status: Current Every Day Smoker    Packs/day: 1.00    Years: 18.00    Pack years: 18.00    Types:  Cigarettes   Smokeless tobacco: Current User    Types: Chew  Substance Use Topics   Alcohol use: Yes    Comment: OCC-weekends, more than 6/day   Drug use: Never     Family History:   Mother has a history of coronary artery disease, having her first stent placed in her early 61s.  She also had a history of lung disease.  ROS:  Please see the history of present illness. All other ROS reviewed and negative.     Physical Exam/Data:   Vitals:   08/12/19 2058 08/12/19 2141 08/13/19 0000 08/13/19 0120  BP: 121/81 132/78  (!) 143/98  Pulse: 85 63  72  Resp: 16   20  Temp: 98.2 F (36.8  C)   98.6 F (37 C)  TempSrc:    Oral  SpO2: 95%   98%  Weight:   121.2 kg   Height:   _0  (1.702 m)     Intake/Output Summary (Last 24 hours) at 08/13/2019 0155 Last data filed at 08/12/2019 2130 Gross per 24 hour  Intake 999 ml  Output --  Net 999 ml   Last 3 Weights 08/13/2019 08/12/2019 12/20/2018  Weight (lbs) 267 lb 4.8 oz 267 lb 275 lb  Weight (kg) 121.246 kg 121.11 kg 124.739 kg     Body mass index is 41.87 kg/m.  General:  Well nourished, well developed, in no acute distress HEENT: normal Lymph: no adenopathy Neck: no JVD Endocrine:  No thryomegaly Vascular: No carotid bruits; FA pulses 2+ bilaterally without bruits  Cardiac:  normal S1, S2; RRR; no murmurs, rubs, or gallops Lungs:  clear to auscultation bilaterally, no wheezing, rhonchi or rales  Abd: soft, nontender, no hepatomegaly  Ext: no edema Musculoskeletal:  No deformities, BUE and BLE strength normal and equal Skin: warm and dry  Neuro:  CNs 2-12 intact, no focal abnormalities noted Psych:  Normal affect   EKG:  The EKG at 1:26 AM was personally reviewed and demonstrates: Normal sinus rhythm with nonspecific ST changes.  ST elevation in the inferior leads noted on prior tracing performed yesterday at 9:29 PM.  Relevant CV Studies: None.  Laboratory Data:  High Sensitivity Troponin:   Recent Labs  Lab 08/12/19 1646 08/12/19 2221 08/12/19 2358  TROPONINIHS _1 290*     Chemistry Recent Labs  Lab 08/12/19 1646  NA 141  K 3.6  CL 107  CO2 25  GLUCOSE 110*  BUN 12  CREATININE 1.84*  CALCIUM 8.2*  GFRNONAA 46*  GFRAA 54*  ANIONGAP 9    No results for input(s): PROT, ALBUMIN, AST, ALT, ALKPHOS, BILITOT in the last 168 hours. Hematology Recent Labs  Lab 08/12/19 1646  WBC 8.6  RBC 4.90  HGB 13.9  HCT 41.8  MCV 85.3  MCH 28.4  MCHC 33.3  RDW 14.6  PLT 187   BNPNo results for input(s): BNP, PROBNP in the last 168 hours.  DDimer No results for input(s): DDIMER in the last  168 hours.   Radiology/Studies:  Ct Head Wo Contrast  Result Date: 08/12/2019 CLINICAL DATA:  CAD evaluation, syncope, low CHD risk, asymptomatic for ischemia. Additional history provided: Near syncope while working outside. Feels weak. EXAM: CT HEAD WITHOUT CONTRAST TECHNIQUE: Contiguous axial images were obtained from the base of the skull through the vertex without intravenous contrast. COMPARISON:  Head CT 07/19/2009 FINDINGS: Brain: No evidence of acute intracranial hemorrhage. No demarcated cortical infarction. No evidence of intracranial mass. No midline  shift or extra-axial fluid collection. Cerebral volume is normal for age. Vascular: No hyperdense vessel. Skull: Normal. Negative for fracture or focal lesion. Sinuses/Orbits: Visualized orbits demonstrate no acute abnormality. Minimal scattered paranasal sinus mucosal thickening at the imaged levels. No significant mastoid effusion. IMPRESSION: No CT evidence of acute intracranial abnormality. Electronically Signed   By: Kellie Simmering DO   On: 08/12/2019 20:49   Ct Angio Chest Pe W Or Wo Contrast  Result Date: 08/12/2019 CLINICAL DATA:  38 year old male with chest pain. EXAM: CT ANGIOGRAPHY CHEST WITH CONTRAST TECHNIQUE: Multidetector CT imaging of the chest was performed using the standard protocol during bolus administration of intravenous contrast. Multiplanar CT image reconstructions and MIPs were obtained to evaluate the vascular anatomy. CONTRAST:  84m OMNIPAQUE IOHEXOL 350 MG/ML SOLN COMPARISON:  Chest CT dated 07/19/2009 and radiograph dated 08/12/2019 FINDINGS: Cardiovascular: Borderline cardiomegaly. No pericardial effusion. The thoracic aorta is grossly unremarkable for the degree of opacification. Evaluation of the pulmonary arteries is somewhat limited due to suboptimal opacification and respiratory motion artifact. No large or central pulmonary artery embolus identified. Mediastinum/Nodes: No hilar or mediastinal adenopathy. The  esophagus is grossly unremarkable. No mediastinal fluid collection. Lungs/Pleura: The lungs are clear. There is no pleural effusion or pneumothorax. The central airways are patent. Upper Abdomen: No acute abnormality. Musculoskeletal: No chest wall abnormality. No acute or significant osseous findings. Review of the MIP images confirms the above findings. IMPRESSION: No acute intrathoracic pathology. No CT evidence of central pulmonary artery embolus. Electronically Signed   By: AAnner CreteM.D.   On: 08/12/2019 22:29   Dg Chest Port 1 View  Result Date: 08/12/2019 CLINICAL DATA:  Chest pain and shortness of breath EXAM: PORTABLE CHEST 1 VIEW COMPARISON:  July 20, 2009 FINDINGS: Lungs are clear. Heart size and pulmonary vascularity are normal. No adenopathy. No pneumothorax. No bone lesions. IMPRESSION: No edema or consolidation. Electronically Signed   By: WLowella GripIII M.D.   On: 08/12/2019 17:04    Assessment and Plan:   High risk NSTEMI: Patient has acute onset of chest pain at 3 PM with near syncope.  Pain has not resolved despite medical therapy.  CT of the chest was negative for acute intrathoracic pathology.  EKG obtained at 9:29 PM was concerning for inferior STEMI, though baseline wander makes interpretation difficult.  Repeat EKG obtained when I was informed of the patient, shows nonspecific ST segment changes but does not meet STEMI criteria.  However, in the setting of ongoing chest pain unrelieved by medical therapy (nitroglycerin and morphine), I have recommended proceeding with urgent cardiac catheterization.  We will need to be careful with IV contrast, given renal insufficiency and contrast exposure for CTA earlier today.  I have reviewed the risks, indications, and alternatives to cardiac catheterization, possible angioplasty, and stenting with the patient. Risks include but are not limited to bleeding, infection, vascular injury, stroke, myocardial infection, arrhythmia,  kidney injury, radiation-related injury in the case of prolonged fluoroscopy use, emergency cardiac surgery, and death. The patient understands the risks of serious complication is 1-2 in 19735with diagnostic cardiac cath and 1-2% or less with angioplasty/stenting.  Continue high intensity statin therapy, aspirin, and heparin pending catheterization.  Hypertension: Blood pressure normal to mildly elevated.  Medication adjustments to be made following catheterization.  Acute kidney injury: Creatinine noted to be 1.8 on admission, up from 1.3 on last check on 07/24/2019.  We will hydrate the patient gently and try to minimize contrast exposure during the procedure.  Morbid obesity: Weight loss encouraged.  Polysubstance abuse: Urine drug screen notable for marijuana.  Patient also endorses tobacco and alcohol use.  Recommend abstinence.      For questions or updates, please contact [place] Please consult [email or web address] for contact info under     Signed, Nelva Bush, MD  08/13/2019 1:55 AM

## 2019-08-13 NOTE — Progress Notes (Deleted)
PHARMACIST - PHYSICIAN COMMUNICATION  CONCERNING:  Enoxaparin (Lovenox) for DVT Prophylaxis    RECOMMENDATION: Patient was prescribed enoxaprin 30mg  q24 hours for VTE prophylaxis.   Filed Weights   08/12/19 1638 08/13/19 0000 08/13/19 0335  Weight: 267 lb (121.1 kg) 267 lb 4.8 oz (121.2 kg) 272 lb 0.8 oz (123.4 kg)    Body mass index is 42.61 kg/m.  Estimated Creatinine Clearance: 110.9 mL/min (by C-G formula based on SCr of 1.17 mg/dL).   Based on [place] patient is candidate for enoxaparin 40mg  every 12 hour dosing due to BMI being >40.   DESCRIPTION: Pharmacy has adjusted enoxaparin dose per [facility] policy.  Patient is now receiving enoxaparin 40mg  every 12 hours.    Rowland Lathe, PharmD Clinical Pharmacist  08/13/2019 5:08 PM

## 2019-08-13 NOTE — Progress Notes (Signed)
39 yo male admitted to the telemetry unit on 10/24 with atypical left sided chest pain with radiation to the left arm and shortness of breath. CTA Chest negative for PE or acute intrathoracic pathology.  Initially medically managed.  However, due to unresolved chest pain and EKG concerning for possible inferior STEMI pt transported emergently to cardiac cath lab on 10/25.  Cardiac cath revealed distal rPDA lesion to small/distal for PCI, there cardiology planning to medically manage detailed cardiac cath findings below.  Pt admitted to ICU post cath for additional observation and treatment. Pt currently alert and oriented, vss, and nsr on cardiac monitor.  PCCM team not officially consulted but will assist with plan of care as needed.  Cardiac cath revealed two-vessel coronary artery disease involving small to moderate sized branches, with occlusion of distal rPDA (culprit lesion) and 60% ostial OMA stenosis. Low normal left ventricular systolic function ([id number]%) with inferior hypokinesis. Moderately elevated left ventricular filling pressure.  Marda Stalker, [place] Pager [phone] (please enter 7 digits) PCCM Consult Pager [phone] (please enter 7 digits)

## 2019-08-13 NOTE — Progress Notes (Signed)
ANTICOAGULATION CONSULT NOTE - Initial Consult  Pharmacy Consult for heparin Indication: chest pain/ACS  Allergies  Allergen Reactions  . Shellfish Allergy Shortness Of Breath  . Hydrochlorothiazide Other (See Comments)    Intolerance - malaise  . Other Hives    CLOROX    Patient Measurements: Height: 5\' 7"  (170.2 cm) Weight: 267 lb 4.8 oz (121.2 kg) IBW/kg (Calculated) : 66.1 Heparin Dosing Weight: 88 kg  Vital Signs: Temp: 98.2 F (36.8 C) (10/24 2058) Temp Source: Oral (10/24 1636) BP: 132/78 (10/24 2141) Pulse Rate: 63 (10/24 2141)  Labs: Recent Labs    08/12/19 1646 08/12/19 2221 08/12/19 2358  HGB 13.9  --   --   HCT 41.8  --   --   PLT 187  --   --   CREATININE 1.84*  --   --   TROPONINIHS 7  8 6  290*    Estimated Creatinine Clearance: 69.8 mL/min (A) (by C-G formula based on SCr of 1.84 mg/dL (H)).   Medical History: Past Medical History:  Diagnosis Date  . Anxiety    NO MEDS  . Asthma    WELL CONTROLLED  . GERD (gastroesophageal reflux disease)    OCC TUMS  . Hypertension   . Sleep apnea    WILL OCC USE NOT EVERY DAY    Medications:  Scheduled:  . aspirin EC  81 mg Oral Daily  . atorvastatin  20 mg Oral q1800  . heparin  2,000 Units Intravenous Once  . morphine      . nicotine  14 mg Transdermal Daily    Assessment: Patient admitted tonight x CP w/o any cardiac h/o, patient has a h/o of OSA on CPAP, tobacco use, asthma and obesity, had sudden onset CP w/ N/V w/ symptoms of chest tightness and lightheadedness w/ pain radiating to L arm. Initial trop was 6 - 8 but rose to 290, EKG initially showing ST depression, but now ST elevation in anterior leads. Patient is being started on heparin drip for NSTEMI/ possible STEMI. No anticoagulation PTA.  Goal of Therapy:  Heparin level 0.3-0.7 units/ml Monitor platelets by anticoagulation protocol: Yes   Plan:  Patient received a dose of lovenox 40 mg subq x 1 for VTE prophylaxis, order has  been d/c'd. Since patient had an acute rise in trops will give a half-bolus of heparin 2000 units IV x 1 Will start rate at 1250 units/hr  Baseline labs pending, initial CBC WNL. Will check anti-Xa @ 0700. Will monitor daily CBC's and adjust per anti-Xa levels.  Tobie Lords, PharmD, BCPS Clinical Pharmacist 08/13/2019,1:02 AM

## 2019-08-13 NOTE — Progress Notes (Signed)
[place] for heparin Indication: chest pain/ACS  Allergies  Allergen Reactions  . Shellfish Allergy Shortness Of Breath  . Hydrochlorothiazide Other (See Comments)    Intolerance - malaise  . Other Hives    CLOROX    Patient Measurements: Height: 5\' 7"  (170.2 cm) Weight: 272 lb 0.8 oz (123.4 kg) IBW/kg (Calculated) : 66.1 Heparin Dosing Weight: 94.9 kg  Vital Signs: Temp: 99.8 F (37.7 C) (10/25 1940) Temp Source: Axillary (10/25 1940) BP: 124/83 (10/25 2000) Pulse Rate: 68 (10/25 2000)  Labs: Recent Labs    08/12/19 1646 08/12/19 2221 08/12/19 2358 08/13/19 0115 08/13/19 0543 08/13/19 1843  HGB 13.9  --   --   --  13.9  --   HCT 41.8  --   --   --  41.8  --   PLT 187  --   --   --  185  --   APTT  --   --   --  24  --   --   LABPROT  --   --   --  13.5  --   --   INR  --   --   --  1.0  --   --   CREATININE 1.84*  --   --   --  1.17  --   TROPONINIHS 7  8 6  290*  --   --  14,917*    Estimated Creatinine Clearance: 110.9 mL/min (by C-G formula based on SCr of 1.17 mg/dL).   Medical History: Past Medical History:  Diagnosis Date  . Anxiety    NO MEDS  . Asthma    WELL CONTROLLED  . GERD (gastroesophageal reflux disease)    OCC TUMS  . Hypertension   . Sleep apnea    WILL OCC USE NOT EVERY DAY    Medications:  Scheduled:  . [START ON 08/14/2019] aspirin  81 mg Oral Pre-Cath  . aspirin EC  81 mg Oral Daily  . atorvastatin  80 mg Oral q1800  . Chlorhexidine Gluconate Cloth  6 each Topical Daily  . [START ON 08/14/2019] clopidogrel  75 mg Oral Q breakfast  . losartan  50 mg Oral Daily  . nicotine  14 mg Transdermal Daily  . sodium chloride flush  3 mL Intravenous Q12H    Assessment: Patient admitted tonight x CP w/o any cardiac h/o, patient has a h/o of OSA on CPAP, tobacco use, asthma and obesity, had sudden onset CP w/ N/V w/ symptoms of chest tightness and lightheadedness w/ pain radiating to L arm.  Initial trop was 6 - 8 but rose to 290. Currently, it is 14917. EKG initially showing ST depression, but now ST elevation in anterior leads. Patient was previously started on heparin drip for NSTEMI/ possible STEMI but was taken off the drip and started on DAPT. The last infusion was started 0.125 today and stopped at 0335. No anticoagulation PTA.  Patient had urgent cardiac catheterization today.  Goal of Therapy:  Heparin level 0.3-0.7 units/ml Monitor platelets by anticoagulation protocol: Yes   Plan:  Baseline labs have been ordered  Heparin DW: 94.9 kg Per Dr. Garen Lah, no bolus is needed.  Start heparin infusion at 1100 units/hr Check anti-Xa level in 6 hours and daily while on heparin, per protocol Continue to monitor H&H and platelets  Kristeen Miss, PharmD Clinical Pharmacist 08/13/2019,8:33 PM

## 2019-08-13 NOTE — Progress Notes (Signed)
   08/13/19 0100  Clinical Encounter Type  Visited With Patient not available;Health care provider  Visit Type Code   Chaplain received a code STEMI page for the patient. Upon arrival, the Care Team was assessing the patient and administering care. This chaplain maintained pastoral presence outside the patient's room and offered silent prayer. The patient was heard talking with Care Team and the Cardiologist as he received an update on his condition. No family present at this time. The patient is headed to the Cath Lab. Will follow-up.

## 2019-08-13 NOTE — Progress Notes (Addendum)
CRITICAL VALUE ALERT  Critical Value:  Troponin of 14,917  Date & Time Notied:  08/13/2019 @2006   Provider Notified: Dr. Lanney Gins and Dr. Garen Lah  Orders Received/Actions taken: Start Imdur 30 mg if patient starts having chest pain, if not just continue to monitor.   2014 Dr. Garen Lah called back and asked for patient to be started on heparin per pharmacy protocol.

## 2019-08-13 NOTE — Consult Note (Signed)
CRITICAL CARE PROGRESS NOTE    Name: Marcus Bauer MRN: [id number] DOB: 11-Dec-1980     LOS: 0   SUBJECTIVE FINDINGS & SIGNIFICANT EVENTS   Patient description:  This is a pleasant 38 year old male with a history of essential hypertension reports inconsistent use of blood pressure regimen, obesity, OSA on CPAP at home, chronic shoulder pain, came in due to typical cardiac chest pain associated with acute SOB and presyncope had CT PE protocol in the ED which was negative for acute venous pulmonary thromboembolism found to have NSTEMI status post left heart cath with two-vessel CAD and occlusion of distal right PDA with 60% ostial or may stenosis as well as low to normal LV systolic function with a EF of 50-55 and inferior hypokinesis no PCI done and plan for medical management post procedure.  Hospitalist service consulted critical care due to patient continued complaints of chest pain requiring morphine throughout the day as well as accelerated hypertension when decreasing nitroglycerin drip.    Lines / Drains: PIV x2  Cultures / Sepsis markers: None  Antibiotics: None   Protocols / Consultants: Cardiology and critical care as well as hospitalist team    PAST MEDICAL HISTORY   Past Medical History:  Diagnosis Date   Anxiety    NO MEDS   Asthma    WELL CONTROLLED   GERD (gastroesophageal reflux disease)    OCC TUMS   Hypertension    Sleep apnea    WILL OCC USE NOT EVERY DAY     SURGICAL HISTORY   Past Surgical History:  Procedure Laterality Date   CARPOMETACARPAL (Reader) FUSION OF THUMB Left 12/20/2018   Procedure: REPAIR OF THE ULNAR COLLATERAL LIGAMENT OF THUMB;  Surgeon: Corky Mull, MD;  Location: [facility] ORS;  Service: Orthopedics;  Laterality: Left;  ARTHREX SMALL JOINT INTERNAL BRACE  KIT   SHOULDER SURGERY Left      FAMILY HISTORY   History reviewed. No pertinent family history.   SOCIAL HISTORY   Social History   Tobacco Use   Smoking status: Current Every Day Smoker    Packs/day: 1.00    Years: 18.00    Pack years: 18.00    Types: Cigarettes   Smokeless tobacco: Current User    Types: Chew  Substance Use Topics   Alcohol use: Yes    Comment: OCC-weekends, more than 6/day   Drug use: Never     MEDICATIONS   Current Medication:  Current Facility-Administered Medications:  TORADOL) 30 MG/ML injection 30 mg, 30 mg, Intravenous, Q8H PRN, Patel, Shreyang, MD °•  morphine 2 MG/ML injection 2 mg, 2 mg, Intravenous, Q3H PRN, End, Christopher, MD, 2 mg at 08/13/19 0654 °•  nicotine (NICODERM CQ - dosed in mg/24 hours) patch 14 mg, 14 mg, Transdermal, Daily, End, Christopher, MD, 14 mg at 08/13/19 0005 °•  nitroGLYCERIN (NITROSTAT) SL tablet 0.4 mg, 0.4 mg,  Sublingual, Q5 Min x 3 PRN, End, Christopher, MD, 0.4 mg at 08/12/19 2141 °•  ondansetron (ZOFRAN) injection 4 mg, 4 mg, Intravenous, Q6H PRN, End, Christopher, MD °•  oxyCODONE-acetaminophen (PERCOCET/ROXICET) 5-325 MG per tablet 1 tablet, 1 tablet, Oral, Q6H PRN, End, Christopher, MD, 1 tablet at 08/13/19 1243 °•  sodium chloride flush (NS) 0.9 % injection 3 mL, 3 mL, Intravenous, Q12H, End, Christopher, MD, 3 mL at 08/13/19 1028 °•  sodium chloride flush (NS) 0.9 % injection 3 mL, 3 mL, Intravenous, PRN, End, Christopher, MD ° ° ° °ALLERGIES  ° °Shellfish allergy, Hydrochlorothiazide, and Other ° ° ° °REVIEW OF SYSTEMS  ° ° ° °10 point ROS conducted and is negative except for chest pain ranging between 2-8 out of 10 as well as headache previously 10 out of 10 now improved ° °PHYSICAL EXAMINATION  ° °Vital Signs: °Temp:  [97.9 °F (36.6 °C)-98.9 °F (37.2 °C)] 97.9 °F (36.6 °C) (10/25 1240) °Pulse Rate:  [60-86] 61 (10/25 1600) °Resp:  [13-25] 14 (10/25 1600) °BP: (117-168)/(72-119) 150/95 (10/25 1430) °SpO2:  [95 %-100 %] 98 % (10/25 1240) °Weight:  [121.2 kg-123.4 kg] 123.4 kg (10/25 0335) ° °GENERAL: Mild distress due to chest pain and headache °HEAD: Normocephalic, atraumatic.  °EYES: Pupils equal, round, reactive to light.  No scleral icterus.  °MOUTH: Moist mucosal membrane. °NECK: Supple. No thyromegaly. No nodules. No JVD.  °PULMONARY: Clear to auscultation bilaterally °CARDIOVASCULAR: S1 and S2. Regular rate and rhythm. No murmurs, rubs, or gallops.  °GASTROINTESTINAL: Soft, nontender, non-distended. No masses. Positive bowel sounds. No hepatosplenomegaly.  °MUSCULOSKELETAL: No swelling, clubbing, or edema.  °NEUROLOGIC: Mild distress due to acute illness °SKIN:intact,warm,dry ° ° °PERTINENT DATA  ° ° ° °Infusions: °• sodium chloride 20 mL/hr at 08/13/19 0140  °• sodium chloride    °• [START ON 08/14/2019] sodium chloride    ° Followed by  °• [START ON 08/14/2019] sodium chloride    ° °Scheduled  Medications: °• [START ON 08/14/2019] aspirin  81 mg Oral Pre-Cath  °• aspirin EC  81 mg Oral Daily  °• atorvastatin  80 mg Oral q1800  °• Chlorhexidine Gluconate Cloth  6 each Topical Daily  °• [START ON 08/14/2019] clopidogrel  75 mg Oral Q breakfast  °• nicotine  14 mg Transdermal Daily  °• sodium chloride flush  3 mL Intravenous Q12H  ° °PRN Medications: °sodium chloride, sodium chloride, acetaminophen, ketorolac, morphine injection, nitroGLYCERIN, ondansetron (ZOFRAN) IV, oxyCODONE-acetaminophen, sodium chloride flush °Hemodynamic parameters: °  °Intake/Output: °10/24 0701 - 10/25 0700 °In: 1031.2 [I.V.:1031.2] °Out: 650 [Urine:650]  °Ventilator  Settings: °  ° ° °LAB RESULTS: ° °Basic Metabolic Panel: °Recent Labs  °Lab 08/12/19 °1646 08/13/19 °0543  °NA 141 140  °K 3.6 3.6  °CL 107 107  °CO2 25 20*  °GLUCOSE 110* 104*  °BUN 12 9  °CREATININE 1.84* 1.17  °CALCIUM 8.2* 8.5*  ° °Liver Function Tests: °No results for input(s): AST, ALT, ALKPHOS, BILITOT, PROT, ALBUMIN in the last 168 hours. °No results for input(s): LIPASE, AMYLASE in the last 168 hours. °No results for input(s): AMMONIA in   the last 168 hours. °CBC: °Recent Labs  °Lab 08/12/19 °1646 08/13/19 °0543  °WBC 8.6 12.1*  °HGB 13.9 13.9  °HCT 41.8 41.8  °MCV 85.3 84.4  °PLT 187 185  ° °Cardiac Enzymes: °No results for input(s): CKTOTAL, CKMB, CKMBINDEX, TROPONINI in the last 168 hours. °BNP: °Invalid input(s): POCBNP °CBG: °Recent Labs  °Lab 08/12/19 °2146  °GLUCAP 146*  ° ° ° °IMAGING RESULTS: ° °Imaging: °Ct Head Wo Contrast ° °Result Date: 08/12/2019 °CLINICAL DATA:  CAD evaluation, syncope, low CHD risk, asymptomatic for ischemia. Additional history provided: Near syncope while working outside. Feels weak. EXAM: CT HEAD WITHOUT CONTRAST TECHNIQUE: Contiguous axial images were obtained from the base of the skull through the vertex without intravenous contrast. COMPARISON:  Head CT 07/19/2009 FINDINGS: Brain: No evidence of acute intracranial  hemorrhage. No demarcated cortical infarction. No evidence of intracranial mass. No midline shift or extra-axial fluid collection. Cerebral volume is normal for age. Vascular: No hyperdense vessel. Skull: Normal. Negative for fracture or focal lesion. Sinuses/Orbits: Visualized orbits demonstrate no acute abnormality. Minimal scattered paranasal sinus mucosal thickening at the imaged levels. No significant mastoid effusion. IMPRESSION: No CT evidence of acute intracranial abnormality. Electronically Signed   By: Kyle  Golden DO   On: 08/12/2019 20:49  ° °Ct Angio Chest Pe W Or Wo Contrast ° °Result Date: 08/12/2019 °CLINICAL DATA:  37-year-old male with chest pain. EXAM: CT ANGIOGRAPHY CHEST WITH CONTRAST TECHNIQUE: Multidetector CT imaging of the chest was performed using the standard protocol during bolus administration of intravenous contrast. Multiplanar CT image reconstructions and MIPs were obtained to evaluate the vascular anatomy. CONTRAST:  75mL OMNIPAQUE IOHEXOL 350 MG/ML SOLN COMPARISON:  Chest CT dated 07/19/2009 and radiograph dated 08/12/2019 FINDINGS: Cardiovascular: Borderline cardiomegaly. No pericardial effusion. The thoracic aorta is grossly unremarkable for the degree of opacification. Evaluation of the pulmonary arteries is somewhat limited due to suboptimal opacification and respiratory motion artifact. No large or central pulmonary artery embolus identified. Mediastinum/Nodes: No hilar or mediastinal adenopathy. The esophagus is grossly unremarkable. No mediastinal fluid collection. Lungs/Pleura: The lungs are clear. There is no pleural effusion or pneumothorax. The central airways are patent. Upper Abdomen: No acute abnormality. Musculoskeletal: No chest wall abnormality. No acute or significant osseous findings. Review of the MIP images confirms the above findings. IMPRESSION: No acute intrathoracic pathology. No CT evidence of central pulmonary artery embolus. Electronically Signed   By:  Arash  Radparvar M.D.   On: 08/12/2019 22:29  ° °Us Carotid Bilateral ° °Result Date: 08/13/2019 °CLINICAL DATA:  37-year-old male with presyncope EXAM: BILATERAL CAROTID DUPLEX ULTRASOUND TECHNIQUE: Gray scale imaging, color Doppler and duplex ultrasound were performed of bilateral carotid and vertebral arteries in the neck. COMPARISON:  None. FINDINGS: Criteria: Quantification of carotid stenosis is based on velocity parameters that correlate the residual internal carotid diameter with NASCET-based stenosis levels, using the diameter of the distal internal carotid lumen as the denominator for stenosis measurement. The following velocity measurements were obtained: RIGHT ICA: 80/22 cm/sec CCA: 74/10 cm/sec SYSTOLIC ICA/CCA RATIO:  1.1 ECA:  112 cm/sec LEFT ICA: 51/14 cm/sec CCA: 100/17 cm/sec SYSTOLIC ICA/CCA RATIO:  0.5 ECA:  107 cm/sec RIGHT CAROTID ARTERY: No significant atherosclerotic plaque or evidence of stenosis. RIGHT VERTEBRAL ARTERY:  Patent with normal antegrade flow. LEFT CAROTID ARTERY: No significant atherosclerotic plaque or evidence of stenosis. LEFT VERTEBRAL ARTERY:  Patent with normal antegrade flow. IMPRESSION: No evidence of atherosclerotic plaque or stenosis in either internal carotid artery. The vertebral arteries are patent with normal   antegrade flow. Signed, Heath K. McCullough, MD, RPVI [facility] Electronically Signed   By: Heath  McCullough M.D.   On: 08/13/2019 11:09  ° °Dg Chest Port 1 View ° °Result Date: 08/12/2019 °CLINICAL DATA:  Chest pain and shortness of breath EXAM: PORTABLE CHEST 1 VIEW COMPARISON:  July 20, 2009 FINDINGS: Lungs are clear. Heart size and pulmonary vascularity are normal. No adenopathy. No pneumothorax. No bone lesions. IMPRESSION: No edema or consolidation. Electronically Signed   By: William  Woodruff III M.D.   On: 08/12/2019 17:04  ° ° °  °ASSESSMENT AND PLAN  ° ° °-Multidisciplinary rounds held  today ° °Acute non-STEMI elevation ° -Cardiology on case-appreciate recommendations °Left heart cath report: °1. with occlusion of distal rPDA (culprit lesion) and 60% ostial OMA stenosis. °2. Low normal left ventricular systolic function (LVEF 50-55%) with inferior hypokinesis. °3. Moderately elevated left ventricular filling pressure. °-Aggressive secondary prevention °-Chest pain-May use as needed morphine 2 mg every 3 to 4 hours as well as nitroglycerin drip °-Troponin trend ° °Accelerated hypertension °  -Restart home regimen °  -Partially due to headache and chest pain °-oxygen as needed °-Nitroglycerin drip °-Lasix as tolerated °-follow up cardiac biomarkers ICU monitoring ° ° ° °Renal Failure-most likely transient post left heart cath °-Resolved °-follow chem 7 °-follow UO °-continue Foley Catheter-assess need daily ° ° °Drug abuse ° -Monitor for signs of withdrawal °-Unreliable EtOH history ° ° °Obstructive sleep apnea ° -May use CPAP nightly with home settings °-use vasopressors to keep MAP>65 ° ° °ID °-continue IV abx as prescibed °-follow up cultures ° °GI/Nutrition °GI PROPHYLAXIS as indicated-Lovenox subcu °DIET-->TF's as tolerated °Constipation protocol as indicated ° °ENDO °- ICU hypoglycemic\Hyperglycemia protocol °-check FSBS per protocol ° ° °ELECTROLYTES °-follow labs as needed °-replace as needed °-pharmacy consultation ° ° °DVT/GI PRX ordered °-SCDs  °TRANSFUSIONS AS NEEDED °MONITOR FSBS °ASSESS the need for LABS as needed ° ° °Critical care provider statement:  °  Critical care time (minutes):  32 °  Critical care time was exclusive of:  Separately billable procedures and treating other patients °  Critical care was necessary to treat or prevent imminent or life-threatening deterioration of the following conditions:   Acute non-STEMI, chest pain, headache, accelerated hypertension, morbid obesity, obstructive sleep apnea, drug abuse history with possible withdrawal syndrome °  Critical care  was time spent personally by me on the following activities:  Development of treatment plan with patient or surrogate, discussions with consultants, evaluation of patient's response to treatment, examination of patient, obtaining history from patient or surrogate, ordering and performing treatments and interventions, ordering and review of laboratory studies and re-evaluation of patient's condition.  I assumed direction of critical care for this patient from another provider in my specialty: no   ° °This document was prepared using Dragon voice recognition software and may include unintentional dictation errors. ° ° ° ° , M.D.  °Division of Pulmonary & Critical Care Medicine  °[facility]  ° ° ° °                                                                                                ° ° ° °

## 2019-08-13 NOTE — Progress Notes (Addendum)
Marcus Bauer call a code stemi on pt, protocol for code stemi was implemented. Dr. END came and explain pt about heart catherization. Pt was prep for heart catherization. Pt left the floor to go to heart cath.   0120: Pt notified wife.   Update 0156: Call report Tobie Lords on cath lab.

## 2019-08-14 ENCOUNTER — Encounter: Payer: Self-pay | Admitting: Internal Medicine

## 2019-08-14 ENCOUNTER — Inpatient Hospital Stay: Payer: No Typology Code available for payment source

## 2019-08-14 DIAGNOSIS — Z720 Tobacco use: Secondary | ICD-10-CM

## 2019-08-14 LAB — BASIC METABOLIC PANEL
Anion gap: 8 (ref 5–15)
BUN: 9 mg/dL (ref 6–20)
CO2: 26 mmol/L (ref 22–32)
Calcium: 8.5 mg/dL — ABNORMAL LOW (ref 8.9–10.3)
Chloride: 106 mmol/L (ref 98–111)
Creatinine, Ser: 1.16 mg/dL (ref 0.61–1.24)
GFR calc Af Amer: >60 mL/min (ref >60)
GFR calc non Af Amer: >60 mL/min (ref >60)
Glucose, Bld: 108 mg/dL — ABNORMAL HIGH (ref 70–99)
Potassium: 3.7 mmol/L (ref 3.5–5.1)
Sodium: 140 mmol/L (ref 135–145)

## 2019-08-14 LAB — CBC
HCT: 41.9 % (ref 39.0–52.0)
Hemoglobin: 13.9 g/dL (ref 13.0–17.0)
MCH: 28.1 pg (ref 26.0–34.0)
MCHC: 33.2 g/dL (ref 30.0–36.0)
MCV: 84.6 fL (ref 80.0–100.0)
Platelets: 180 10*3/uL (ref 150–400)
RBC: 4.95 MIL/uL (ref 4.22–5.81)
RDW: 14.6 % (ref 11.5–15.5)
WBC: 10.9 10*3/uL — ABNORMAL HIGH (ref 4.0–10.5)
nRBC: 0 % (ref 0.0–0.2)

## 2019-08-14 LAB — HEPARIN LEVEL (UNFRACTIONATED)
Heparin Unfractionated: 0.1 IU/mL — ABNORMAL LOW (ref 0.30–0.70)
Heparin Unfractionated: 0.24 IU/mL — ABNORMAL LOW (ref 0.30–0.70)
Heparin Unfractionated: 0.4 IU/mL (ref 0.30–0.70)

## 2019-08-14 LAB — TROPONIN I (HIGH SENSITIVITY): Troponin I (High Sensitivity): 10791 ng/L (ref <18)

## 2019-08-14 LAB — GLUCOSE, CAPILLARY: Glucose-Capillary: 102 mg/dL — ABNORMAL HIGH (ref 70–99)

## 2019-08-14 MED ORDER — ISOSORBIDE MONONITRATE ER 30 MG PO TB24
30.0000 mg | ORAL_TABLET | Freq: Every day | ORAL | Status: DC
  Administered 2019-08-14 – 2019-08-15 (×2): 30 mg via ORAL
  Filled 2019-08-14 (×2): qty 1

## 2019-08-14 MED ORDER — POTASSIUM CHLORIDE 20 MEQ PO PACK
40.0000 meq | PACK | Freq: Once | ORAL | Status: AC
  Administered 2019-08-14: 40 meq via ORAL
  Filled 2019-08-14: qty 2

## 2019-08-14 MED ORDER — CARVEDILOL 6.25 MG PO TABS
6.2500 mg | ORAL_TABLET | Freq: Two times a day (BID) | ORAL | Status: DC
  Administered 2019-08-14 – 2019-08-15 (×3): 6.25 mg via ORAL
  Filled 2019-08-14 (×3): qty 1

## 2019-08-14 MED ORDER — HYDROMORPHONE HCL 1 MG/ML IJ SOLN: 2.0000 mg | INTRAMUSCULAR | Status: DC | PRN

## 2019-08-14 MED ORDER — HEPARIN BOLUS VIA INFUSION
3000.0000 [IU] | Freq: Once | INTRAVENOUS | Status: AC
  Administered 2019-08-14: 3000 [IU] via INTRAVENOUS
  Filled 2019-08-14: qty 3000

## 2019-08-14 NOTE — Progress Notes (Signed)
Nutrition Brief Note  Patient identified on the Malnutrition Screening Tool (MST) Report  Wt Readings from Last 15 Encounters:  08/13/19 123.4 kg  12/20/18 124.7 kg  12/13/18 126.6 kg    Met with patient at bedside. He reports he has a good appetite and intake that is unchanged from baseline. He is eating 100% of his meals here and reports he is still hungry. RD will order double protein portions with lunch and dinner. He reports several years ago he used to weigh around 310 lbs and lost weight after eating smaller portions at meals. He has now been stable at around 265-270 lbs. He eats about 2 meals per day. He usually has a meat with sides or sandwich. 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 No subcutaneous fat or muscle wasting on nutrition-focused physical exam. Patient does not meet criteria for malnutrition. RD also consulted for diet education - note to follow.  Body mass index is 42.61 kg/m. Patient meets criteria for obesity class III based on current BMI.   Current diet order is heart healthy, patient is consuming approximately 100% of meals at this time. Labs and medications reviewed.   No nutrition interventions warranted at this time. If nutrition issues arise, please consult RD.   Willey Blade, MS, [place], LDN Office: [phone] Pager: [phone] After Hours/Weekend Pager: [phone]

## 2019-08-14 NOTE — Progress Notes (Signed)
Cardiovascular and Pulmonary Nurse Navigator Note:   38 year old male with hx of HTN, OSA, GERD, asthma, ongoing tobacco abuse who presented to the ED with c/o chest pain and abnormal EKG.  Patient underwent Cardiac Catheterization on 08/13/2019.    Procedures  LEFT HEART CATH AND CORONARY ANGIOGRAPHY  Conclusion  Conclusions: 1. Two-vessel coronary artery disease involving small to moderate sized branches, with occlusion of distal rPDA (culprit lesion) and 60% ostial OMA stenosis. 2. Low normal left ventricular systolic function (LVEF 84-53%) with inferior hypokinesis. 3. Moderately elevated left ventricular filling pressure.  Recommendations: 1. Medical therapy for NSTEMI; distal rPDA lesion is too small/distal for PCI.  Restart heparin 2 hours after TR band deflation, to complete 48 hour course.  I will also load with clopidogrel 300 mg daily and continue dual antiplatelet therapy with aspirin and clopidogrel for 12 months. 2. Aggressive secondary prevention, including high-intensity statin therapy, smoking cessation, and blood pressure control. 3. Elevated LVEDP suggests diastolic dysfunction.  If renal function tolerates, consider gentle diuresis tomorrow.  Nelva Bush, MD [facility] Pager: [phone]   Echo performed on 08/13/2019 with EF of 60-65%.  Left ventricle with normal function.  No LVH.  And no diastolic dysfunction.    EDUCATION:   Rounded on patient.  Patient sitting up in recliner chair in room watching TV.   "Heart Attack Bouncing Back" booklet given and reviewed with patient. Discussed the definition of CAD. Reviewed the location of CAD.   ? Discussed modifiable risk factors including controlling blood pressure, cholesterol, and blood sugar; following heart healthy diet; maintaining healthy weight; exercise; and smoking cessation.   ? Discussed cardiac medications including rationale for taking, mechanisms of action, and side effects. Stressed the  importance of taking medications as prescribed.  ? Discussed emergency plan for heart attack symptoms. Patient verbalized understanding of need to call 911 and not to drive himself to ER if having cardiac symptoms / chest pain.    ? Diet of low sodium, low fat, low cholesterol heart healthy diet discussed. Information on diet provided. Handout provided on Heart Healthy Nutrition Therapy.  Dietitian Consultation for heart healhty diet education completed today.   ? Smoking Cessation - Patient is a CURRENT every day smoker. Patient has been advised to quit by his cardiologist.  Patient reports smoking amount  One pack per day.  pack per day. Patient has been smoking since age 23.   Tips on quitting discussed.  Patient informed this RN that he has had a few cravings while here in the hospital.   ? Exercise - Benefits of exercised discussed. Informed patient his cardiologist has referred him to outpatient Cardiac Rehab. An overview of the program was provided. Brochure and informational letter with CPT billing codes given to patient. Patient is interested in participating. Patient stated he has met his out-of-pocket deductible for this year.   ?  Patient appreciative of the information. Will round on patient tomorrow.  Patient for probable discharge tomorrow.   ? Roanna Epley, RN, BSN, [place]  [facility] Cardiac & Pulmonary Rehab  Cardiovascular & Pulmonary Nurse Navigator  Direct Line: [phone]  Department Phone #: [phone] Fax: [phone]  Email Address: [email or web address]

## 2019-08-14 NOTE — Progress Notes (Signed)
ANTICOAGULATION CONSULT NOTE  Pharmacy Consult for heparin Indication: chest pain/ACS  Patient Measurements: Height: 5\' 7"  (170.2 cm) Weight: 272 lb 0.8 oz (123.4 kg) IBW/kg (Calculated) : 66.1 Heparin Dosing Weight: 94.9 kg  Vital Signs: Temp: 98.5 F (36.9 C) (10/26 2053) Temp Source: Oral (10/26 2053) BP: 117/105 (10/26 2056) Pulse Rate: 84 (10/26 2056)  Labs: Recent Labs    08/12/19 1646  08/13/19 0115 08/13/19 0543 08/13/19 1843 08/13/19 2032 08/13/19 2101 08/14/19 0323 08/14/19 0531 08/14/19 1045 08/14/19 2105  HGB 13.9  --   --  13.9  --   --   --  13.9  --   --   --   HCT 41.8  --   --  41.8  --   --   --  41.9  --   --   --   PLT 187  --   --  185  --   --   --  180  --   --   --   APTT  --   --  24  --   --   --  25  --   --   --   --   LABPROT  --   --  13.5  --   --   --  13.3  --   --   --   --   INR  --   --  1.0  --   --   --  1.0  --   --   --   --   HEPARINUNFRC  --   --   --   --   --   --   --  0.10*  --  0.24* 0.40  CREATININE 1.84*  --   --  1.17  --   --   --  1.16  --   --   --   TROPONINIHS 7  8   < >  --   --  14,917* 15,415*  --   --  10,791*  --   --    < > = values in this interval not displayed.    Estimated Creatinine Clearance: 111.9 mL/min (by C-G formula based on SCr of 1.16 mg/dL).   Medical History: Past Medical History:  Diagnosis Date  . Anxiety    NO MEDS  . Asthma    WELL CONTROLLED  . GERD (gastroesophageal reflux disease)    OCC TUMS  . Hypertension   . Sleep apnea    WILL OCC USE NOT EVERY DAY    Medications:  Scheduled:  . aspirin  81 mg Oral Pre-Cath  . aspirin EC  81 mg Oral Daily  . atorvastatin  80 mg Oral q1800  . carvedilol  6.25 mg Oral BID WC  . Chlorhexidine Gluconate Cloth  6 each Topical Daily  . clopidogrel  75 mg Oral Q breakfast  . isosorbide mononitrate  30 mg Oral Daily  . nicotine  14 mg Transdermal Daily  . sodium chloride flush  3 mL Intravenous Q12H    Assessment: Patient admitted  tonight x CP w/o any cardiac h/o, patient has a h/o of OSA on CPAP, tobacco use, asthma and obesity, had sudden onset CP w/ N/V w/ symptoms of chest tightness and lightheadedness w/ pain radiating to L arm. Initial trop was 6 - 8 but rose to 290. Currently, it is 14917. EKG initially showing ST depression, but now ST elevation in anterior leads. Cardiology plans to continue heparin  drip for total of 48 hours for medical management  Goal of Therapy:  Heparin level 0.3-0.7 units/ml Monitor platelets by anticoagulation protocol: Yes   Plan:   10/26 @ 21:05  HL 0.40.  Continue current rate of 1500 units/hr.   Recheck heparin level in 6 hours.  H&H, PLT stable: CBC in am  Stormy Card, [facility] Clinical Pharmacist 08/14/2019,9:40 PM

## 2019-08-14 NOTE — Progress Notes (Signed)
ANTICOAGULATION CONSULT NOTE  Pharmacy Consult for heparin Indication: chest pain/ACS  Patient Measurements: Height: 5\' 7"  (170.2 cm) Weight: 272 lb 0.8 oz (123.4 kg) IBW/kg (Calculated) : 66.1 Heparin Dosing Weight: 94.9 kg  Vital Signs: Temp: 99.8 F (37.7 C) (10/25 1940) Temp Source: Axillary (10/25 1940) BP: 134/86 (10/26 0526) Pulse Rate: 75 (10/26 0526)  Labs: Recent Labs    08/12/19 1646  08/13/19 0115 08/13/19 0543 08/13/19 1843 08/13/19 2032 08/13/19 2101 08/14/19 0323 08/14/19 0531  HGB 13.9  --   --  13.9  --   --   --  13.9  --   HCT 41.8  --   --  41.8  --   --   --  41.9  --   PLT 187  --   --  185  --   --   --  180  --   APTT  --   --  24  --   --   --  25  --   --   LABPROT  --   --  13.5  --   --   --  13.3  --   --   INR  --   --  1.0  --   --   --  1.0  --   --   HEPARINUNFRC  --   --   --   --   --   --   --  0.10*  --   CREATININE 1.84*  --   --  1.17  --   --   --  1.16  --   TROPONINIHS 7  8   < >  --   --  14,917* 15,415*  --   --  10,791*   < > = values in this interval not displayed.    Estimated Creatinine Clearance: 111.9 mL/min (by C-G formula based on SCr of 1.16 mg/dL).   Medical History: Past Medical History:  Diagnosis Date  . Anxiety    NO MEDS  . Asthma    WELL CONTROLLED  . GERD (gastroesophageal reflux disease)    OCC TUMS  . Hypertension   . Sleep apnea    WILL OCC USE NOT EVERY DAY    Medications:  Scheduled:  . aspirin  81 mg Oral Pre-Cath  . aspirin EC  81 mg Oral Daily  . atorvastatin  80 mg Oral q1800  . Chlorhexidine Gluconate Cloth  6 each Topical Daily  . clopidogrel  75 mg Oral Q breakfast  . losartan  50 mg Oral Daily  . nicotine  14 mg Transdermal Daily  . sodium chloride flush  3 mL Intravenous Q12H    Assessment: Patient admitted tonight x CP w/o any cardiac h/o, patient has a h/o of OSA on CPAP, tobacco use, asthma and obesity, had sudden onset CP w/ N/V w/ symptoms of chest tightness and  lightheadedness w/ pain radiating to L arm. Initial trop was 6 - 8 but rose to 290. Currently, it is 14917. EKG initially showing ST depression, but now ST elevation in anterior leads. Cardiology plans to continue heparin drip for total of 48 hours for medical management  Goal of Therapy:  Heparin level 0.3-0.7 units/ml Monitor platelets by anticoagulation protocol: Yes   Plan:   10/26 1045 HL 0.24: subtherapeutic: bolus 3000 units then increase rate to 1500 units/hr  Recheck heparin level 6 hours after rate change  H&H, PLT stable: CBC in am  Vallery Sa, PharmD Clinical  Pharmacist 08/14/2019,7:08 AM

## 2019-08-14 NOTE — Plan of Care (Signed)
Nutrition Education Note  RD consulted for nutrition education regarding a Heart Healthy diet.   Lipid Panel     Component Value Date/Time   CHOL 139 08/13/2019 0543   TRIG 118 08/13/2019 0543   HDL 42 08/13/2019 0543   CHOLHDL 3.3 08/13/2019 0543   VLDL 24 08/13/2019 0543   LDLCALC 73 08/13/2019 0543    RD provided "Heart Healthy Nutrition Therapy" handout from the Academy of Nutrition and Dietetics. Reviewed patient's dietary recall. Provided examples on ways to decrease sodium and fat intake in diet. Discouraged intake of processed foods and use of salt shaker. Encouraged fresh fruits and vegetables as well as whole grain sources of carbohydrates to maximize fiber intake. Teach back method used.  Expect good compliance. 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  Body mass index is 42.61 kg/m. Pt meets criteria for obesity class III based on current BMI.  Current diet order is heart healthy, patient is consuming approximately 100% of meals at this time. Labs and medications reviewed. No further nutrition interventions warranted at this time. RD contact information provided. If additional nutrition issues arise, please re-consult RD.  Willey Blade, MS, [place], LDN Office: [phone] Pager: [phone] After Hours/Weekend Pager: [phone]

## 2019-08-14 NOTE — Progress Notes (Signed)
[employer] [facility]                                                                                                                                                                                  Patient Demographics   Marcus Bauer, is a 38 y.o. male, DOB - [birth year], TKW:[id number]  Admit date - 08/12/2019   Admitting Physician Jimmye Norman, NP  Outpatient Primary MD for the patient is Marisue Ivan, MD   LOS - 1  Subjective: Patient continues to have chest pain but much improved compared to yesterday  Review of Systems:   CONSTITUTIONAL: No documented fever. No fatigue, weakness. No weight gain, no weight loss.  EYES: No blurry or double vision.  ENT: No tinnitus. No postnasal drip. No redness of the oropharynx.  RESPIRATORY: No cough, no wheeze, no hemoptysis. No dyspnea.  CARDIOVASCULAR: Positive for chest pain. No orthopnea. No palpitations. No syncope.  GASTROINTESTINAL: No nausea, no vomiting or diarrhea. No abdominal pain. No melena or hematochezia.  GENITOURINARY: No dysuria or hematuria.  ENDOCRINE: No polyuria or nocturia. No heat or cold intolerance.  HEMATOLOGY: No anemia. No bruising. No bleeding.  INTEGUMENTARY: No rashes. No lesions.  MUSCULOSKELETAL: No arthritis. No swelling. No gout.  NEUROLOGIC: No numbness, tingling, or ataxia. No seizure-type activity.  PSYCHIATRIC: No anxiety. No insomnia. No ADD.    Vitals:   Vitals:   08/14/19 0934 08/14/19 1000 08/14/19 1100 08/14/19 1228  BP:  (!) 132/105  135/85  Pulse:  77 72 73  Resp:  13 20 19   Temp: 98.5 F (36.9 C)     TempSrc: Oral     SpO2:      Weight:      Height:        Wt Readings from Last 3 Encounters:  08/13/19 123.4 kg  12/20/18 124.7 kg  12/13/18 126.6 kg     Intake/Output Summary (Last 24 hours) at 08/14/2019 1233 Last data filed at 08/14/2019 1228 Gross per 24 hour  Intake 338.07 ml  Output 850 ml  Net -511.93 ml    Physical  Exam:   GENERAL: Pleasant-appearing in no apparent distress.  HEAD, EYES, EARS, NOSE AND THROAT: Atraumatic, normocephalic. Extraocular muscles are intact. Pupils equal and reactive to light. Sclerae anicteric. No conjunctival injection. No oro-pharyngeal erythema.  NECK: Supple. There is no jugular venous distention. No bruits, no lymphadenopathy, no thyromegaly.  HEART: Regular rate and rhythm,. No murmurs, no rubs, no clicks.  LUNGS: Clear to auscultation bilaterally. No rales or rhonchi. No wheezes.  ABDOMEN: Soft, flat, nontender, nondistended. Has good bowel sounds. No hepatosplenomegaly appreciated.  EXTREMITIES: No evidence of any cyanosis, clubbing,  or peripheral edema.  +2 pedal and radial pulses bilaterally.  NEUROLOGIC: The patient is alert, awake, and oriented x3 with no focal motor or sensory deficits appreciated bilaterally.  SKIN: Moist and warm with no rashes appreciated.  Psych: Not anxious, depressed LN: No inguinal LN enlargement    Antibiotics   Anti-infectives (From admission, onward)   None      Medications   Scheduled Meds: . aspirin  81 mg Oral Pre-Cath  . aspirin EC  81 mg Oral Daily  . atorvastatin  80 mg Oral q1800  . carvedilol  6.25 mg Oral BID WC  . Chlorhexidine Gluconate Cloth  6 each Topical Daily  . clopidogrel  75 mg Oral Q breakfast  . isosorbide mononitrate  30 mg Oral Daily  . nicotine  14 mg Transdermal Daily  . sodium chloride flush  3 mL Intravenous Q12H   Continuous Infusions: . sodium chloride 20 mL/hr at 08/13/19 0140  . sodium chloride    . sodium chloride    . heparin 1,300 Units/hr (08/14/19 0530)   PRN Meds:.sodium chloride, sodium chloride, acetaminophen, ketorolac, morphine injection, nitroGLYCERIN, ondansetron (ZOFRAN) IV, oxyCODONE-acetaminophen, sodium chloride flush   Data Review:   Micro Results Recent Results (from the past 240 hour(s))  SARS CORONAVIRUS 2 (TAT 6-24 HRS) Nasopharyngeal Nasopharyngeal Swab      Status: None   Collection Time: 08/12/19  6:38 PM   Specimen: Nasopharyngeal Swab  Result Value Ref Range Status   SARS Coronavirus 2 NEGATIVE NEGATIVE Final    Comment: (NOTE) SARS-CoV-2 target nucleic acids are NOT DETECTED. The SARS-CoV-2 RNA is generally detectable in upper and lower respiratory specimens during the acute phase of infection. Negative results do not preclude SARS-CoV-2 infection, do not rule out co-infections with other pathogens, and should not be used as the sole basis for treatment or other patient management decisions. Negative results must be combined with clinical observations, patient history, and epidemiological information. The expected result is Negative. Fact Sheet for Patients: HairSlick.no Fact Sheet for Healthcare Providers: quierodirigir.com This test is not yet approved or cleared by the [place] FDA and  has been authorized for detection and/or diagnosis of SARS-CoV-2 by FDA under an Emergency Use Authorization (EUA). This EUA will remain  in effect (meaning this test can be used) for the duration of the COVID-19 declaration under Section 56 4(b)(1) of the Act, 21 U.S.C. section 360bbb-3(b)(1), unless the authorization is terminated or revoked sooner. Performed at [facility], [address], [place], [place] [address]   SARS Coronavirus 2 by RT PCR (hospital order, performed in [facility] hospital lab) Nasopharyngeal Nasopharyngeal Swab     Status: None   Collection Time: 08/13/19  2:00 AM   Specimen: Nasopharyngeal Swab  Result Value Ref Range Status   SARS Coronavirus 2 NEGATIVE NEGATIVE Final    Comment: (NOTE) If result is NEGATIVE SARS-CoV-2 target nucleic acids are NOT DETECTED. The SARS-CoV-2 RNA is generally detectable in upper and lower  respiratory specimens during the acute phase of infection. The lowest  concentration of SARS-CoV-2 viral copies this assay can  detect is 250  copies / mL. A negative result does not preclude SARS-CoV-2 infection  and should not be used as the sole basis for treatment or other  patient management decisions.  A negative result may occur with  improper specimen collection / handling, submission of specimen other  than nasopharyngeal swab, presence of viral mutation(s) within the  areas targeted by this assay, and inadequate  number of viral copies  (<250 copies / mL). A negative result must be combined with clinical  observations, patient history, and epidemiological information. If result is POSITIVE SARS-CoV-2 target nucleic acids are DETECTED. The SARS-CoV-2 RNA is generally detectable in upper and lower  respiratory specimens dur ing the acute phase of infection.  Positive  results are indicative of active infection with SARS-CoV-2.  Clinical  correlation with patient history and other diagnostic information is  necessary to determine patient infection status.  Positive results do  not rule out bacterial infection or co-infection with other viruses. If result is PRESUMPTIVE POSTIVE SARS-CoV-2 nucleic acids MAY BE PRESENT.   A presumptive positive result was obtained on the submitted specimen  and confirmed on repeat testing.  While 2019 novel coronavirus  (SARS-CoV-2) nucleic acids may be present in the submitted sample  additional confirmatory testing may be necessary for epidemiological  and / or clinical management purposes  to differentiate between  SARS-CoV-2 and other Sarbecovirus currently known to infect humans.  If clinically indicated additional testing with an alternate test  methodology [phone]([id number]) is advised. The SARS-CoV-2 RNA is generally  detectable in upper and lower respiratory sp ecimens during the acute  phase of infection. The expected result is Negative. Fact Sheet for Patients:  BoilerBrush.com.cyhttps://www.fda.gov/media/136312/download Fact Sheet for Healthcare  Providers: https://pope.com/https://www.fda.gov/media/136313/download This test is not yet approved or cleared by the [place] FDA and has been authorized for detection and/or diagnosis of SARS-CoV-2 by FDA under an Emergency Use Authorization (EUA).  This EUA will remain in effect (meaning this test can be used) for the duration of the COVID-19 declaration under Section 564(b)(1) of the Act, 21 U.S.C. section 360bbb-3(b)(1), unless the authorization is terminated or revoked sooner. Performed at [facility], [address] [place], [place] [id number]     Radiology Reports Ct Head Wo Contrast  Result Date: 08/12/2019 CLINICAL DATA:  CAD evaluation, syncope, low CHD risk, asymptomatic for ischemia. Additional history provided: Near syncope while working outside. Feels weak. EXAM: CT HEAD WITHOUT CONTRAST TECHNIQUE: Contiguous axial images were obtained from the base of the skull through the vertex without intravenous contrast. COMPARISON:  Head CT 07/19/2009 FINDINGS: Brain: No evidence of acute intracranial hemorrhage. No demarcated cortical infarction. No evidence of intracranial mass. No midline shift or extra-axial fluid collection. Cerebral volume is normal for age. Vascular: No hyperdense vessel. Skull: Normal. Negative for fracture or focal lesion. Sinuses/Orbits: Visualized orbits demonstrate no acute abnormality. Minimal scattered paranasal sinus mucosal thickening at the imaged levels. No significant mastoid effusion. IMPRESSION: No CT evidence of acute intracranial abnormality. Electronically Signed   By: Jackey LogeKyle  Golden DO   On: 08/12/2019 20:49   Ct Angio Chest Pe W Or Wo Contrast  Result Date: 08/12/2019 CLINICAL DATA:  38 year old male with chest pain. EXAM: CT ANGIOGRAPHY CHEST WITH CONTRAST TECHNIQUE: Multidetector CT imaging of the chest was performed using the standard protocol during bolus administration of intravenous contrast. Multiplanar CT image reconstructions and MIPs were  obtained to evaluate the vascular anatomy. CONTRAST:  75mL OMNIPAQUE IOHEXOL 350 MG/ML SOLN COMPARISON:  Chest CT dated 07/19/2009 and radiograph dated 08/12/2019 FINDINGS: Cardiovascular: Borderline cardiomegaly. No pericardial effusion. The thoracic aorta is grossly unremarkable for the degree of opacification. Evaluation of the pulmonary arteries is somewhat limited due to suboptimal opacification and respiratory motion artifact. No large or central pulmonary artery embolus identified. Mediastinum/Nodes: No hilar or mediastinal adenopathy. The esophagus is grossly unremarkable. No mediastinal fluid collection. Lungs/Pleura: The lungs are clear. There  is no pleural effusion or pneumothorax. The central airways are patent. Upper Abdomen: No acute abnormality. Musculoskeletal: No chest wall abnormality. No acute or significant osseous findings. Review of the MIP images confirms the above findings. IMPRESSION: No acute intrathoracic pathology. No CT evidence of central pulmonary artery embolus. Electronically Signed   By: Elgie Collard M.D.   On: 08/12/2019 22:29   US Carotid Bilateral  Result Date: 08/13/2019 CLINICAL DATA:  38 year old male with presyncope EXAM: BILATERAL CAROTID DUPLEX ULTRASOUND TECHNIQUE: Wallace Cullens scale imaging, color Doppler and duplex ultrasound were performed of bilateral carotid and vertebral arteries in the neck. COMPARISON:  None. FINDINGS: Criteria: Quantification of carotid stenosis is based on velocity parameters that correlate the residual internal carotid diameter with NASCET-based stenosis levels, using the diameter of the distal internal carotid lumen as the denominator for stenosis measurement. The following velocity measurements were obtained: RIGHT ICA: 80/22 cm/sec CCA: 74/10 cm/sec SYSTOLIC ICA/CCA RATIO:  1.1 ECA:  112 cm/sec LEFT ICA: 51/14 cm/sec CCA: 100/17 cm/sec SYSTOLIC ICA/CCA RATIO:  0.5 ECA:  107 cm/sec RIGHT CAROTID ARTERY: No significant atherosclerotic plaque  or evidence of stenosis. RIGHT VERTEBRAL ARTERY:  Patent with normal antegrade flow. LEFT CAROTID ARTERY: No significant atherosclerotic plaque or evidence of stenosis. LEFT VERTEBRAL ARTERY:  Patent with normal antegrade flow. IMPRESSION: No evidence of atherosclerotic plaque or stenosis in either internal carotid artery. The vertebral arteries are patent with normal antegrade flow. Signed, Sterling Big, MD, RPVI [facility] Radiology Electronically Signed   By: Malachy Moan M.D.   On: 08/13/2019 11:09   Dg Chest Port 1 View  Result Date: 08/12/2019 CLINICAL DATA:  Chest pain and shortness of breath EXAM: PORTABLE CHEST 1 VIEW COMPARISON:  July 20, 2009 FINDINGS: Lungs are clear. Heart size and pulmonary vascularity are normal. No adenopathy. No pneumothorax. No bone lesions. IMPRESSION: No edema or consolidation. Electronically Signed   By: Bretta Bang III M.D.   On: 08/12/2019 17:04     CBC Recent Labs  Lab 08/12/19 1646 08/13/19 0543 08/14/19 0323  WBC 8.6 12.1* 10.9*  HGB 13.9 13.9 13.9  HCT 41.8 41.8 41.9  PLT 187 185 180  MCV 85.3 84.4 84.6  MCH 28.4 28.1 28.1  MCHC 33.3 33.3 33.2  RDW 14.6 14.6 14.6    Chemistries  Recent Labs  Lab 08/12/19 1646 08/13/19 0543 08/14/19 0323  NA 141 140 140  K 3.6 3.6 3.7  CL 107 107 106  CO2 25 20* 26  GLUCOSE 110* 104* 108*  BUN CREATININE 1.84* 1.17 1.16  CALCIUM 8.2* 8.5* 8.5*   ------------------------------------------------------------------------------------------------------------------ estimated creatinine clearance is 111.9 mL/min (by C-G formula based on SCr of 1.16 mg/dL). ------------------------------------------------------------------------------------------------------------------ Recent Labs    08/12/19 1646  HGBA1C 5.6    ------------------------------------------------------------------------------------------------------------------ Recent Labs    08/13/19 0543  CHOL 139  HDL 42  LDLCALC 73  TRIG 118  CHOLHDL 3.3   ------------------------------------------------------------------------------------------------------------------ Recent Labs    08/12/19 1646  TSH 0.371   ------------------------------------------------------------------------------------------------------------------ No results for input(s): VITAMINB12, FOLATE, FERRITIN, TIBC, IRON, RETICCTPCT in the last 72 hours.  Coagulation profile Recent Labs  Lab 08/13/19 0115 08/13/19 2101  INR 1.0 1.0    No results for input(s): DDIMER in the last 72 hours.  Cardiac Enzymes No results for input(s): CKMB, TROPONINI, MYOGLOBIN in the last 168 hours.  Invalid input(s): CK ------------------------------------------------------------------------------------------------------------------ Invalid input(s): POCBNP    Assessment & Plan   Patient is 38 year old admitted with  chest pain  1.  NSTEMI  with distal coronary artery disease medical manage Continue aspirin and Plavix Imdur per cardiology As well as low-dose Coreg Recommended heparin to be continued today  2.  Syncope carotid Dopplers showed no significant stenosis   3.  Essential hypertension blood pressures currently stable  4.  Sleep apnea treatment for home     Code Status Orders  (From admission, onward)         Start     Ordered   08/12/19 1849  Full code  Continuous     08/12/19 1852        Code Status History    Date Active Date Inactive Code Status Order ID Comments User Context   12/20/2018 1919 12/20/2018 2253 Full Code [id number]  Poggi, Marshall Cork, MD Inpatient   Advance Care Planning Activity    Advance Directive Documentation     Most Recent Value  Type of Advance Directive  Living will  Pre-existing out of facility DNR order (yellow form or  pink MOST form)  -  "MOST" Form in Place?  -           Consults cardiology  DVT Prophylaxis patient is ambulatory  Lab Results  Component Value Date   PLT 180 08/14/2019     Time Spent in minutes 35 minutes greater than 50% of time spent in care coordination and counseling patient regarding the condition and plan of care.   Dustin Flock M.D on 08/14/2019 at 12:33 PM  Between 7am to 6pm - Pager - [phone]  After 6pm go to [email or web address] - Proofreader  [employer]   Office  [phone]

## 2019-08-14 NOTE — Progress Notes (Signed)
ANTICOAGULATION CONSULT NOTE  Pharmacy Consult for heparin Indication: chest pain/ACS  Allergies  Allergen Reactions  . Shellfish Allergy Shortness Of Breath  . Hydrochlorothiazide Other (See Comments)    Intolerance - malaise  . Other Hives    CLOROX    Patient Measurements: Height: 5\' 7"  (170.2 cm) Weight: 272 lb 0.8 oz (123.4 kg) IBW/kg (Calculated) : 66.1 Heparin Dosing Weight: 94.9 kg  Vital Signs: Temp: 99.8 F (37.7 C) (10/25 1940) Temp Source: Axillary (10/25 1940) BP: 134/95 (10/25 2200) Pulse Rate: 65 (10/25 2200)  Labs: Recent Labs    08/12/19 1646  08/12/19 2358 08/13/19 0115 08/13/19 0543 08/13/19 1843 08/13/19 2032 08/13/19 2101 08/14/19 0323  HGB 13.9  --   --   --  13.9  --   --   --  13.9  HCT 41.8  --   --   --  41.8  --   --   --  41.9  PLT 187  --   --   --  185  --   --   --  180  APTT  --   --   --  24  --   --   --  25  --   LABPROT  --   --   --  13.5  --   --   --  13.3  --   INR  --   --   --  1.0  --   --   --  1.0  --   HEPARINUNFRC  --   --   --   --   --   --   --   --  0.10*  CREATININE 1.84*  --   --   --  1.17  --   --   --  1.16  TROPONINIHS 7  8   < > 290*  --   --  14,917* 15,415*  --   --    < > = values in this interval not displayed.    Estimated Creatinine Clearance: 111.9 mL/min (by C-G formula based on SCr of 1.16 mg/dL).   Medical History: Past Medical History:  Diagnosis Date  . Anxiety    NO MEDS  . Asthma    WELL CONTROLLED  . GERD (gastroesophageal reflux disease)    OCC TUMS  . Hypertension   . Sleep apnea    WILL OCC USE NOT EVERY DAY    Medications:  Scheduled:  . aspirin  81 mg Oral Pre-Cath  . aspirin EC  81 mg Oral Daily  . atorvastatin  80 mg Oral q1800  . Chlorhexidine Gluconate Cloth  6 each Topical Daily  . clopidogrel  75 mg Oral Q breakfast  . heparin  3,000 Units Intravenous Once  . losartan  50 mg Oral Daily  . nicotine  14 mg Transdermal Daily  . sodium chloride flush  3 mL  Intravenous Q12H    Assessment: Patient admitted tonight x CP w/o any cardiac h/o, patient has a h/o of OSA on CPAP, tobacco use, asthma and obesity, had sudden onset CP w/ N/V w/ symptoms of chest tightness and lightheadedness w/ pain radiating to L arm. Initial trop was 6 - 8 but rose to 290. Currently, it is 149[address]. EKG initially showing ST depression, but now ST elevation in anterior leads. Patient was previously started on heparin drip for NSTEMI/ possible STEMI but was taken off the drip and started on DAPT. The last infusion was started 0.125  today and stopped at 0335. No anticoagulation PTA.  Patient had urgent cardiac catheterization today.  Goal of Therapy:  Heparin level 0.3-0.7 units/ml Monitor platelets by anticoagulation protocol: Yes   Plan:  10/26 @ 0330 HL 0.10 subtherapeutic. Will bolus w/ heparin 3000 units IV x 1 and increase rate to 1300 units/hr and will recheck HL @ 1100, CBC stable, but trops continue to rise, will continue to monitor.  Tobie Lords, PharmD, BCPS Clinical Pharmacist 08/14/2019,5:18 AM

## 2019-08-14 NOTE — Progress Notes (Signed)
Progress Note  Patient Name: Marcus Bauer Date of Encounter: 08/14/2019  Primary Cardiologist: New to [facility]- Dr. Saunders Revel  Subjective   Patient states doing okay, has some mild chest pain but not as severe as on admission.  Inpatient Medications    Scheduled Meds: . aspirin  81 mg Oral Pre-Cath  . aspirin EC  81 mg Oral Daily  . atorvastatin  80 mg Oral q1800  . carvedilol  6.25 mg Oral BID WC  . Chlorhexidine Gluconate Cloth  6 each Topical Daily  . clopidogrel  75 mg Oral Q breakfast  . isosorbide mononitrate  30 mg Oral Daily  . nicotine  14 mg Transdermal Daily  . sodium chloride flush  3 mL Intravenous Q12H   Continuous Infusions: . sodium chloride 20 mL/hr at 08/13/19 0140  . sodium chloride    . sodium chloride    . heparin 1,300 Units/hr (08/14/19 0530)   PRN Meds: sodium chloride, sodium chloride, acetaminophen, ketorolac, morphine injection, nitroGLYCERIN, ondansetron (ZOFRAN) IV, oxyCODONE-acetaminophen, sodium chloride flush   Vital Signs    Vitals:   08/14/19 0800 08/14/19 0934 08/14/19 1000 08/14/19 1100  BP: (!) 148/135  (!) 132/105   Pulse: 75  77 72  Resp: 15  13 20   Temp:  98.5 F (36.9 C)    TempSrc:  Oral    SpO2:      Weight:      Height:        Intake/Output Summary (Last 24 hours) at 08/14/2019 1145 Last data filed at 08/14/2019 0900 Gross per 24 hour  Intake 338.07 ml  Output 250 ml  Net 88.07 ml   Last 3 Weights 08/13/2019 08/13/2019 08/12/2019  Weight (lbs) 272 lb 0.8 oz 267 lb 4.8 oz 267 lb  Weight (kg) 123.4 kg 121.246 kg 121.11 kg      Telemetry    Sinus rhythm- Personally Reviewed  ECG    Not obtained today  Physical Exam   GEN: No acute distress.   Neck: No JVD Cardiac: RRR, no murmurs, rubs, or gallops.  Respiratory: Clear to auscultation bilaterally. GI: Soft, nontender, non-distended  MS: No edema; No deformity. Neuro:  Nonfocal  Psych: Normal affect   Labs    High Sensitivity Troponin:   Recent Labs   Lab 08/12/19 2221 08/12/19 2358 08/13/19 1843 08/13/19 2032 08/14/19 0531  TROPONINIHS 6 290* 14,917* 15,415* 10,791*      Chemistry Recent Labs  Lab 08/12/19 1646 08/13/19 0543 08/14/19 0323  NA 141 140 140  K 3.6 3.6 3.7  CL 107 107 106  CO2 25 20* 26  GLUCOSE 110* 104* 108*  BUN 12 9 9   CREATININE 1.84* 1.17 1.16  CALCIUM 8.2* 8.5* 8.5*  GFRNONAA 46* >60 >60  GFRAA 54* >60 >60  ANIONGAP 9 13 8      Hematology Recent Labs  Lab 08/12/19 1646 08/13/19 0543 08/14/19 0323  WBC 8.6 12.1* 10.9*  RBC 4.90 4.95 4.95  HGB 13.9 13.9 13.9  HCT 41.8 41.8 41.9  MCV 85.3 84.4 84.6  MCH 28.4 28.1 28.1  MCHC 33.3 33.3 33.2  RDW 14.6 14.6 14.6  PLT 187 185 180    BNPNo results for input(s): BNP, PROBNP in the last 168 hours.   DDimer No results for input(s): DDIMER in the last 168 hours.   Radiology    Ct Head Wo Contrast  Result Date: 08/12/2019 CLINICAL DATA:  CAD evaluation, syncope, low CHD risk, asymptomatic for ischemia. Additional history provided: Near syncope  while working outside. Feels weak. EXAM: CT HEAD WITHOUT CONTRAST TECHNIQUE: Contiguous axial images were obtained from the base of the skull through the vertex without intravenous contrast. COMPARISON:  Head CT 07/19/2009 FINDINGS: Brain: No evidence of acute intracranial hemorrhage. No demarcated cortical infarction. No evidence of intracranial mass. No midline shift or extra-axial fluid collection. Cerebral volume is normal for age. Vascular: No hyperdense vessel. Skull: Normal. Negative for fracture or focal lesion. Sinuses/Orbits: Visualized orbits demonstrate no acute abnormality. Minimal scattered paranasal sinus mucosal thickening at the imaged levels. No significant mastoid effusion. IMPRESSION: No CT evidence of acute intracranial abnormality. Electronically Signed   By: Jackey LogeKyle  Golden DO   On: 08/12/2019 20:49   Ct Angio Chest Pe W Or Wo Contrast  Result Date: 08/12/2019 CLINICAL DATA:  38 year old  male with chest pain. EXAM: CT ANGIOGRAPHY CHEST WITH CONTRAST TECHNIQUE: Multidetector CT imaging of the chest was performed using the standard protocol during bolus administration of intravenous contrast. Multiplanar CT image reconstructions and MIPs were obtained to evaluate the vascular anatomy. CONTRAST:  75mL OMNIPAQUE IOHEXOL 350 MG/ML SOLN COMPARISON:  Chest CT dated 07/19/2009 and radiograph dated 08/12/2019 FINDINGS: Cardiovascular: Borderline cardiomegaly. No pericardial effusion. The thoracic aorta is grossly unremarkable for the degree of opacification. Evaluation of the pulmonary arteries is somewhat limited due to suboptimal opacification and respiratory motion artifact. No large or central pulmonary artery embolus identified. Mediastinum/Nodes: No hilar or mediastinal adenopathy. The esophagus is grossly unremarkable. No mediastinal fluid collection. Lungs/Pleura: The lungs are clear. There is no pleural effusion or pneumothorax. The central airways are patent. Upper Abdomen: No acute abnormality. Musculoskeletal: No chest wall abnormality. No acute or significant osseous findings. Review of the MIP images confirms the above findings. IMPRESSION: No acute intrathoracic pathology. No CT evidence of central pulmonary artery embolus. Electronically Signed   By: Elgie CollardArash  Radparvar M.D.   On: 08/12/2019 22:29   [place] Carotid Bilateral  Result Date: 08/13/2019 CLINICAL DATA:  38 year old male with presyncope EXAM: BILATERAL CAROTID DUPLEX ULTRASOUND TECHNIQUE: Wallace CullensGray scale imaging, color Doppler and duplex ultrasound were performed of bilateral carotid and vertebral arteries in the neck. COMPARISON:  None. FINDINGS: Criteria: Quantification of carotid stenosis is based on velocity parameters that correlate the residual internal carotid diameter with NASCET-based stenosis levels, using the diameter of the distal internal carotid lumen as the denominator for stenosis measurement. The following velocity  measurements were obtained: RIGHT ICA: 80/22 cm/sec CCA: 74/10 cm/sec SYSTOLIC ICA/CCA RATIO:  1.1 ECA:  112 cm/sec LEFT ICA: 51/14 cm/sec CCA: 100/17 cm/sec SYSTOLIC ICA/CCA RATIO:  0.5 ECA:  107 cm/sec RIGHT CAROTID ARTERY: No significant atherosclerotic plaque or evidence of stenosis. RIGHT VERTEBRAL ARTERY:  Patent with normal antegrade flow. LEFT CAROTID ARTERY: No significant atherosclerotic plaque or evidence of stenosis. LEFT VERTEBRAL ARTERY:  Patent with normal antegrade flow. IMPRESSION: No evidence of atherosclerotic plaque or stenosis in either internal carotid artery. The vertebral arteries are patent with normal antegrade flow. Signed, Sterling BigHeath K. McCullough, MD, RPVI [facility] Electronically Signed   By: Malachy MoanHeath  McCullough M.D.   On: 08/13/2019 11:09   Dg Chest Port 1 View  Result Date: 08/12/2019 CLINICAL DATA:  Chest pain and shortness of breath EXAM: PORTABLE CHEST 1 VIEW COMPARISON:  July 20, 2009 FINDINGS: Lungs are clear. Heart size and pulmonary vascularity are normal. No adenopathy. No pneumothorax. No bone lesions. IMPRESSION: No edema or consolidation. Electronically Signed   By: Bretta BangWilliam  Woodruff III M.D.   On: 08/12/2019 17:04  Cardiac Studies  TTEcho 08/13/2019   1. Left ventricular ejection fraction, by visual estimation, is 60 to 65%. The left ventricle has normal function. Normal left ventricular size. There is no left ventricular hypertrophy.  2. Global right ventricle has normal systolic function.The right ventricular size is normal. Right vetricular wall thickness was not assessed.  3. Left atrial size was normal.  4. Right atrial size was normal.  5. The mitral valve is normal in structure. No evidence of mitral valve regurgitation. No evidence of mitral stenosis.  6. The tricuspid valve is normal in structure. Tricuspid valve regurgitation was not visualized by color flow Doppler.  7. The aortic valve  is tricuspid Aortic valve regurgitation was not visualized by color flow Doppler. Structurally normal aortic valve, with no evidence of sclerosis or stenosis.  8. The pulmonic valve was normal in structure. Pulmonic valve regurgitation is not visualized by color flow Doppler.  9. The inferior vena cava is dilated in size with >50% respiratory variability, suggesting right atrial pressure of 8 mmHg.  LHC 08/13/2019 Conclusions: 1. Two-vessel coronary artery disease involving small to moderate sized branches, with occlusion of distal rPDA (culprit lesion) and 60% ostial 1st OMA stenosis. 2. Low normal left ventricular systolic function (LVEF 50-55%) with inferior hypokinesis. 3. Moderately elevated left ventricular filling pressure.  Recommendations: 1. Medical therapy for NSTEMI; distal rPDA lesion is too small/distal for PCI.  Restart heparin 2 hours after TR band deflation, to complete 48 hour course.  I will also load with clopidogrel 300 mg daily and continue dual antiplatelet therapy with aspirin and clopidogrel for 12 months.  Patient Profile     38 y.o. male with history of hypertension, sleep apnea, tobacco use x20+ years who presents to the hospital due to chest pain and abnormal EKG.  Patient diagnosed with NSTEMI, taken to the Cath Lab with occlusion of distal PDA and collaterals filling from the LAD.  Vessel was not intervened upon and medical management deemed most appropriate.  Assessment & Plan    Patient with some symptoms of chest discomfort, not as severe as on admission.  His high-sensitivity troponins peaked at about 15,000.  His echocardiogram showed normal ejection fraction with normal diastolic function.  1. NSTEMI, CAD -Continue aspirin and Plavix for at least 1 year -Continue high-dose Lipitor -Start Coreg 6.25 twice daily and Imdur 30 mg daily for antianginal therapy -Continue heparin drip for total of 48 hours for medical management due to NSTEMI.  2. HTN -Coreg  and Imdur as above. -Titrate up as needed to manage blood pressure and also chest pain. -Holding home dose losartan for now.  3.  Tobacco use -Cessation advised.      Signed, Debbe Odea, MD  08/14/2019, 11:45 AM

## 2019-08-14 NOTE — Progress Notes (Signed)
Patient c/o headache with no relief despite pain meds. MD notified. Orders given for head CT w/o contrast, 2mg  IV dilaudid q4hr PRN.

## 2019-08-15 DIAGNOSIS — E785 Hyperlipidemia, unspecified: Secondary | ICD-10-CM

## 2019-08-15 DIAGNOSIS — F1910 Other psychoactive substance abuse, uncomplicated: Secondary | ICD-10-CM

## 2019-08-15 LAB — BASIC METABOLIC PANEL
Anion gap: 7 (ref 5–15)
BUN: 12 mg/dL (ref 6–20)
CO2: 26 mmol/L (ref 22–32)
Calcium: 8.6 mg/dL — ABNORMAL LOW (ref 8.9–10.3)
Chloride: 106 mmol/L (ref 98–111)
Creatinine, Ser: 1.19 mg/dL (ref 0.61–1.24)
GFR calc Af Amer: >60 mL/min (ref >60)
GFR calc non Af Amer: >60 mL/min (ref >60)
Glucose, Bld: 106 mg/dL — ABNORMAL HIGH (ref 70–99)
Potassium: 4.1 mmol/L (ref 3.5–5.1)
Sodium: 139 mmol/L (ref 135–145)

## 2019-08-15 LAB — CBC
HCT: 41.6 % (ref 39.0–52.0)
Hemoglobin: 13.6 g/dL (ref 13.0–17.0)
MCH: 28 pg (ref 26.0–34.0)
MCHC: 32.7 g/dL (ref 30.0–36.0)
MCV: 85.6 fL (ref 80.0–100.0)
Platelets: 159 10*3/uL (ref 150–400)
RBC: 4.86 MIL/uL (ref 4.22–5.81)
RDW: 14.5 % (ref 11.5–15.5)
WBC: 9 10*3/uL (ref 4.0–10.5)
nRBC: 0 % (ref 0.0–0.2)

## 2019-08-15 LAB — HEPARIN LEVEL (UNFRACTIONATED): Heparin Unfractionated: 0.34 IU/mL (ref 0.30–0.70)

## 2019-08-15 LAB — MAGNESIUM: Magnesium: 2.1 mg/dL (ref 1.7–2.4)

## 2019-08-15 MED ORDER — LOSARTAN POTASSIUM 50 MG PO TABS: 100.0000 mg | ORAL_TABLET | Freq: Every day | ORAL | 11 refills | Status: DC

## 2019-08-15 MED ORDER — CARVEDILOL 25 MG PO TABS: 25.0000 mg | ORAL_TABLET | Freq: Two times a day (BID) | ORAL | 11 refills | Status: DC

## 2019-08-15 MED ORDER — CARVEDILOL 6.25 MG PO TABS: 6.2500 mg | ORAL_TABLET | Freq: Two times a day (BID) | ORAL | 0 refills | Status: DC

## 2019-08-15 MED ORDER — NITROGLYCERIN 0.4 MG SL SUBL: 0.4000 mg | SUBLINGUAL_TABLET | SUBLINGUAL | 12 refills | Status: DC | PRN

## 2019-08-15 MED ORDER — CLOPIDOGREL BISULFATE 75 MG PO TABS: 75.0000 mg | ORAL_TABLET | Freq: Every day | ORAL | 0 refills | Status: DC

## 2019-08-15 MED ORDER — ATORVASTATIN CALCIUM 80 MG PO TABS: 80.0000 mg | ORAL_TABLET | Freq: Every day | ORAL | 0 refills | Status: DC

## 2019-08-15 MED ORDER — ASPIRIN 81 MG PO TBEC: 81.0000 mg | DELAYED_RELEASE_TABLET | Freq: Every day | ORAL | 0 refills | Status: DC

## 2019-08-15 MED ORDER — OXYCODONE-ACETAMINOPHEN 5-325 MG PO TABS: 1.0000 | ORAL_TABLET | Freq: Four times a day (QID) | ORAL | 0 refills | Status: DC | PRN

## 2019-08-15 MED ORDER — ALPRAZOLAM 0.25 MG PO TABS: 0.2500 mg | ORAL_TABLET | Freq: Three times a day (TID) | ORAL | 0 refills | Status: DC | PRN

## 2019-08-15 NOTE — Progress Notes (Signed)
Progress Note  Patient Name: Marcus Bauer Date of Encounter: 08/15/2019  Primary Cardiologist: New - [facility]  Subjective   Patient feels better than yesterday.  He has minimal vague upper chest discomfort but no pain.  Biggest concern is headache.  No shortness of breath.  Inpatient Medications    Scheduled Meds:  aspirin EC  81 mg Oral Daily   atorvastatin  80 mg Oral q1800   carvedilol  6.25 mg Oral BID WC   Chlorhexidine Gluconate Cloth  6 each Topical Daily   clopidogrel  75 mg Oral Q breakfast   isosorbide mononitrate  30 mg Oral Daily   nicotine  14 mg Transdermal Daily   sodium chloride flush  3 mL Intravenous Q12H   Continuous Infusions:  sodium chloride 20 mL/hr at 08/13/19 0140   sodium chloride     sodium chloride     heparin 1,500 Units/hr (08/15/19 0317)   PRN Meds: sodium chloride, sodium chloride, acetaminophen, HYDROmorphone (DILAUDID) injection, morphine injection, nitroGLYCERIN, ondansetron (ZOFRAN) IV, oxyCODONE-acetaminophen, sodium chloride flush   Vital Signs    Vitals:   08/14/19 2053 08/14/19 2056 08/15/19 0320 08/15/19 0743  BP: (!) 142/102 (!) 117/105 (!) 125/97 (!) 140/97  Pulse: 77 84 66 71  Resp:    18  Temp: 98.5 F (36.9 C)  98 F (36.7 C) 97.7 F (36.5 C)  TempSrc: Oral  Oral   SpO2: 92% 97% 100% 99%  Weight:   122.5 kg   Height:        Intake/Output Summary (Last 24 hours) at 08/15/2019 1300 Last data filed at 08/15/2019 1057 Gross per 24 hour  Intake 240 ml  Output 450 ml  Net -210 ml   Last 3 Weights 08/15/2019 08/13/2019 08/13/2019  Weight (lbs) 270 lb 272 lb 0.8 oz 267 lb 4.8 oz  Weight (kg) 122.471 kg 123.4 kg 121.246 kg      Telemetry    Sinus rhythm - Personally Reviewed  ECG    No new tracing.  Physical Exam   GEN: No acute distress.   Neck: No JVD Cardiac: RRR, no murmurs, rubs, or gallops.  Respiratory: Clear to auscultation bilaterally. GI: Soft, nontender, non-distended  MS: No  edema; No deformity.  Right radial arteriotomy site well-healed. Neuro:  Nonfocal  Psych: Normal affect   Labs    High Sensitivity Troponin:   Recent Labs  Lab 08/12/19 2221 08/12/19 2358 08/13/19 1843 08/13/19 2032 08/14/19 0531  TROPONINIHS 6 290* 14,917* 15,415* 10,791*      Chemistry Recent Labs  Lab 08/13/19 0543 08/14/19 0323 08/15/19 0312  NA 140 140 139  K 3.6 3.7 4.1  CL 107 106 106  CO2 20* 26 26  GLUCOSE 104* 108* 106*  BUN 9 9 12   CREATININE 1.17 1.16 1.19  CALCIUM 8.5* 8.5* 8.6*  GFRNONAA >60 >60 >60  GFRAA >60 >60 >60  ANIONGAP 13 8 7      Hematology Recent Labs  Lab 08/13/19 0543 08/14/19 0323 08/15/19 0312  WBC 12.1* 10.9* 9.0  RBC 4.95 4.95 4.86  HGB 13.9 13.9 13.6  HCT 41.8 41.9 41.6  MCV 84.4 84.6 85.6  MCH 28.1 28.1 28.0  MCHC 33.3 33.2 32.7  RDW 14.6 14.6 14.5  PLT 185 180 159    BNPNo results for input(s): BNP, PROBNP in the last 168 hours.   DDimer No results for input(s): DDIMER in the last 168 hours.   Radiology    Ct Head Wo Contrast  Result Date:  08/14/2019 CLINICAL DATA:  Dizziness and headache EXAM: CT HEAD WITHOUT CONTRAST TECHNIQUE: Contiguous axial images were obtained from the base of the skull through the vertex without intravenous contrast. COMPARISON:  08/12/2019 head CT FINDINGS: Brain: There is no mass, hemorrhage or extra-axial collection. The size and configuration of the ventricles and extra-axial CSF spaces are normal. The brain parenchyma is normal, without acute or chronic infarction. Vascular: No abnormal hyperdensity of the major intracranial arteries or dural venous sinuses. No intracranial atherosclerosis. Skull: The visualized skull base, calvarium and extracranial soft tissues are normal. Sinuses/Orbits: No fluid levels or advanced mucosal thickening of the visualized paranasal sinuses. No mastoid or middle ear effusion. The orbits are normal. IMPRESSION: Normal head CT. Electronically Signed   By: Ulyses Jarred M.D.   On: 08/14/2019 19:38    Cardiac Studies   TTE (08/13/2019):  1. Left ventricular ejection fraction, by visual estimation, is 60 to 65%. The left ventricle has normal function. Normal left ventricular size. There is no left ventricular hypertrophy.  2. Global right ventricle has normal systolic function.The right ventricular size is normal. Right vetricular wall thickness was not assessed.  3. Left atrial size was normal.  4. Right atrial size was normal.  5. The mitral valve is normal in structure. No evidence of mitral valve regurgitation. No evidence of mitral stenosis.  6. The tricuspid valve is normal in structure. Tricuspid valve regurgitation was not visualized by color flow Doppler.  7. The aortic valve is tricuspid Aortic valve regurgitation was not visualized by color flow Doppler. Structurally normal aortic valve, with no evidence of sclerosis or stenosis.  8. The pulmonic valve was normal in structure. Pulmonic valve regurgitation is not visualized by color flow Doppler.  9. The inferior vena cava is dilated in size with >50% respiratory variability, suggesting right atrial pressure of 8 mmHg.  LHC (08/13/2019): 1. Two-vessel coronary artery disease involving small to moderate sized branches, with occlusion of distal rPDA (culprit lesion) and 60% ostial OMA stenosis. 2. Low normal left ventricular systolic function (LVEF 56-38%) with inferior hypokinesis. 3. Moderately elevated left ventricular filling pressure.  Patient Profile     38 y.o. male with history of untreated hypertension, sleep apnea, GERD, asthma, and tobacco use, admitted with NSTEMI.  Assessment & Plan    NSTEMI: Presentation due to occlusion of distal rPDA, which has been treated medically.  Chest pain has almost completely resolved.  Unfortunately, Mr. Tantillo has significant headache with isosorbide mononitrate.  He has completed greater than 48 hours of IV heparin and also remains on aspirin +  clopidogrel.  Dual antiplatelet therapy with aspirin and clopidogrel for 12 months.  Okay to discontinue heparin at this time.  Given headaches, discontinue isosorbide mononitrate and increase carvedilol to 12.5 mg twice daily.  Continue high intensity statin therapy.  Hypertension: Blood pressure modestly elevated.  Increase carvedilol to 12.5 mg twice daily.  Discontinue isosorbide mononitrate.  Consider reinitiation of ARB at discretion of hospitalist.  Hyperlipidemia:  Continue atorvastatin 80 mg daily.  Polysubstance abuse: Patient reports history of tobacco use and was also found to be positive for marijuana on urine drug screen.  Recommend tobacco and marijuana cessation and avoidance of other recreational drugs.  [facility] will sign off.   Medication Recommendations: See above. Other recommendations (labs, testing, etc): None. Follow up as an outpatient: 1 to 2 weeks with me or APP.  Patient should remain out of work pending follow-up.  For questions or updates, please contact [facility] Please consult [email or web address] for contact info under [facility].     Signed, Yvonne Kendallhristopher Nathalee Smarr, MD  08/15/2019, 1:00 PM

## 2019-08-15 NOTE — Progress Notes (Signed)
IVs and tele removed from patient. Discharge instructions given to patient. Verbalized understanding. No acute distress at this time. Wife to transport patient home.  

## 2019-08-15 NOTE — Progress Notes (Signed)
ANTICOAGULATION CONSULT NOTE  Pharmacy Consult for heparin Indication: chest pain/ACS  Patient Measurements: Height: 5\' 7"  (170.2 cm) Weight: 270 lb (122.5 kg) IBW/kg (Calculated) : 66.1 Heparin Dosing Weight: 94.9 kg  Vital Signs: Temp: 98 F (36.7 C) (10/27 0320) Temp Source: Oral (10/27 0320) BP: 125/97 (10/27 0320) Pulse Rate: 66 (10/27 0320)  Labs: Recent Labs    08/13/19 0115 08/13/19 0543 08/13/19 1843 08/13/19 2032 08/13/19 2101  08/14/19 0323 08/14/19 0531 08/14/19 1045 08/14/19 2105 08/15/19 0312  HGB  --  13.9  --   --   --   --  13.9  --   --   --  13.6  HCT  --  41.8  --   --   --   --  41.9  --   --   --  41.6  PLT  --  185  --   --   --   --  180  --   --   --  159  APTT 24  --   --   --  25  --   --   --   --   --   --   LABPROT 13.5  --   --   --  13.3  --   --   --   --   --   --   INR 1.0  --   --   --  1.0  --   --   --   --   --   --   HEPARINUNFRC  --   --   --   --   --    < > 0.10*  --  0.24* 0.40 0.34  CREATININE  --  1.17  --   --   --   --  1.16  --   --   --  1.19  TROPONINIHS  --   --  14,917* 15,415*  --   --   --  10,791*  --   --   --    < > = values in this interval not displayed.    Estimated Creatinine Clearance: 108.7 mL/min (by C-G formula based on SCr of 1.19 mg/dL).   Medical History: Past Medical History:  Diagnosis Date  . Anxiety    NO MEDS  . Asthma    WELL CONTROLLED  . GERD (gastroesophageal reflux disease)    OCC TUMS  . Hypertension   . Sleep apnea    WILL OCC USE NOT EVERY DAY    Medications:  Scheduled:  . aspirin  81 mg Oral Pre-Cath  . aspirin EC  81 mg Oral Daily  . atorvastatin  80 mg Oral q1800  . carvedilol  6.25 mg Oral BID WC  . Chlorhexidine Gluconate Cloth  6 each Topical Daily  . clopidogrel  75 mg Oral Q breakfast  . isosorbide mononitrate  30 mg Oral Daily  . nicotine  14 mg Transdermal Daily  . sodium chloride flush  3 mL Intravenous Q12H    Assessment: Patient admitted tonight x  CP w/o any cardiac h/o, patient has a h/o of OSA on CPAP, tobacco use, asthma and obesity, had sudden onset CP w/ N/V w/ symptoms of chest tightness and lightheadedness w/ pain radiating to L arm. Initial trop was 6 - 8 but rose to 290. Currently, it is 14917. EKG initially showing ST depression, but now ST elevation in anterior leads. Cardiology plans to continue heparin drip for total of  48 hours for medical management  Goal of Therapy:  Heparin level 0.3-0.7 units/ml Monitor platelets by anticoagulation protocol: Yes   Plan:   10/26 @ 21:05  HL 0.40.  Continue current rate of 1500 units/hr.   10/27 @ 0312 HL 0.34, therapeutic x 2, continue current rate of 1500 units/hr, CBC OK  Recheck heparin level in am.  H&H, PLT stable: CBC in am  Wayland Denis, [facility] Clinical Pharmacist 08/15/2019,4:25 AM

## 2019-08-15 NOTE — Discharge Summary (Signed)
[facility] - [place] at [facility]  Marcus Bauer, 38 y.o., DOB [birth year], MRN [id number]. Admission date: 08/12/2019 Discharge Date 08/15/2019 Primary MD Marisue Ivan, MD Admitting Physician Jimmye Norman, NP  Admission Diagnosis  Pre-syncope [R55] Near syncope [R55] Nonspecific chest pain [R07.9] Chest pain with high risk of acute coronary syndrome [R07.9]  Discharge Diagnosis   Active Problems: Non-ST MI Essential hypertension Hyperlipidemia Substance abuse Generalized anxiety Headaches related to nitrate   Hospital Course Patient is 38 year old African-American male who presented with sudden onset of chest pain.  Initially his EKG cardiac enzymes were nonrevealing.  However he continued to have worsening chest pain he had to be placed on a nitroglycerin drip.  He was seen by cardiology and underwent a cardiac cath.  His cath did reveal coronary artery disease that was distal disease that was recommended to be medically managed.  Post cath started complaining of headaches related to nitroglycerin.  His nitroglycerin have been discontinued.  Patient has been seen by cardiology and has been okayed to be discharged.  Patient's blood pressure is noted to be elevated.  He also complains of feeling very anxious and overwhelmed at home.  I have recommended him to follow-up with psychiatry as outpatient.            Consults  cardiology  Significant Tests:  See full reports for all details     Ct Head Wo Contrast  Result Date: 08/14/2019 CLINICAL DATA:  Dizziness and headache EXAM: CT HEAD WITHOUT CONTRAST TECHNIQUE: Contiguous axial images were obtained from the base of the skull through the vertex without intravenous contrast. COMPARISON:  08/12/2019 head CT FINDINGS: Brain: There is no mass, hemorrhage or extra-axial collection. The size and configuration of the ventricles and extra-axial CSF spaces are normal. The brain parenchyma is normal,  without acute or chronic infarction. Vascular: No abnormal hyperdensity of the major intracranial arteries or dural venous sinuses. No intracranial atherosclerosis. Skull: The visualized skull base, calvarium and extracranial soft tissues are normal. Sinuses/Orbits: No fluid levels or advanced mucosal thickening of the visualized paranasal sinuses. No mastoid or middle ear effusion. The orbits are normal. IMPRESSION: Normal head CT. Electronically Signed   By: Deatra Robinson M.D.   On: 08/14/2019 19:38   Ct Head Wo Contrast  Result Date: 08/12/2019 CLINICAL DATA:  CAD evaluation, syncope, low CHD risk, asymptomatic for ischemia. Additional history provided: Near syncope while working outside. Feels weak. EXAM: CT HEAD WITHOUT CONTRAST TECHNIQUE: Contiguous axial images were obtained from the base of the skull through the vertex without intravenous contrast. COMPARISON:  Head CT 07/19/2009 FINDINGS: Brain: No evidence of acute intracranial hemorrhage. No demarcated cortical infarction. No evidence of intracranial mass. No midline shift or extra-axial fluid collection. Cerebral volume is normal for age. Vascular: No hyperdense vessel. Skull: Normal. Negative for fracture or focal lesion. Sinuses/Orbits: Visualized orbits demonstrate no acute abnormality. Minimal scattered paranasal sinus mucosal thickening at the imaged levels. No significant mastoid effusion. IMPRESSION: No CT evidence of acute intracranial abnormality. Electronically Signed   By: Jackey Loge DO   On: 08/12/2019 20:49   Ct Angio Chest Pe W Or Wo Contrast  Result Date: 08/12/2019 CLINICAL DATA:  38 year old male with chest pain. EXAM: CT ANGIOGRAPHY CHEST WITH CONTRAST TECHNIQUE: Multidetector CT imaging of the chest was performed using the standard protocol during bolus administration of intravenous contrast. Multiplanar CT image reconstructions and MIPs were obtained to evaluate the vascular anatomy. CONTRAST:  76mL OMNIPAQUE IOHEXOL 350  MG/ML SOLN  COMPARISON:  Chest CT dated 07/19/2009 and radiograph dated 08/12/2019 FINDINGS: Cardiovascular: Borderline cardiomegaly. No pericardial effusion. The thoracic aorta is grossly unremarkable for the degree of opacification. Evaluation of the pulmonary arteries is somewhat limited due to suboptimal opacification and respiratory motion artifact. No large or central pulmonary artery embolus identified. Mediastinum/Nodes: No hilar or mediastinal adenopathy. The esophagus is grossly unremarkable. No mediastinal fluid collection. Lungs/Pleura: The lungs are clear. There is no pleural effusion or pneumothorax. The central airways are patent. Upper Abdomen: No acute abnormality. Musculoskeletal: No chest wall abnormality. No acute or significant osseous findings. Review of the MIP images confirms the above findings. IMPRESSION: No acute intrathoracic pathology. No CT evidence of central pulmonary artery embolus. Electronically Signed   By: Anner Crete M.D.   On: 08/12/2019 22:29   US Carotid Bilateral  Result Date: 08/13/2019 CLINICAL DATA:  38 year old male with presyncope EXAM: BILATERAL CAROTID DUPLEX ULTRASOUND TECHNIQUE: Pearline Cables scale imaging, color Doppler and duplex ultrasound were performed of bilateral carotid and vertebral arteries in the neck. COMPARISON:  None. FINDINGS: Criteria: Quantification of carotid stenosis is based on velocity parameters that correlate the residual internal carotid diameter with NASCET-based stenosis levels, using the diameter of the distal internal carotid lumen as the denominator for stenosis measurement. The following velocity measurements were obtained: RIGHT ICA: 80/22 cm/sec CCA: 57/84 cm/sec SYSTOLIC ICA/CCA RATIO:  1.1 ECA:  112 cm/sec LEFT ICA: 51/14 cm/sec CCA: 696/29 cm/sec SYSTOLIC ICA/CCA RATIO:  0.5 ECA:  107 cm/sec RIGHT CAROTID ARTERY: No significant atherosclerotic plaque or evidence of stenosis. RIGHT VERTEBRAL ARTERY:  Patent with normal antegrade  flow. LEFT CAROTID ARTERY: No significant atherosclerotic plaque or evidence of stenosis. LEFT VERTEBRAL ARTERY:  Patent with normal antegrade flow. IMPRESSION: No evidence of atherosclerotic plaque or stenosis in either internal carotid artery. The vertebral arteries are patent with normal antegrade flow. Signed, Criselda Peaches, MD, [facility] Radiology Electronically Signed   By: Jacqulynn Cadet M.D.   On: 08/13/2019 11:09   Dg Chest Port 1 View  Result Date: 08/12/2019 CLINICAL DATA:  Chest pain and shortness of breath EXAM: PORTABLE CHEST 1 VIEW COMPARISON:  July 20, 2009 FINDINGS: Lungs are clear. Heart size and pulmonary vascularity are normal. No adenopathy. No pneumothorax. No bone lesions. IMPRESSION: No edema or consolidation. Electronically Signed   By: Lowella Grip III M.D.   On: 08/12/2019 17:04       Today   Subjective:   Philipp Deputy patient's chest pain is now resolved  Objective:   Blood pressure (!) 140/97, pulse 71, temperature 97.7 F (36.5 C), resp. rate 18, height 5\' 7"  (1.702 m), weight 122.5 kg, SpO2 99 %.  .  Intake/Output Summary (Last 24 hours) at 08/15/2019 1317 Last data filed at 08/15/2019 1057 Gross per 24 hour  Intake 240 ml  Output 450 ml  Net -210 ml    Exam VITAL SIGNS: Blood pressure (!) 140/97, pulse 71, temperature 97.7 F (36.5 C), resp. rate 18, height 5\' 7"  (1.702 m), weight 122.5 kg, SpO2 99 %.  GENERAL:  38 y.o.-year-old patient lying in the bed with no acute distress.  EYES: Pupils equal, round, reactive to light and accommodation. No scleral icterus. Extraocular muscles intact.  HEENT: Head atraumatic, normocephalic. Oropharynx and nasopharynx clear.  NECK:  Supple, no jugular venous distention. No thyroid enlargement, no tenderness.  LUNGS: Normal breath sounds bilaterally, no wheezing, rales,rhonchi or crepitation. No use of accessory muscles of respiration.   CARDIOVASCULAR: S1,  S2 normal. No murmurs, rubs, or gallops.  ABDOMEN: Soft, nontender, nondistended. Bowel sounds present. No organomegaly or mass.  EXTREMITIES: No pedal edema, cyanosis, or clubbing.  NEUROLOGIC: Cranial nerves II through XII are intact. Muscle strength 5/5 in all extremities. Sensation intact. Gait not checked.  PSYCHIATRIC: The patient is alert and oriented x 3.  SKIN: No obvious rash, lesion, or ulcer.   Data Review     CBC w Diff:  Lab Results  Component Value Date   WBC 9.0 08/15/2019   HGB 13.6 08/15/2019   HCT 41.6 08/15/2019   PLT 159 08/15/2019   CMP:  Lab Results  Component Value Date   NA 139 08/15/2019   K 4.1 08/15/2019   CL 106 08/15/2019   CO2 26 08/15/2019   BUN 12 08/15/2019   CREATININE 1.19 08/15/2019  .  Micro Results Recent Results (from the past 240 hour(s))  SARS CORONAVIRUS 2 (TAT 6-24 HRS) Nasopharyngeal Nasopharyngeal Swab     Status: None   Collection Time: 08/12/19  6:38 PM   Specimen: Nasopharyngeal Swab  Result Value Ref Range Status   SARS Coronavirus 2 NEGATIVE NEGATIVE Final    Comment: (NOTE) SARS-CoV-2 target nucleic acids are NOT DETECTED. The SARS-CoV-2 RNA is generally detectable in upper and lower respiratory specimens during the acute phase of infection. Negative results do not preclude SARS-CoV-2 infection, do not rule out co-infections with other pathogens, and should not be used as the sole basis for treatment or other patient management decisions. Negative results must be combined with clinical observations, patient history, and epidemiological information. The expected result is Negative. Fact Sheet for Patients: HairSlick.no Fact Sheet for Healthcare Providers: quierodirigir.com This test is not yet approved or cleared by the [place] FDA and  has been authorized for detection and/or diagnosis of SARS-CoV-2 by FDA under an Emergency Use  Authorization (EUA). This EUA will remain  in effect (meaning this test can be used) for the duration of the COVID-19 declaration under Section 56 4(b)(1) of the Act, 21 U.S.C. section 360bbb-3(b)(1), unless the authorization is terminated or revoked sooner. Performed at [facility], [address], [place], [place] [address]   SARS Coronavirus 2 by RT PCR (hospital order, performed in [facility] hospital lab) Nasopharyngeal Nasopharyngeal Swab     Status: None   Collection Time: 08/13/19  2:00 AM   Specimen: Nasopharyngeal Swab  Result Value Ref Range Status   SARS Coronavirus 2 NEGATIVE NEGATIVE Final    Comment: (NOTE) If result is NEGATIVE SARS-CoV-2 target nucleic acids are NOT DETECTED. The SARS-CoV-2 RNA is generally detectable in upper and lower  respiratory specimens during the acute phase of infection. The lowest  concentration of SARS-CoV-2 viral copies this assay can detect is 250  copies / mL. A negative result does not preclude SARS-CoV-2 infection  and should not be used as the sole basis for treatment or other  patient management decisions.  A negative result may occur with  improper specimen collection / handling, submission of specimen other  than nasopharyngeal swab, presence of viral mutation(s) within the  areas targeted by this assay, and inadequate number of viral copies  (<250 copies / mL). A negative result must be combined with clinical  observations, patient history, and epidemiological information. If result is POSITIVE SARS-CoV-2 target nucleic acids are DETECTED. The SARS-CoV-2 RNA is generally detectable in upper and lower  respiratory specimens dur ing the acute phase of infection.  Positive  results are indicative of  active infection with SARS-CoV-2.  Clinical  correlation with patient history and other diagnostic information is  necessary to determine patient infection status.  Positive results do  not rule out bacterial infection or  co-infection with other viruses. If result is PRESUMPTIVE POSTIVE SARS-CoV-2 nucleic acids MAY BE PRESENT.   A presumptive positive result was obtained on the submitted specimen  and confirmed on repeat testing.  While 2019 novel coronavirus  (SARS-CoV-2) nucleic acids may be present in the submitted sample  additional confirmatory testing may be necessary for epidemiological  and / or clinical management purposes  to differentiate between  SARS-CoV-2 and other Sarbecovirus currently known to infect humans.  If clinically indicated additional testing with an alternate test  methodology [phone] is advised. The SARS-CoV-2 RNA is generally  detectable in upper and lower respiratory sp ecimens during the acute  phase of infection. The expected result is Negative. Fact Sheet for Patients:  BoilerBrush.com.cyhttps://www.fda.gov/media/136312/download Fact Sheet for Healthcare Providers: https://pope.com/https://www.fda.gov/media/136313/download This test is not yet approved or cleared by the [place] FDA and has been authorized for detection and/or diagnosis of SARS-CoV-2 by FDA under an Emergency Use Authorization (EUA).  This EUA will remain in effect (meaning this test can be used) for the duration of the COVID-19 declaration under Section 564(b)(1) of the Act, 21 U.S.C. section 360bbb-3(b)(1), unless the authorization is terminated or revoked sooner. Performed at [facility], [address] [place], [place] [address]         Code Status Orders  (From admission, onward)         Start     Ordered   08/12/19 1849  Full code  Continuous     08/12/19 1852        Code Status History    Date Active Date Inactive Code Status Order ID Comments User Context   12/20/2018 1919 12/20/2018 2253 Full Code [id number]  Poggi, Excell SeltzerJohn J, MD Inpatient   Advance Care Planning Activity    Advance Directive Documentation     Most Recent Value  Type of Advance Directive  Living will  Pre-existing out of facility  DNR order (yellow form or pink MOST form)  -  "MOST" Form in Place?  -          Follow-up Information    [facility] Cardiac [facility] Pulmonary Rehab Follow up on 08/16/2019.   Specialty: Cardiac Rehabilitation Why: @ 10 a.m.   Your Cardiologist has referred you to outpatient Cardiac Rehab at [facility]. Your first appointment is with the Registered Nurse for the Telephone Assessment.  Expect a call at 10 a.m.  Contact information: [address] [id number] [place] [id number]       Yvonne KendallEnd, Christopher, MD. Schedule an appointment as soon as possible for a visit in 10 days.   Specialty: Cardiology Contact information: [address] [place] [address] [phone]        Darliss RidgelKapur, Aarti K, MD. Schedule an appointment as soon as possible for a visit in 2 weeks.   Specialty: Psychiatry Contact information: [address] [place] [address] [phone]        Marisue IvanLinthavong, Kanhka, MD On 08/23/2019.   Specialty: Family Medicine Why: APPOINTMENT AT 10:45AM Contact information: [address] [facility] [place] [address] [phone]           Discharge Medications   Allergies as of 08/15/2019      Reactions   Shellfish Allergy Shortness Of Breath  Hydrochlorothiazide Other (See Comments)   Intolerance - malaise   Other Hives   CLOROX      Medication List    TAKE these medications   albuterol 108 (90 Base) MCG/ACT inhaler Commonly known as: VENTOLIN HFA Inhale 2 puffs into the lungs every 6 (six) hours as needed for wheezing or shortness of breath.   ALPRAZolam 0.25 MG tablet Commonly known as: Xanax Take 1 tablet (0.25 mg total) by mouth 3 (three) times daily as needed for anxiety.   aspirin 81 MG EC tablet Take 1 tablet (81 mg total) by mouth daily. Start taking on: August 16, 2019   atorvastatin 80 MG tablet Commonly known as: LIPITOR Take 1 tablet (80 mg total) by mouth daily at  6 PM.   calcium carbonate 500 MG chewable tablet Commonly known as: TUMS - dosed in mg elemental calcium Chew 1 tablet by mouth as needed for indigestion or heartburn.   carvedilol 25 MG tablet Commonly known as: Coreg Take 1 tablet (25 mg total) by mouth 2 (two) times daily.   clopidogrel 75 MG tablet Commonly known as: PLAVIX Take 1 tablet (75 mg total) by mouth daily with breakfast. Start taking on: August 16, 2019   losartan 50 MG tablet Commonly known as: COZAAR Take 2 tablets (100 mg total) by mouth daily. What changed: how much to take   nitroGLYCERIN 0.4 MG SL tablet Commonly known as: NITROSTAT Place 1 tablet (0.4 mg total) under the tongue every 5 (five) minutes x 3 doses as needed for chest pain.   oxyCODONE-acetaminophen 5-325 MG tablet Commonly known as: PERCOCET/ROXICET Take 1 tablet by mouth every 6 (six) hours as needed for moderate pain.          Total Time in preparing paper work, data evaluation and todays exam - 35 minutes  Auburn Bilberry M.D on 08/15/2019 at 1:17 PM [facility]   Office  [phone]

## 2019-08-15 NOTE — Progress Notes (Addendum)
Marcus Bauer at [facility] was admitted to the Hospital on 08/12/2019 and Discharged  08/15/2019 and should be excused from work/school   for  16 days starting 08/12/2019 , may return to work/school without any restrictions.  Call Dustin Flock MD with questions.  Dustin Flock M.D on 08/15/2019,at 11:16 AM  [place] at [facility]  [phone]

## 2019-08-15 NOTE — Progress Notes (Signed)
Cardiovascular and Pulmonary Nurse Navigator Note:    Rounded on patient to follow-up on education provided yesterday - 08/14/2019.  Patient sitting up in bed watching TV and talking to wife.  Patient gave this RN verbal permission to speak in front of wife about his health.    Reviewed risk factor modification, medication, Cardiac Rehab. Patient informed this RN MD is going to stop IMDUR as patient has been having headaches.  Patient underwent head CT for c/o of headaches yesterday. Normal Head CT.  Patient is ready to start Cardiac Rehab.  Patient is scheduled for Telephone RN assessment at 10 a.m. on 08/16/2019.  Confirmed best phone number to contact patient for RN Telephone Assessment - [phone].    Smoking Cessation - "Thinking about Quitting - Yes You Can" handout given and reviewed with patient and wife.  Patient smokes one pack per day.   Patient is reporting having had cravings here in the hospital.  Nicotine patch 14 mg ordered / administered while patient hospitalized.  Patient acknowledged that he needs to quit and wants to quit, but does not think he will be able to quit cold [place].   Discussed tapering down, tracking number of cigarettes per day and brand switching in preparing to quit.  Discussed preparing to quit and what one will do when one has cravings.    Patient and wife appreciative of the above information.    Roanna Epley, RN, BSN, [place] [facility]  Cardiovascular & Pulmonary Nurse Navigator  Direct Line: [phone]  Department Phone #: [phone] Fax: [phone]  Email Address: [email or web address]

## 2019-08-16 ENCOUNTER — Other Ambulatory Visit: Payer: Self-pay

## 2019-08-16 ENCOUNTER — Encounter: Payer: No Typology Code available for payment source | Admitting: *Deleted

## 2019-08-16 DIAGNOSIS — I214 Non-ST elevation (NSTEMI) myocardial infarction: Secondary | ICD-10-CM

## 2019-08-16 NOTE — Progress Notes (Signed)
Completed virtual orientation.  Documentation for diagnosis can be found in [facility] encounter 08/12/19.  EP eval scheduled for 11am tomorrow.

## 2019-08-17 ENCOUNTER — Encounter: Payer: Self-pay | Admitting: *Deleted

## 2019-08-17 ENCOUNTER — Other Ambulatory Visit: Payer: Self-pay

## 2019-08-17 ENCOUNTER — Encounter: Payer: No Typology Code available for payment source | Attending: Internal Medicine | Admitting: *Deleted

## 2019-08-17 VITALS — Ht 66.1 in | Wt 271.3 lb

## 2019-08-17 DIAGNOSIS — Z79899 Other long term (current) drug therapy: Secondary | ICD-10-CM | POA: Diagnosis not present

## 2019-08-17 DIAGNOSIS — Z7982 Long term (current) use of aspirin: Secondary | ICD-10-CM | POA: Insufficient documentation

## 2019-08-17 DIAGNOSIS — F419 Anxiety disorder, unspecified: Secondary | ICD-10-CM | POA: Insufficient documentation

## 2019-08-17 DIAGNOSIS — Z7902 Long term (current) use of antithrombotics/antiplatelets: Secondary | ICD-10-CM | POA: Diagnosis not present

## 2019-08-17 DIAGNOSIS — I10 Essential (primary) hypertension: Secondary | ICD-10-CM | POA: Diagnosis not present

## 2019-08-17 DIAGNOSIS — F17210 Nicotine dependence, cigarettes, uncomplicated: Secondary | ICD-10-CM | POA: Insufficient documentation

## 2019-08-17 DIAGNOSIS — K219 Gastro-esophageal reflux disease without esophagitis: Secondary | ICD-10-CM | POA: Insufficient documentation

## 2019-08-17 DIAGNOSIS — I214 Non-ST elevation (NSTEMI) myocardial infarction: Secondary | ICD-10-CM | POA: Diagnosis not present

## 2019-08-17 DIAGNOSIS — Z7901 Long term (current) use of anticoagulants: Secondary | ICD-10-CM | POA: Insufficient documentation

## 2019-08-17 NOTE — Progress Notes (Signed)
Cardiac Individual Treatment Plan  Patient Details  Name: Marcus Bauer MRN: [id number] Date of Birth: December 12, 1980 Referring Provider:     Cardiac Rehab from 08/17/2019 in [facility] Cardiac and Pulmonary Rehab  Referring Provider  End, Harrell Gave MD      Initial Encounter Date:    Cardiac Rehab from 08/17/2019 in [facility] Cardiac and Pulmonary Rehab  Date  08/17/19      Visit Diagnosis: NSTEMI (non-ST elevated myocardial infarction) [facility])  Patient's Home Medications on Admission:  Current Outpatient Medications:  .  albuterol (PROVENTIL HFA;VENTOLIN HFA) 108 (90 Base) MCG/ACT inhaler, Inhale 2 puffs into the lungs every 6 (six) hours as needed for wheezing or shortness of breath., Disp: , Rfl:  .  ALPRAZolam (XANAX) 0.25 MG tablet, Take 1 tablet (0.25 mg total) by mouth 3 (three) times daily as needed for anxiety., Disp: 30 tablet, Rfl: 0 .  aspirin EC 81 MG EC tablet, Take 1 tablet (81 mg total) by mouth daily., Disp: 30 tablet, Rfl: 0 .  atorvastatin (LIPITOR) 80 MG tablet, Take 1 tablet (80 mg total) by mouth daily at 6 PM., Disp: 60 tablet, Rfl: 0 .  calcium carbonate (TUMS - DOSED IN MG ELEMENTAL CALCIUM) 500 MG chewable tablet, Chew 1 tablet by mouth as needed for indigestion or heartburn., Disp: , Rfl:  .  carvedilol (COREG) 25 MG tablet, Take 1 tablet (25 mg total) by mouth 2 (two) times daily., Disp: 60 tablet, Rfl: 11 .  clopidogrel (PLAVIX) 75 MG tablet, Take 1 tablet (75 mg total) by mouth daily with breakfast., Disp: 30 tablet, Rfl: 0 .  losartan (COZAAR) 50 MG tablet, Take 2 tablets (100 mg total) by mouth daily., Disp: 60 tablet, Rfl: 11 .  nitroGLYCERIN (NITROSTAT) 0.4 MG SL tablet, Place 1 tablet (0.4 mg total) under the tongue every 5 (five) minutes x 3 doses as needed for chest pain., Disp: 30 tablet, Rfl: 12 .  oxyCODONE-acetaminophen (PERCOCET/ROXICET) 5-325 MG tablet, Take 1 tablet by mouth every 6 (six) hours as needed for moderate pain., Disp: 25 tablet, Rfl: 0  Past  Medical History: Past Medical History:  Diagnosis Date  . Anxiety    NO MEDS  . Asthma    WELL CONTROLLED  . GERD (gastroesophageal reflux disease)    OCC TUMS  . Hypertension   . Sleep apnea    WILL OCC USE NOT EVERY DAY    Tobacco Use: Social History   Tobacco Use  Smoking Status Current Every Day Smoker  . Packs/day: 0.50  . Years: 18.00  . Pack years: 9.00  . Types: Cigarettes  Smokeless Tobacco Current User  . Types: Chew  Tobacco Comment   10/29 down to 1/2 ppd, given fake cigarette and quit smart handout    Labs: Recent Review Flowsheet Data    Labs for ITP Cardiac and Pulmonary Rehab Latest Ref Rng & Units 08/12/2019 08/13/2019   Cholestrol 0 - 200 mg/dL - 139   LDLCALC 0 - 99 mg/dL - 73   HDL >40 mg/dL - 42   Trlycerides <150 mg/dL - 118   Hemoglobin A1c 4.8 - 5.6 % 5.6 -       Exercise Target Goals: Exercise Program Goal: Individual exercise prescription set using results from initial 6 min walk test and THRR while considering  patient's activity barriers and safety.   Exercise Prescription Goal: Initial exercise prescription builds to 30-45 minutes a day of aerobic activity, 2-3 days per week.  Home exercise guidelines will be given  to patient during program as part of exercise prescription that the participant will acknowledge.  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 HR  106 bpm     Max Ex. BP  144/74     2 Minute Post BP  136/72        Oxygen Initial Assessment:   Oxygen Re-Evaluation:   Oxygen Discharge (Final Oxygen Re-Evaluation):   Initial Exercise Prescription: Initial Exercise Prescription - 08/17/19 1300      Date of Initial Exercise RX and Referring Provider   Date  08/17/19    Referring Provider  End, Harrell Gave MD      Treadmill   MPH  2    Grade  1    Minutes  15    METs  2.81      Recumbant Bike   Level  3    RPM  50    Watts  63    Minutes  15    METs  3      NuStep   Level  4    SPM  80    Minutes  15    METs  3      Elliptical   Level  1    Speed  2.5    Minutes  15      REL-XR   Level  3    Speed  50    Minutes  15    METs  3      T5 Nustep   Level  4    SPM  80    Minutes  15    METs  3      Prescription Details   Frequency (times per week)  3    Duration  Progress to 30 minutes of continuous aerobic without signs/symptoms of physical distress      Intensity   THRR 40-80% of Max Heartrate  117-162    Ratings of Perceived Exertion  11-13    Perceived Dyspnea  0-4      Progression   Progression  Continue to progress workloads to maintain intensity without signs/symptoms of physical distress.      Resistance Training   Training Prescription  Yes    Weight  5 lbs    Reps  10-15       Perform Capillary Blood Glucose checks as needed.  Exercise Prescription Changes: Exercise Prescription Changes  Christian Name 08/17/19 1300             Response to Exercise   Blood Pressure (Admit)  134/68       Blood Pressure (Exercise)  144/74       Blood Pressure (Exit)  136/72       Heart Rate (Admit)  71 bpm       Heart Rate (Exercise)  106 bpm       Heart Rate (Exit)  73 bpm       Oxygen Saturation (Admit)  97 %       Oxygen Saturation (Exercise)  96 %       Rating of Perceived Exertion (Exercise)  7        Symptoms  none       Comments  walk test results          Exercise Comments:   Exercise Goals and Review: Exercise Goals    Row Name 08/17/19 1347             Exercise Goals   Increase Physical Activity  Yes       Intervention  Provide advice, education, support and counseling about physical activity/exercise needs.;Develop an individualized exercise prescription for aerobic and resistive training based on initial evaluation findings, risk stratification, comorbidities and participant's personal goals.       Expected Outcomes  Short Term: Attend rehab on a regular basis to increase amount of physical activity.;Long Term: Add in home exercise to make exercise part of routine and to increase amount of physical activity.;Long Term: Exercising regularly at least 3-5 days a week.       Increase Strength and Stamina  Yes       Intervention  Provide advice, education, support and counseling about physical activity/exercise needs.;Develop an individualized exercise prescription for aerobic and resistive training based on initial evaluation findings, risk stratification, comorbidities and participant's personal goals.       Expected Outcomes  Short Term: Increase workloads from initial exercise prescription for resistance, speed, and METs.;Short Term: Perform resistance training exercises routinely during rehab and add in resistance training at home;Long Term: Improve cardiorespiratory fitness, muscular endurance and strength as measured by increased METs and functional capacity (6MWT)       Able to understand and use rate of perceived exertion (RPE) scale  Yes       Intervention  Provide education and explanation on how to use RPE scale       Expected Outcomes  Short Term: Able to use RPE daily in rehab to express subjective intensity level;Long Term:  Able to use RPE to guide intensity level when exercising independently       Knowledge and understanding of Target Heart Rate Range (THRR)   Yes       Intervention  Provide education and explanation of THRR including how the numbers were predicted and where they are located for reference       Expected Outcomes  Short Term: Able to state/look up THRR;Short Term: Able to use daily as guideline for intensity in rehab;Long Term: Able to use THRR to govern intensity when exercising independently       Able to check pulse independently  Yes       Intervention  Provide education and demonstration on how to check pulse in carotid and radial arteries.;Review the importance of being able to check your own pulse for safety during independent exercise       Expected Outcomes  Short Term:  Able to explain why pulse checking is important during independent exercise;Long Term: Able to check pulse independently and accurately       Understanding of Exercise Prescription  Yes       Intervention  Provide education, explanation, and written materials on patient's individual exercise prescription       Expected Outcomes  Long Term: Able to explain home exercise prescription to exercise independently;Short Term: Able to explain program exercise prescription          Exercise Goals Re-Evaluation :   Discharge Exercise Prescription (Final Exercise Prescription Changes): Exercise Prescription Changes - 08/17/19 1300      Response to Exercise   Blood Pressure (Admit)  134/68    Blood Pressure (Exercise)  144/74    Blood Pressure (Exit)  136/72    Heart Rate (Admit)  71 bpm    Heart Rate (Exercise)  106 bpm    Heart Rate (Exit)  73 bpm    Oxygen Saturation (Admit)  97 %    Oxygen Saturation (Exercise)  96 %    Rating of Perceived Exertion (Exercise)  7    Symptoms  none    Comments  walk test results       Nutrition:  Target Goals: Understanding of nutrition guidelines, daily intake of sodium <1555m, cholesterol <2059m calories 30% from fat and 7% or less from saturated fats, daily to have 5 or more servings of fruits and  vegetables.  Biometrics: Pre Biometrics - 08/17/19 1347      Pre Biometrics   Height  5' 6.1" (1.679 m)    Weight  271 lb 4.8 oz (123.1 kg)    BMI (Calculated)  43.65    Single Leg Stand  30 seconds        Nutrition Therapy Plan and Nutrition Goals:   Nutrition Assessments: Nutrition Assessments - 08/17/19 1348      MEDFICTS Scores   Pre Score  62       Nutrition Goals Re-Evaluation:   Nutrition Goals Discharge (Final Nutrition Goals Re-Evaluation):   Psychosocial: Target Goals: Acknowledge presence or absence of significant depression and/or stress, maximize coping skills, provide positive support system. Participant is able to verbalize types and ability to use techniques and skills needed for reducing stress and depression.   Initial Review & Psychosocial Screening: Initial Psych Review & Screening - 08/16/19 1031      Initial Review   Current issues with  Current Stress Concerns;Current Anxiety/Panic;Current Psychotropic Meds;Current Depression    Source of Stress Concerns  Occupation;Unable to perform yard/household activities;Family    Comments  Currently out on FMLA for work.  Family Obligations to care for them and house, Current anxiety on xanax and some depression      Family Dynamics   Good Support System?  Yes   wife, family, friends     Barriers   Psychosocial barriers to participate in program  The patient should benefit from training in stress management and relaxation.;Psychosocial barriers identified (see note)      Screening Interventions   Interventions  Encouraged to exercise;To provide support and resources with identified psychosocial needs;Provide feedback about the scores to participant    Expected Outcomes  Short Term goal: Utilizing psychosocial counselor, staff and physician to assist with identification of specific Stressors or current issues interfering with healing process. Setting desired goal for each stressor or current issue  identified.;Long Term Goal: Stressors or current issues are controlled or eliminated.;Short Term goal: Identification and review with participant of  any Quality of Life or Depression concerns found by scoring the questionnaire.;Long Term goal: The participant improves quality of Life and PHQ9 Scores as seen by post scores and/or verbalization of changes       Quality of Life Scores:  Quality of Life - 08/17/19 1348      Quality of Life   Select  Quality of Life      Quality of Life Scores   Health/Function Pre  15.73 %    Socioeconomic Pre  16.07 %    Psych/Spiritual Pre  8.64 %    Family Pre  21.6 %    GLOBAL Pre  15.2 %      Scores of 19 and below usually indicate a poorer quality of life in these areas.  A difference of  2-3 points is a clinically meaningful difference.  A difference of 2-3 points in the total score of the Quality of Life Index has been associated with significant improvement in overall quality of life, self-image, physical symptoms, and general health in studies assessing change in quality of life.  PHQ-9: Recent Review Flowsheet Data    Depression screen [facility] 2/9 08/17/2019   Decreased Interest 0   Down, Depressed, Hopeless 3   PHQ - 2 Score 3   Altered sleeping 1   Tired, decreased energy 2   Change in appetite 0   Feeling bad or failure about yourself  1   Trouble concentrating 0   Moving slowly or fidgety/restless 2   Suicidal thoughts 0   PHQ-9 Score 9   Difficult doing work/chores Somewhat difficult     Interpretation of Total Score  Total Score Depression Severity:  1-4 = Minimal depression, 5-9 = Mild depression, 10-14 = Moderate depression, 15-19 = Moderately severe depression, 20-27 = Severe depression   Psychosocial Evaluation and Intervention: Psychosocial Evaluation - 08/16/19 1035      Psychosocial Evaluation & Interventions   Interventions  Encouraged to exercise with the program and follow exercise prescription;Stress management  education    Comments  He has already started to walk and get into routine.  He is already trying to work on improving his diet and taking his medicine.  He is trying to stay postivie and get into habit of taking his medicaitons.  He has a great support system and gets lots of family and friends to stop by and check in.    Expected Outcomes  Short: Try to use exercise to help cope with stress.  Long: Continue to stay positive.    Continue Psychosocial Services   Follow up required by staff       Psychosocial Re-Evaluation:   Psychosocial Discharge (Final Psychosocial Re-Evaluation):   Vocational Rehabilitation: Provide vocational rehab assistance to qualifying candidates.   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 Participant will state understanding/return demonstration of topics presented as noted by education test scores.  Learning Barriers/Preferences: Learning Barriers/Preferences - 08/16/19 1029      Learning Barriers/Preferences   Learning Barriers  Sight   glassess   Learning Preferences  None       Education Topics:  AED/CPR: - Group verbal  and written instruction with the use of models to demonstrate the basic use of the AED with the basic ABC's of resuscitation.   General Nutrition Guidelines/Fats and Fiber: -Group instruction provided by verbal, written material, models and posters to present the general guidelines for heart healthy nutrition. Gives an explanation and review of dietary fats and fiber.   Controlling Sodium/Reading Food Labels: -Group verbal and written material  supporting the discussion of sodium use in heart healthy nutrition. Review and explanation with models, verbal and written materials for utilization of the food label.   Exercise Physiology & General Exercise Guidelines: - Group verbal and written instruction with models to review the exercise physiology of the cardiovascular system and associated critical values. Provides general exercise guidelines with specific guidelines to those with heart or lung disease.    Aerobic Exercise & Resistance Training: - Gives group verbal and written instruction on the various components of exercise. Focuses on aerobic and resistive training programs and the benefits of this training and how to safely progress through these programs..   Flexibility, Balance, Mind/Body Relaxation: Provides group verbal/written instruction on the benefits of flexibility and balance training, including mind/body exercise modes such as yoga, pilates and tai chi.  Demonstration and skill practice provided.   Stress and Anxiety: - Provides group verbal and written instruction about the health risks of elevated stress and causes of high stress.  Discuss the correlation between heart/lung disease and anxiety and treatment options. Review healthy ways to manage with stress and anxiety.   Depression: - Provides group verbal and written instruction on the correlation between heart/lung disease and depressed mood, treatment options, and the stigmas associated with seeking treatment.   Anatomy & Physiology of the Heart: - Group verbal and written instruction and models provide basic cardiac anatomy and physiology, with the coronary electrical and arterial systems. Review of Valvular disease and Heart Failure   Cardiac Procedures: - Group verbal and written instruction to review commonly prescribed medications for heart disease. Reviews the medication, class of the drug, and side effects. Includes the steps to properly store meds and  maintain the prescription regimen. (beta blockers and nitrates)   Cardiac Medications I: - Group verbal and written instruction to review commonly prescribed medications for heart disease. Reviews the medication, class of the drug, and side effects. Includes the steps to properly store meds and maintain the prescription regimen.   Cardiac Medications II: -Group verbal and written instruction to review commonly prescribed medications for heart disease. Reviews the medication, class of the drug, and side effects. (all other drug classes)    Go Sex-Intimacy & Heart Disease, Get SMART - Goal Setting: - Group verbal and written instruction through game format to discuss heart disease and the return to sexual intimacy. Provides group verbal and written material to discuss and apply goal setting through the application of the S.M.A.R.T. Method.   Other Matters of the Heart: - Provides group verbal, written materials and models to describe Stable Angina and Peripheral Artery. Includes description of the disease process and treatment options available to the cardiac patient.   Exercise & Equipment Safety: - Individual verbal instruction and demonstration of equipment use and safety with use of the equipment.   Cardiac Rehab from 08/17/2019 in [facility] Cardiac and Pulmonary Rehab  Date  08/17/19  Educator  [facility]  Instruction Review Code  1- Verbalizes Understanding      Infection Prevention: - Provides verbal and written material to individual with discussion of infection control including  proper hand washing and proper equipment cleaning during exercise session.   Cardiac Rehab from 08/17/2019 in [facility] Cardiac and Pulmonary Rehab  Date  08/17/19  Educator  [facility]  Instruction Review Code  1- Verbalizes Understanding      Falls Prevention: - Provides verbal and written material to individual with discussion of falls prevention and safety.   Cardiac Rehab from 08/17/2019 in [facility] Cardiac and  Pulmonary Rehab  Date  08/17/19  Educator  [facility]  Instruction Review Code  1- Verbalizes Understanding      Diabetes: - Individual verbal and written instruction to review signs/symptoms of diabetes, desired ranges of glucose level fasting, after meals and with exercise. Acknowledge that pre and post exercise glucose checks will be done for 3 sessions at entry of program.   Know Your Numbers and Risk Factors: -Group verbal and written instruction about important numbers in your health.  Discussion of what are risk factors and how they play a role in the disease process.  Review of Cholesterol, Blood Pressure, Diabetes, and BMI and the role they play in your overall health.   Sleep Hygiene: -Provides group verbal and written instruction about how sleep can affect your health.  Define sleep hygiene, discuss sleep cycles and impact of sleep habits. Review good sleep hygiene tips.    Other: -Provides group and verbal instruction on various topics (see comments)   Knowledge Questionnaire Score: Knowledge Questionnaire Score - 08/17/19 1348      Knowledge Questionnaire Score   Pre Score  20/26 Education Focus: A&P, Angina, Nurtition, Exercise       Core Components/Risk Factors/Patient Goals at Admission: Personal Goals and Risk Factors at Admission - 08/17/19 1349      Core Components/Risk Factors/Patient Goals on Admission    Weight Management  Yes;Weight Loss;Obesity    Intervention  Weight Management: Provide education and appropriate resources to help participant work on and attain dietary goals.;Weight Management: Develop a combined nutrition and exercise program designed to reach desired caloric intake, while maintaining appropriate intake of nutrient and fiber, sodium and fats, and appropriate energy expenditure required for the weight goal.;Weight Management/Obesity: Establish reasonable short term and long term weight goals.;Obesity: Provide education and appropriate resources to  help participant work on and attain dietary goals.    Admit Weight  271 lb 4.8 oz (123.1 kg)    Goal Weight: Short Term  265 lb (120.2 kg)    Goal Weight: Long Term  260 lb (117.9 kg)    Expected Outcomes  Short Term: Continue to assess and modify interventions until short term weight is achieved;Long Term: Adherence to nutrition and physical activity/exercise program aimed toward attainment of established weight goal;Weight Loss: Understanding of general recommendations for a balanced deficit meal plan, which promotes 1-2 lb weight loss per week and includes a negative energy balance of [phone] kcal/d;Understanding recommendations for meals to include 15-35% energy as protein, 25-35% energy from fat, 35-60% energy from carbohydrates, less than 227m of dietary cholesterol, 20-35 gm of total fiber daily;Understanding of distribution of calorie intake throughout the day with the consumption of 4-5 meals/snacks    Tobacco Cessation  Yes    Number of packs per day  1/2    Intervention  Assist the participant in steps to quit. Provide individualized education and counseling about committing to Tobacco Cessation, relapse prevention, and pharmacological support that can be provided by physician.;OAdvice worker assist with locating and accessing local/national Quit Smoking programs, and support quit date choice.  Expected Outcomes  Short Term: Will demonstrate readiness to quit, by selecting a quit date.;Short Term: Will quit all tobacco product use, adhering to prevention of relapse plan.;Long Term: Complete abstinence from all tobacco products for at least 12 months from quit date.    Hypertension  Yes    Intervention  Provide education on lifestyle modifcations including regular physical activity/exercise, weight management, moderate sodium restriction and increased consumption of fresh fruit, vegetables, and low fat dairy, alcohol moderation, and smoking cessation.;Monitor prescription use  compliance.    Expected Outcomes  Short Term: Continued assessment and intervention until BP is < 140/81m HG in hypertensive participants. < 130/852mHG in hypertensive participants with diabetes, heart failure or chronic kidney disease.;Long Term: Maintenance of blood pressure at goal levels.    Lipids  Yes    Intervention  Provide education and support for participant on nutrition & aerobic/resistive exercise along with prescribed medications to achieve LDL <7074mHDL >55m54m  Expected Outcomes  Short Term: Participant states understanding of desired cholesterol values and is compliant with medications prescribed. Participant is following exercise prescription and nutrition guidelines.;Long Term: Cholesterol controlled with medications as prescribed, with individualized exercise RX and with personalized nutrition plan. Value goals: LDL < 70mg77mL > 40 mg.       Core Components/Risk Factors/Patient Goals Review:    Core Components/Risk Factors/Patient Goals at Discharge (Final Review):    ITP Comments: ITP Comments    Row Name 08/16/19 1057 08/17/19 1342         ITP Comments  Completed virtual orientation.  [facility] for [facility].  EP eval scheduled for 11am tomorrow.  Completed 6MWT and gym orientation.  Initial ITP created and sent for review to Dr. Mark Emily Filbertical Director.         Comments: Initial ITP

## 2019-08-17 NOTE — Patient Instructions (Signed)
Patient Instructions  Patient Details  Name: Marcus Bauer MRN: [id number] Date of Birth: 1981/04/04 Referring Provider:  Yvonne KendallEnd, Christopher, MD  Below are your personal goals for exercise, nutrition, and risk factors. Our goal is to help you stay on track towards obtaining and maintaining these goals. We will be discussing your progress on these goals with you throughout the program.  Initial Exercise Prescription: Initial Exercise Prescription - 08/17/19 1300      Date of Initial Exercise RX and Referring Provider   Date  08/17/19    Referring Provider  End, Cristal Deerhristopher MD      Treadmill   MPH  2    Grade  1    Minutes  15    METs  2.81      Recumbant Bike   Level  3    RPM  50    Watts  63    Minutes  15    METs  3      NuStep   Level  4    SPM  80    Minutes  15    METs  3      Elliptical   Level  1    Speed  2.5    Minutes  15      REL-XR   Level  3    Speed  50    Minutes  15    METs  3      T5 Nustep   Level  4    SPM  80    Minutes  15    METs  3      Prescription Details   Frequency (times per week)  3    Duration  Progress to 30 minutes of continuous aerobic without signs/symptoms of physical distress      Intensity   THRR 40-80% of Max Heartrate  117-162    Ratings of Perceived Exertion  11-13    Perceived Dyspnea  0-4      Progression   Progression  Continue to progress workloads to maintain intensity without signs/symptoms of physical distress.      Resistance Training   Training Prescription  Yes    Weight  5 lbs    Reps  10-15       Exercise Goals: Frequency: Be able to perform aerobic exercise two to three times per week in program working toward 2-5 days per week of home exercise.  Intensity: Work with a perceived exertion of 11 (fairly light) - 15 (hard) while following your exercise prescription.  We will make changes to your prescription with you as you progress through the program.   Duration: Be able to do 30 to 45  minutes of continuous aerobic exercise in addition to a 5 minute warm-up and a 5 minute cool-down routine.   Nutrition Goals: Your personal nutrition goals will be established when you do your nutrition analysis with the dietician.  The following are general nutrition guidelines to follow: Cholesterol < 200mg /day Sodium < 1500mg /day Fiber: Men under 50 yrs - 38 grams per day  Personal Goals: Personal Goals and Risk Factors at Admission - 08/17/19 1349      Core Components/Risk Factors/Patient Goals on Admission    Weight Management  Yes;Weight Loss;Obesity    Intervention  Weight Management: Provide education and appropriate resources to help participant work on and attain dietary goals.;Weight Management: Develop a combined nutrition and exercise program designed to reach desired caloric intake, while maintaining appropriate intake of  nutrient and fiber, sodium and fats, and appropriate energy expenditure required for the weight goal.;Weight Management/Obesity: Establish reasonable short term and long term weight goals.;Obesity: Provide education and appropriate resources to help participant work on and attain dietary goals.    Admit Weight  271 lb 4.8 oz (123.1 kg)    Goal Weight: Short Term  265 lb (120.2 kg)    Goal Weight: Long Term  260 lb (117.9 kg)    Expected Outcomes  Short Term: Continue to assess and modify interventions until short term weight is achieved;Long Term: Adherence to nutrition and physical activity/exercise program aimed toward attainment of established weight goal;Weight Loss: Understanding of general recommendations for a balanced deficit meal plan, which promotes 1-2 lb weight loss per week and includes a negative energy balance of [phone] kcal/d;Understanding recommendations for meals to include 15-35% energy as protein, 25-35% energy from fat, 35-60% energy from carbohydrates, less than 200mg  of dietary cholesterol, 20-35 gm of total fiber daily;Understanding of  distribution of calorie intake throughout the day with the consumption of 4-5 meals/snacks    Tobacco Cessation  Yes    Number of packs per day  1/2    Intervention  Assist the participant in steps to quit. Provide individualized education and counseling about committing to Tobacco Cessation, relapse prevention, and pharmacological support that can be provided by physician.;Advice worker, assist with locating and accessing local/national Quit Smoking programs, and support quit date choice.    Expected Outcomes  Short Term: Will demonstrate readiness to quit, by selecting a quit date.;Short Term: Will quit all tobacco product use, adhering to prevention of relapse plan.;Long Term: Complete abstinence from all tobacco products for at least 12 months from quit date.    Hypertension  Yes    Intervention  Provide education on lifestyle modifcations including regular physical activity/exercise, weight management, moderate sodium restriction and increased consumption of fresh fruit, vegetables, and low fat dairy, alcohol moderation, and smoking cessation.;Monitor prescription use compliance.    Expected Outcomes  Short Term: Continued assessment and intervention until BP is < 140/29mm HG in hypertensive participants. < 130/15mm HG in hypertensive participants with diabetes, heart failure or chronic kidney disease.;Long Term: Maintenance of blood pressure at goal levels.    Lipids  Yes    Intervention  Provide education and support for participant on nutrition & aerobic/resistive exercise along with prescribed medications to achieve LDL 70mg , HDL >40mg .    Expected Outcomes  Short Term: Participant states understanding of desired cholesterol values and is compliant with medications prescribed. Participant is following exercise prescription and nutrition guidelines.;Long Term: Cholesterol controlled with medications as prescribed, with individualized exercise RX and with personalized nutrition  plan. Value goals: LDL < 70mg , HDL > 40 mg.       Tobacco Use Initial Evaluation: Social History   Tobacco Use  Smoking Status Current Every Day Smoker  . Packs/day: 0.50  . Years: 18.00  . Pack years: 9.00  . Types: Cigarettes  Smokeless Tobacco Current User  . Types: Chew  Tobacco Comment   10/29 down to 1/2 ppd, given fake cigarette and quit smart handout    Exercise Goals and Review: Exercise Goals    Row Name 08/17/19 1347             Exercise Goals   Increase Physical Activity  Yes       Intervention  Provide advice, education, support and counseling about physical activity/exercise needs.;Develop an individualized exercise prescription for aerobic and resistive training based on  initial evaluation findings, risk stratification, comorbidities and participant's personal goals.       Expected Outcomes  Short Term: Attend rehab on a regular basis to increase amount of physical activity.;Long Term: Add in home exercise to make exercise part of routine and to increase amount of physical activity.;Long Term: Exercising regularly at least 3-5 days a week.       Increase Strength and Stamina  Yes       Intervention  Provide advice, education, support and counseling about physical activity/exercise needs.;Develop an individualized exercise prescription for aerobic and resistive training based on initial evaluation findings, risk stratification, comorbidities and participant's personal goals.       Expected Outcomes  Short Term: Increase workloads from initial exercise prescription for resistance, speed, and METs.;Short Term: Perform resistance training exercises routinely during rehab and add in resistance training at home;Long Term: Improve cardiorespiratory fitness, muscular endurance and strength as measured by increased METs and functional capacity ( )       Able to understand and use rate of perceived exertion (RPE) scale  Yes       Intervention  Provide education and  explanation on how to use RPE scale       Expected Outcomes  Short Term: Able to use RPE daily in rehab to express subjective intensity level;Long Term:  Able to use RPE to guide intensity level when exercising independently       Knowledge and understanding of Target Heart Rate Range (THRR)  Yes       Intervention  Provide education and explanation of THRR including how the numbers were predicted and where they are located for reference       Expected Outcomes  Short Term: Able to state/look up THRR;Short Term: Able to use daily as guideline for intensity in rehab;Long Term: Able to use THRR to govern intensity when exercising independently       Able to check pulse independently  Yes       Intervention  Provide education and demonstration on how to check pulse in carotid and radial arteries.;Review the importance of being able to check your own pulse for safety during independent exercise       Expected Outcomes  Short Term: Able to explain why pulse checking is important during independent exercise;Long Term: Able to check pulse independently and accurately       Understanding of Exercise Prescription  Yes       Intervention  Provide education, explanation, and written materials on patient's individual exercise prescription       Expected Outcomes  Long Term: Able to explain home exercise prescription to exercise independently;Short Term: Able to explain program exercise prescription          Copy of goals given to participant.

## 2019-08-18 ENCOUNTER — Telehealth: Payer: Self-pay | Admitting: Internal Medicine

## 2019-08-18 ENCOUNTER — Other Ambulatory Visit: Payer: Self-pay

## 2019-08-18 ENCOUNTER — Encounter: Payer: No Typology Code available for payment source | Admitting: *Deleted

## 2019-08-18 DIAGNOSIS — I214 Non-ST elevation (NSTEMI) myocardial infarction: Secondary | ICD-10-CM | POA: Diagnosis not present

## 2019-08-18 NOTE — Progress Notes (Signed)
Daily Session Note  Patient Details  Name: Marcus Bauer MRN: [id number] Date of Birth: April 16, 1981 Referring Provider:     Cardiac Rehab from 08/17/2019 in [facility]  Referring Provider  End, Harrell Gave MD      Encounter Date: 08/18/2019  Check In: Session Check In - 08/18/19 0915      Check-In   Supervising physician immediately available to respond to emergencies  See telemetry face sheet for immediately available ER MD    Location  [facility]-Cardiac & Pulmonary Rehab    Staff Present  Heath Lark, RN, BSN, CCRP;Joseph Hood RCP,RRT,BSRT;[facility], [place], ACSM CEP, Exercise Physiologist    Virtual Visit  No    Medication changes reported      No    Fall or balance concerns reported     No    Tobacco Cessation  No Change   10 cigs   Warm-up and Cool-down  Performed on first and last piece of equipment    Resistance Training Performed  Yes    VAD Patient?  No    PAD/SET Patient?  No      Pain Assessment   Currently in Pain?  No/denies          Social History   Tobacco Use  Smoking Status Current Every Day Smoker  . Packs/day: 0.50  . Years: 18.00  . Pack years: 9.00  . Types: Cigarettes  Smokeless Tobacco Current User  . Types: Chew  Tobacco Comment   10/29 down to 1/2 ppd, given fake cigarette and quit smart handout    Goals Met:  Exercise tolerated well Personal goals reviewed No report of cardiac concerns or symptoms  Goals Unmet:  Not Applicable  Comments: First full day of exercise!  Patient was oriented to gym and equipment including functions, settings, policies, and procedures.  Patient's individual exercise prescription and treatment plan were reviewed.  All starting workloads were established based on the results of the 6 minute walk test done at initial orientation visit.  The plan for exercise progression was also introduced and progression will be customized based on patient's performance and goals.    Dr. Emily Filbert  is Medical Director for [place] and [facility].

## 2019-08-18 NOTE — Telephone Encounter (Signed)
[employer] sent disability and FMLA forms to be completed Placed in interoffice mail and sent to [facility]

## 2019-08-21 ENCOUNTER — Encounter: Payer: No Typology Code available for payment source | Attending: Internal Medicine | Admitting: *Deleted

## 2019-08-21 ENCOUNTER — Other Ambulatory Visit: Payer: Self-pay

## 2019-08-21 DIAGNOSIS — Z79899 Other long term (current) drug therapy: Secondary | ICD-10-CM | POA: Diagnosis not present

## 2019-08-21 DIAGNOSIS — Z7901 Long term (current) use of anticoagulants: Secondary | ICD-10-CM | POA: Diagnosis not present

## 2019-08-21 DIAGNOSIS — Z7902 Long term (current) use of antithrombotics/antiplatelets: Secondary | ICD-10-CM | POA: Insufficient documentation

## 2019-08-21 DIAGNOSIS — Z7982 Long term (current) use of aspirin: Secondary | ICD-10-CM | POA: Diagnosis not present

## 2019-08-21 DIAGNOSIS — I214 Non-ST elevation (NSTEMI) myocardial infarction: Secondary | ICD-10-CM | POA: Insufficient documentation

## 2019-08-21 DIAGNOSIS — K219 Gastro-esophageal reflux disease without esophagitis: Secondary | ICD-10-CM | POA: Diagnosis not present

## 2019-08-21 DIAGNOSIS — F17210 Nicotine dependence, cigarettes, uncomplicated: Secondary | ICD-10-CM | POA: Diagnosis not present

## 2019-08-21 DIAGNOSIS — I10 Essential (primary) hypertension: Secondary | ICD-10-CM | POA: Insufficient documentation

## 2019-08-21 DIAGNOSIS — F419 Anxiety disorder, unspecified: Secondary | ICD-10-CM | POA: Diagnosis not present

## 2019-08-21 NOTE — Progress Notes (Signed)
Daily Session Note  Patient Details  Name: Marcus Bauer MRN: [id number] Date of Birth: 12/29/1980 Referring Provider:     Cardiac Rehab from 08/17/2019 in [facility]  Referring Provider  End, Harrell Gave MD      Encounter Date: 08/21/2019  Check In: Session Check In - 08/21/19 0854      Check-In   Supervising physician immediately available to respond to emergencies  See telemetry face sheet for immediately available ER MD    Location  [facility]Cardiac [facility] Pul[facility] Re[facility]    Staff Present  Heath Lark, RN, BSN, CCRP;Jessica Delia, MA, RCEP, CCRP, CCET;Joseph [place] RCP,RRT,BSRT    Virtual Visit  No    Medication changes reported      No    Fall or balance concerns reported     No    Warm-up and Cool-down  Performed on first and last piece of equipment    Resistance Training Performed  Yes    VAD Patient?  No    PAD/SET Patient?  No      Pain Assessment   Currently in Pain?  No/denies          Social History   Tobacco Use  Smoking Status Current Every Day Smoker  . Packs/day: 0.50  . Years: 18.00  . Pack years: 9.00  . Types: Cigarettes  Smokeless Tobacco Current User  . Types: Chew  Tobacco Comment   10/29 down to 1/2 ppd, given fake cigarette and quit smart handout    Goals Met:  Exercise tolerated well Personal goals reviewed No report of cardiac concerns or symptoms  Goals Unmet:  Not Applicable  Comments:Pt able to follow exercise prescription today without complaint.  Will continue to monitor for progression.  Reviewed home exercise with pt today.  Pt plans to walk at home for exercise.  He is also considering rejoining at gym [employer]) and to start running again. Reviewed THR, pulse, RPE, sign and symptoms, NTG use, and when to call 911 or MD.  Also discussed weather considerations and indoor options.  Pt voiced understanding.   Dr. Emily Filbert is Medical Director for [place] and [facility].

## 2019-08-23 ENCOUNTER — Encounter: Payer: Self-pay | Admitting: *Deleted

## 2019-08-23 DIAGNOSIS — I25118 Atherosclerotic heart disease of native coronary artery with other forms of angina pectoris: Secondary | ICD-10-CM | POA: Insufficient documentation

## 2019-08-23 DIAGNOSIS — I214 Non-ST elevation (NSTEMI) myocardial infarction: Secondary | ICD-10-CM

## 2019-08-23 DIAGNOSIS — F411 Generalized anxiety disorder: Secondary | ICD-10-CM | POA: Insufficient documentation

## 2019-08-23 NOTE — Progress Notes (Signed)
Cardiac Individual Treatment Plan  Patient Details  Name: Marcus Bauer MRN: [id number] Date of Birth: Dec 27, 1980 Referring Provider:     Cardiac Rehab from 08/17/2019 in [facility] Cardiac and Pulmonary Rehab  Referring Provider  End, Harrell Gave MD      Initial Encounter Date:    Cardiac Rehab from 08/17/2019 in [facility] Cardiac and Pulmonary Rehab  Date  08/17/19      Visit Diagnosis: NSTEMI (non-ST elevated myocardial infarction) [facility])  Patient's Home Medications on Admission:  Current Outpatient Medications:  .  albuterol (PROVENTIL HFA;VENTOLIN HFA) 108 (90 Base) MCG/ACT inhaler, Inhale 2 puffs into the lungs every 6 (six) hours as needed for wheezing or shortness of breath., Disp: , Rfl:  .  ALPRAZolam (XANAX) 0.25 MG tablet, Take 1 tablet (0.25 mg total) by mouth 3 (three) times daily as needed for anxiety., Disp: 30 tablet, Rfl: 0 .  aspirin EC 81 MG EC tablet, Take 1 tablet (81 mg total) by mouth daily., Disp: 30 tablet, Rfl: 0 .  atorvastatin (LIPITOR) 80 MG tablet, Take 1 tablet (80 mg total) by mouth daily at 6 PM., Disp: 60 tablet, Rfl: 0 .  calcium carbonate (TUMS - DOSED IN MG ELEMENTAL CALCIUM) 500 MG chewable tablet, Chew 1 tablet by mouth as needed for indigestion or heartburn., Disp: , Rfl:  .  carvedilol (COREG) 25 MG tablet, Take 1 tablet (25 mg total) by mouth 2 (two) times daily., Disp: 60 tablet, Rfl: 11 .  clopidogrel (PLAVIX) 75 MG tablet, Take 1 tablet (75 mg total) by mouth daily with breakfast., Disp: 30 tablet, Rfl: 0 .  losartan (COZAAR) 50 MG tablet, Take 2 tablets (100 mg total) by mouth daily., Disp: 60 tablet, Rfl: 11 .  nitroGLYCERIN (NITROSTAT) 0.4 MG SL tablet, Place 1 tablet (0.4 mg total) under the tongue every 5 (five) minutes x 3 doses as needed for chest pain., Disp: 30 tablet, Rfl: 12 .  oxyCODONE-acetaminophen (PERCOCET/ROXICET) 5-325 MG tablet, Take 1 tablet by mouth every 6 (six) hours as needed for moderate pain., Disp: 25 tablet, Rfl: 0  Past  Medical History: Past Medical History:  Diagnosis Date  . Anxiety    NO MEDS  . Asthma    WELL CONTROLLED  . GERD (gastroesophageal reflux disease)    OCC TUMS  . Hypertension   . Sleep apnea    WILL OCC USE NOT EVERY DAY    Tobacco Use: Social History   Tobacco Use  Smoking Status Current Every Day Smoker  . Packs/day: 0.50  . Years: 18.00  . Pack years: 9.00  . Types: Cigarettes  Smokeless Tobacco Current User  . Types: Chew  Tobacco Comment   10/29 down to 1/2 ppd, given fake cigarette and quit smart handout    Labs: Recent Review Flowsheet Data    Labs for ITP Cardiac and Pulmonary Rehab Latest Ref Rng & Units 08/12/2019 08/13/2019   Cholestrol 0 - 200 mg/dL - 139   LDLCALC 0 - 99 mg/dL - 73   HDL >40 mg/dL - 42   Trlycerides <150 mg/dL - 118   Hemoglobin A1c 4.8 - 5.6 % 5.6 -       Exercise Target Goals: Exercise Program Goal: Individual exercise prescription set using results from initial 6 min walk test and THRR while considering  patient's activity barriers and safety.   Exercise Prescription Goal: Initial exercise prescription builds to 30-45 minutes a day of aerobic activity, 2-3 days per week.  Home exercise guidelines will be given  to patient during program as part of exercise prescription that the participant will acknowledge.  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 HR  106 bpm     Max Ex. BP  144/74     2 Minute Post BP  136/72        Oxygen Initial Assessment:   Oxygen Re-Evaluation:   Oxygen Discharge (Final Oxygen Re-Evaluation):   Initial Exercise Prescription: Initial Exercise Prescription - 08/17/19 1300      Date of Initial Exercise RX and Referring Provider   Date  08/17/19    Referring Provider  End, Harrell Gave MD      Treadmill   MPH  2    Grade  1    Minutes  15    METs  2.81      Recumbant Bike   Level  3    RPM  50    Watts  63    Minutes  15    METs  3      NuStep   Level  4    SPM  80    Minutes  15    METs  3      Elliptical   Level  1    Speed  2.5    Minutes  15      REL-XR   Level  3    Speed  50    Minutes  15    METs  3      T5 Nustep   Level  4    SPM  80    Minutes  15    METs  3      Prescription Details   Frequency (times per week)  3    Duration  Progress to 30 minutes of continuous aerobic without signs/symptoms of physical distress      Intensity   THRR 40-80% of Max Heartrate  117-162    Ratings of Perceived Exertion  11-13    Perceived Dyspnea  0-4      Progression   Progression  Continue to progress workloads to maintain intensity without signs/symptoms of physical distress.      Resistance Training   Training Prescription  Yes    Weight  5 lbs    Reps  10-15       Perform Capillary Blood Glucose checks as needed.  Exercise Prescription Changes: Exercise Prescription Changes  [place] Name 08/17/19 1300 08/21/19 0900           Response to Exercise   Blood Pressure (Admit)  134/68  -      Blood Pressure (Exercise)  144/74  -      Blood Pressure (Exit)  136/72  -      Heart Rate (Admit)  71 bpm  -      Heart Rate (Exercise)  106 bpm  -      Heart Rate (Exit)  73 bpm  -      Oxygen Saturation (Admit)  97 %  -      Oxygen Saturation (Exercise)  96 %  -      Rating of  Perceived Exertion (Exercise)  7  -      Symptoms  none  -      Comments  walk test results  -        Home Exercise Plan   Plans to continue exercise at  -  Home (comment) walk      Frequency  -  Add 2 additional days to program exercise sessions.      Initial Home Exercises Provided  -  08/21/19         Exercise Comments: Exercise Comments    [place] Name 08/18/19 [phone]           Exercise Comments  First full day of exercise!  Patient was oriented to gym and equipment including functions, settings, policies, and procedures.  Patient's individual exercise prescription and treatment plan were reviewed.  All starting workloads were established based on the results of the 6 minute walk test done at initial orientation visit.  The plan for exercise progression was also introduced and progression will be customized based on patient's performance and goals.          Exercise Goals and Review: Exercise Goals    Row Name 08/17/19 1347             Exercise Goals   Increase Physical Activity  Yes       Intervention  Provide advice, education, support and counseling about physical activity/exercise needs.;Develop an individualized exercise prescription for aerobic and resistive training based on initial evaluation findings, risk stratification, comorbidities and participant's personal goals.       Expected Outcomes  Short Term: Attend rehab on a regular basis to increase amount of physical activity.;Long Term: Add in home exercise to make exercise part of routine and to increase amount of physical activity.;Long Term: Exercising regularly at least 3-5 days a week.       Increase Strength and Stamina  Yes       Intervention  Provide advice, education, support and counseling about physical activity/exercise needs.;Develop an individualized exercise prescription for aerobic and resistive training based on initial evaluation findings, risk stratification, comorbidities and participant's personal  goals.       Expected Outcomes  Short Term: Increase workloads from initial exercise prescription for resistance, speed, and METs.;Short Term: Perform resistance training exercises routinely during rehab and add in resistance training at home;Long Term: Improve cardiorespiratory fitness, muscular endurance and strength as measured by increased METs and functional capacity (6MWT)       Able to understand and use rate of perceived exertion (RPE) scale  Yes       Intervention  Provide education and explanation on how to use RPE scale       Expected Outcomes  Short Term: Able to use RPE daily  in rehab to express subjective intensity level;Long Term:  Able to use RPE to guide intensity level when exercising independently       Knowledge and understanding of Target Heart Rate Range (THRR)  Yes       Intervention  Provide education and explanation of THRR including how the numbers were predicted and where they are located for reference       Expected Outcomes  Short Term: Able to state/look up THRR;Short Term: Able to use daily as guideline for intensity in rehab;Long Term: Able to use THRR to govern intensity when exercising independently       Able to check pulse independently  Yes       Intervention  Provide education and demonstration on how to check pulse in carotid and radial arteries.;Review the importance of being able to check your own pulse for safety during independent exercise       Expected Outcomes  Short Term: Able to explain why pulse checking is important during independent exercise;Long Term: Able to check pulse independently and accurately       Understanding of Exercise Prescription  Yes       Intervention  Provide education, explanation, and written materials on patient's individual exercise prescription       Expected Outcomes  Long Term: Able to explain home exercise prescription to exercise independently;Short Term: Able to explain program exercise prescription          Exercise  Goals Re-Evaluation : Exercise Goals Re-Evaluation    [place] Name 08/18/19 0916 08/21/19 0935           Exercise Goal Re-Evaluation   Exercise Goals Review  Knowledge and understanding of Target Heart Rate Range (THRR);Understanding of Exercise Prescription;Able to understand and use rate of perceived exertion (RPE) scale  Knowledge and understanding of Target Heart Rate Range (THRR);Understanding of Exercise Prescription;Able to understand and use rate of perceived exertion (RPE) scale;Increase Strength and Stamina;Increase Physical Activity;Able to understand and use Dyspnea scale;Able to check pulse independently      Comments  Reviewed RPE scale, THR and program prescription with pt today.  Pt voiced understanding and was given a copy of goals to take home.  Reviewed home exercise with pt today.  Pt plans to walk at home for exercise.  He is also considering rejoining at gym [employer]) and to start running again. Reviewed THR, pulse, RPE, sign and symptoms, NTG use, and when to call 911 or MD.  Also discussed weather considerations and indoor options.  Pt voiced understanding.      Expected Outcomes  Short: Use RPE daily to regulate intensity. Long: Follow program prescription in THR.  Short: Start to walk at home on off days.  Long: Continue to exercise independently.         Discharge Exercise Prescription (Final Exercise Prescription Changes): Exercise Prescription Changes - 08/21/19 0900      Home Exercise Plan   Plans to continue exercise at  Home (comment)   walk   Frequency  Add 2 additional days to program exercise sessions.    Initial Home Exercises Provided  08/21/19       Nutrition:  Target Goals: Understanding of nutrition guidelines, daily intake of sodium <1565m, cholesterol <2037m calories 30% from fat and 7% or less from saturated fats, daily to have 5 or more servings of fruits and vegetables.  Biometrics: Pre Biometrics - 08/17/19 1347      Pre Biometrics    Height  5' 6.1" (1.679  m)    Weight  271 lb 4.8 oz (123.1 kg)    BMI (Calculated)  43.65    Single Leg Stand  30 seconds        Nutrition Therapy Plan and Nutrition Goals:   Nutrition Assessments: Nutrition Assessments - 08/17/19 1348      MEDFICTS Scores   Pre Score  62       Nutrition Goals Re-Evaluation:   Nutrition Goals Discharge (Final Nutrition Goals Re-Evaluation):   Psychosocial: Target Goals: Acknowledge presence or absence of significant depression and/or stress, maximize coping skills, provide positive support system. Participant is able to verbalize types and ability to use techniques and skills needed for reducing stress and depression.   Initial Review & Psychosocial Screening: Initial Psych Review & Screening - 08/16/19 1031      Initial Review   Current issues with  Current Stress Concerns;Current Anxiety/Panic;Current Psychotropic Meds;Current Depression    Source of Stress Concerns  Occupation;Unable to perform yard/household activities;Family    Comments  Currently out on FMLA for work.  Family Obligations to care for them and house, Current anxiety on xanax and some depression      Family Dynamics   Good Support System?  Yes   wife, family, friends     Barriers   Psychosocial barriers to participate in program  The patient should benefit from training in stress management and relaxation.;Psychosocial barriers identified (see note)      Screening Interventions   Interventions  Encouraged to exercise;To provide support and resources with identified psychosocial needs;Provide feedback about the scores to participant    Expected Outcomes  Short Term goal: Utilizing psychosocial counselor, staff and physician to assist with identification of specific Stressors or current issues interfering with healing process. Setting desired goal for each stressor or current issue identified.;Long Term Goal: Stressors or current issues are controlled or eliminated.;Short  Term goal: Identification and review with participant of any Quality of Life or Depression concerns found by scoring the questionnaire.;Long Term goal: The participant improves quality of Life and PHQ9 Scores as seen by post scores and/or verbalization of changes       Quality of Life Scores:  Quality of Life - 08/17/19 1348      Quality of Life   Select  Quality of Life      Quality of Life Scores   Health/Function Pre  15.73 %    Socioeconomic Pre  16.07 %    Psych/Spiritual Pre  8.64 %    Family Pre  21.6 %    GLOBAL Pre  15.2 %      Scores of 19 and below usually indicate a poorer quality of life in these areas.  A difference of  2-3 points is a clinically meaningful difference.  A difference of 2-3 points in the total score of the Quality of Life Index has been associated with significant improvement in overall quality of life, self-image, physical symptoms, and general health in studies assessing change in quality of life.  PHQ-9: Recent Review Flowsheet Data    Depression screen [facility] 2/9 08/17/2019   Decreased Interest 0   Down, Depressed, Hopeless 3   PHQ - 2 Score 3   Altered sleeping 1   Tired, decreased energy 2   Change in appetite 0   Feeling bad or failure about yourself  1   Trouble concentrating 0   Moving slowly or fidgety/restless 2   Suicidal thoughts 0   PHQ-9 Score 9   Difficult doing  work/chores Somewhat difficult     Interpretation of Total Score  Total Score Depression Severity:  1-4 = Minimal depression, 5-9 = Mild depression, 10-14 = Moderate depression, 15-19 = Moderately severe depression, 20-27 = Severe depression   Psychosocial Evaluation and Intervention: Psychosocial Evaluation - 08/16/19 1035      Psychosocial Evaluation & Interventions   Interventions  Encouraged to exercise with the program and follow exercise prescription;Stress management education    Comments  He has already started to walk and get into routine.  He is already trying  to work on improving his diet and taking his medicine.  He is trying to stay postivie and get into habit of taking his medicaitons.  He has a great support system and gets lots of family and friends to stop by and check in.    Expected Outcomes  Short: Try to use exercise to help cope with stress.  Long: Continue to stay positive.    Continue Psychosocial Services   Follow up required by staff       Psychosocial Re-Evaluation:   Psychosocial Discharge (Final Psychosocial Re-Evaluation):   Vocational Rehabilitation: Provide vocational rehab assistance to qualifying candidates.   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 Participant will state understanding/return demonstration of topics presented as noted by education test scores.  Learning Barriers/Preferences: Learning Barriers/Preferences - 08/16/19 1029      Learning Barriers/Preferences   Learning Barriers  Sight   glassess   Learning Preferences  None       Education Topics:  AED/CPR: - Group verbal and written instruction with the use of models to demonstrate the basic use of the AED with the basic ABC's of resuscitation.   General Nutrition Guidelines/Fats and Fiber: -Group instruction provided by verbal, written material, models and posters to present the general guidelines for heart healthy nutrition. Gives an explanation and review of dietary fats and fiber.   Controlling Sodium/Reading Food Labels: -Group verbal and written material supporting the discussion of sodium use in heart healthy nutrition. Review and explanation with  models, verbal and written materials for utilization of the food label.   Exercise Physiology & General Exercise Guidelines: - Group verbal and written instruction with models to review the exercise physiology of the cardiovascular system and associated critical values. Provides general exercise guidelines with specific guidelines to those with heart or lung disease.    Aerobic Exercise & Resistance Training: - Gives group verbal and written instruction on the various components of exercise. Focuses on aerobic and resistive training programs and the benefits of this training and how to safely progress through these programs..   Flexibility, Balance, Mind/Body Relaxation: Provides group verbal/written instruction on the benefits of flexibility and balance training, including mind/body exercise modes such as yoga, pilates and tai chi.  Demonstration and skill practice provided.   Stress and Anxiety: - Provides group verbal and written instruction about the health risks of elevated stress and causes of high stress.  Discuss the correlation between heart/lung disease and anxiety and treatment options. Review healthy ways to manage with stress and anxiety.   Depression: - Provides group verbal and written instruction  on the correlation between heart/lung disease and depressed mood, treatment options, and the stigmas associated with seeking treatment.   Anatomy & Physiology of the Heart: - Group verbal and written instruction and models provide basic cardiac anatomy and physiology, with the coronary electrical and arterial systems. Review of Valvular disease and Heart Failure   Cardiac Procedures: - Group verbal and written instruction to review commonly prescribed medications for heart disease. Reviews the medication, class of the drug, and side effects. Includes the steps to properly store meds and maintain the prescription regimen. (beta blockers and nitrates)   Cardiac Medications I: -  Group verbal and written instruction to review commonly prescribed medications for heart disease. Reviews the medication, class of the drug, and side effects. Includes the steps to properly store meds and maintain the prescription regimen.   Cardiac Medications II: -Group verbal and written instruction to review commonly prescribed medications for heart disease. Reviews the medication, class of the drug, and side effects. (all other drug classes)    Go Sex-Intimacy & Heart Disease, Get SMART - Goal Setting: - Group verbal and written instruction through game format to discuss heart disease and the return to sexual intimacy. Provides group verbal and written material to discuss and apply goal setting through the application of the S.M.A.R.T. Method.   Other Matters of the Heart: - Provides group verbal, written materials and models to describe Stable Angina and Peripheral Artery. Includes description of the disease process and treatment options available to the cardiac patient.   Exercise & Equipment Safety: - Individual verbal instruction and demonstration of equipment use and safety with use of the equipment.   Cardiac Rehab from 08/17/2019 in [facility] Cardiac and Pulmonary Rehab  Date  08/17/19  Educator  [facility]  Instruction Review Code  1- Verbalizes Understanding      Infection Prevention: - Provides verbal and written material to individual with discussion of infection control including proper hand washing and proper equipment cleaning during exercise session.   Cardiac Rehab from 08/17/2019 in [facility] Cardiac and Pulmonary Rehab  Date  08/17/19  Educator  [facility]  Instruction Review Code  1- Verbalizes Understanding      Falls Prevention: - Provides verbal and written material to individual with discussion of falls prevention and safety.   Cardiac Rehab from 08/17/2019 in [facility] Cardiac and Pulmonary Rehab  Date  08/17/19  Educator  [facility]  Instruction Review Code  1- Verbalizes Understanding       Diabetes: - Individual verbal and written instruction to review signs/symptoms of diabetes, desired ranges of glucose level fasting, after meals and with exercise. Acknowledge that pre and post exercise glucose checks will be done for 3 sessions at entry of program.   Know Your Numbers and Risk Factors: -Group verbal and written instruction about important numbers in your health.  Discussion of what are risk factors and how they play a role in the disease process.  Review of Cholesterol, Blood Pressure, Diabetes, and BMI and the role they play in your overall health.   Sleep Hygiene: -Provides group verbal and written instruction about how sleep can affect your health.  Define sleep hygiene, discuss sleep cycles and impact of sleep habits. Review good sleep hygiene tips.    Other: -Provides group and verbal instruction on various topics (see comments)   Knowledge Questionnaire Score: Knowledge Questionnaire Score - 08/17/19 1348      Knowledge Questionnaire Score   Pre Score  20/26 Education Focus: A&P, Angina, Nurtition, Exercise  Core Components/Risk Factors/Patient Goals at Admission: Personal Goals and Risk Factors at Admission - 08/17/19 1349      Core Components/Risk Factors/Patient Goals on Admission    Weight Management  Yes;Weight Loss;Obesity    Intervention  Weight Management: Provide education and appropriate resources to help participant work on and attain dietary goals.;Weight Management: Develop a combined nutrition and exercise program designed to reach desired caloric intake, while maintaining appropriate intake of nutrient and fiber, sodium and fats, and appropriate energy expenditure required for the weight goal.;Weight Management/Obesity: Establish reasonable short term and long term weight goals.;Obesity: Provide education and appropriate resources to help participant work on and attain dietary goals.    Admit Weight  271 lb 4.8 oz (123.1 kg)    Goal  Weight: Short Term  265 lb (120.2 kg)    Goal Weight: Long Term  260 lb (117.9 kg)    Expected Outcomes  Short Term: Continue to assess and modify interventions until short term weight is achieved;Long Term: Adherence to nutrition and physical activity/exercise program aimed toward attainment of established weight goal;Weight Loss: Understanding of general recommendations for a balanced deficit meal plan, which promotes 1-2 lb weight loss per week and includes a negative energy balance of [phone] kcal/d;Understanding recommendations for meals to include 15-35% energy as protein, 25-35% energy from fat, 35-60% energy from carbohydrates, less than 243m of dietary cholesterol, 20-35 gm of total fiber daily;Understanding of distribution of calorie intake throughout the day with the consumption of 4-5 meals/snacks    Tobacco Cessation  Yes    Number of packs per day  1/2    Intervention  Assist the participant in steps to quit. Provide individualized education and counseling about committing to Tobacco Cessation, relapse prevention, and pharmacological support that can be provided by physician.;OAdvice worker assist with locating and accessing local/national Quit Smoking programs, and support quit date choice.    Expected Outcomes  Short Term: Will demonstrate readiness to quit, by selecting a quit date.;Short Term: Will quit all tobacco product use, adhering to prevention of relapse plan.;Long Term: Complete abstinence from all tobacco products for at least 12 months from quit date.    Hypertension  Yes    Intervention  Provide education on lifestyle modifcations including regular physical activity/exercise, weight management, moderate sodium restriction and increased consumption of fresh fruit, vegetables, and low fat dairy, alcohol moderation, and smoking cessation.;Monitor prescription use compliance.    Expected Outcomes  Short Term: Continued assessment and intervention until BP is <  140/916mHG in hypertensive participants. < 130/8012mG in hypertensive participants with diabetes, heart failure or chronic kidney disease.;Long Term: Maintenance of blood pressure at goal levels.    Lipids  Yes    Intervention  Provide education and support for participant on nutrition & aerobic/resistive exercise along with prescribed medications to achieve LDL <44m50mDL >40mg66m Expected Outcomes  Short Term: Participant states understanding of desired cholesterol values and is compliant with medications prescribed. Participant is following exercise prescription and nutrition guidelines.;Long Term: Cholesterol controlled with medications as prescribed, with individualized exercise RX and with personalized nutrition plan. Value goals: LDL < 44mg,77m > 40 mg.       Core Components/Risk Factors/Patient Goals Review:    Core Components/Risk Factors/Patient Goals at Discharge (Final Review):    ITP Comments: ITP Comments    Row Name 08/16/19 1057 08/17/19 1342 08/18/19 0916 08/21/19 0855 08/23/19 0718   ITP Comments  Completed virtual orientation.  Documentation for diagnosis can  be found in [facility] encounter [facility]12/19.  EP eval scheduled for 11am tomorrow.  Completed 6MWT and gym orientation.  Initial ITP created and sent for review to Dr. Emily Filbert, Medical Director.  First full day of exercise!  Patient was oriented to gym and equipment including functions, settings, policies, and procedures.  Patient's individual exercise prescription and treatment plan were reviewed.  All starting workloads were established based on the results of the 6 minute walk test done at initial orientation visit.  The plan for exercise progression was also introduced and progression will be customized based on patient's performance and goals.  -  30 day review completed. Continue with ITP sent to Dr. Emily Filbert, Medical Director of Cardiac and Pulmonary Rehab for review , changes as needed and signature.   New to program       Comments:

## 2019-08-24 NOTE — Telephone Encounter (Signed)
Marcus Bauer

## 2019-08-25 ENCOUNTER — Encounter: Payer: Self-pay | Admitting: *Deleted

## 2019-08-25 ENCOUNTER — Encounter: Payer: No Typology Code available for payment source | Admitting: *Deleted

## 2019-08-25 ENCOUNTER — Other Ambulatory Visit: Payer: Self-pay

## 2019-08-25 DIAGNOSIS — I214 Non-ST elevation (NSTEMI) myocardial infarction: Secondary | ICD-10-CM

## 2019-08-25 NOTE — Progress Notes (Signed)
Daily Session Note  Patient Details  Name: Marcus Bauer MRN: [id number] Date of Birth: 1981/09/28 Referring Provider:     Cardiac Rehab from 08/17/2019 in [facility] Cardiac and Pulmonary Rehab  Referring Provider  End, Harrell Gave MD      Encounter Date: 08/25/2019  Check In: Session Check In - 08/25/19 0911      Check-In   Supervising physician immediately available to respond to emergencies  See telemetry face sheet for immediately available ER MD    Location  [facility]-Cardiac & Pulmonary Rehab    Staff Present  Heath Lark, RN, BSN, CCRP;Jessica Bradbury, MA, RCEP, CCRP, CCET    Virtual Visit  No    Medication changes reported      Yes    Fall or balance concerns reported     No    Tobacco Cessation  Use Decreased   down to 1/4 pack a day   Warm-up and Cool-down  Performed on first and last piece of equipment    Resistance Training Performed  Yes    VAD Patient?  No    PAD/SET Patient?  No      Pain Assessment   Currently in Pain?  No/denies          Social History   Tobacco Use  Smoking Status Current Every Day Smoker  . Packs/day: 0.50  . Years: 18.00  . Pack years: 9.00  . Types: Cigarettes  Smokeless Tobacco Current User  . Types: Chew  Tobacco Comment   10/29 down to 1/2 ppd, given fake cigarette and quit smart handout    Goals Met:  Independence with exercise equipment Exercise tolerated well No report of cardiac concerns or symptoms  Goals Unmet:  Not Applicable  Comments: Pt able to follow exercise prescription today without complaint.  Will continue to monitor for progression.    Dr. Emily Filbert is Medical Director for [place] and [facility].

## 2019-08-28 ENCOUNTER — Other Ambulatory Visit: Payer: Self-pay

## 2019-08-28 ENCOUNTER — Encounter: Payer: No Typology Code available for payment source | Admitting: *Deleted

## 2019-08-28 DIAGNOSIS — I214 Non-ST elevation (NSTEMI) myocardial infarction: Secondary | ICD-10-CM | POA: Diagnosis not present

## 2019-08-28 NOTE — Progress Notes (Signed)
Daily Session Note  Patient Details  Name: Marcus Bauer MRN: [id number] Date of Birth: 11-05-1980 Referring Provider:     Cardiac Rehab from 08/17/2019 in [facility] Cardiac and Pulmonary Rehab  Referring Provider  End, Harrell Gave MD      Encounter Date: 08/28/2019  Check In: Session Check In - 08/28/19 0917      Check-In   Supervising physician immediately available to respond to emergencies  See telemetry face sheet for immediately available ER MD    Location  [facility]-Cardiac & Pulmonary Rehab    Staff Present  Heath Lark, RN, BSN, CCRP;Meredith Sherryll Burger, RN Moises Blood, BS, ACSM CEP, Exercise Physiologist    Virtual Visit  No    Medication changes reported      No    Fall or balance concerns reported     No    Warm-up and Cool-down  Performed on first and last piece of equipment    Resistance Training Performed  Yes    VAD Patient?  No    PAD/SET Patient?  No      Pain Assessment   Currently in Pain?  No/denies          Social History   Tobacco Use  Smoking Status Current Every Day Smoker  . Packs/day: 0.50  . Years: 18.00  . Pack years: 9.00  . Types: Cigarettes  Smokeless Tobacco Current User  . Types: Chew  Tobacco Comment   down to 1/4 pack a day 08/25/2019    Goals Met:  Independence with exercise equipment Exercise tolerated well No report of cardiac concerns or symptoms  Goals Unmet:  Not Applicable  Comments: Pt able to follow exercise prescription today without complaint.  Will continue to monitor for progression.    Dr. Emily Filbert is Medical Director for [place] and [facility].

## 2019-08-30 ENCOUNTER — Other Ambulatory Visit: Payer: Self-pay

## 2019-08-30 ENCOUNTER — Encounter: Payer: No Typology Code available for payment source | Admitting: *Deleted

## 2019-08-30 DIAGNOSIS — I214 Non-ST elevation (NSTEMI) myocardial infarction: Secondary | ICD-10-CM | POA: Diagnosis not present

## 2019-08-30 NOTE — Progress Notes (Signed)
Daily Session Note  Patient Details  Name: Marcus Bauer MRN: [id number] Date of Birth: Jun 07, 1981 Referring Provider:     Cardiac Rehab from 08/17/2019 in [facility] Cardiac and Pulmonary Rehab  Referring Provider  End, Harrell Gave MD      Encounter Date: 08/30/2019  Check In: Session Check In - 08/30/19 0920      Check-In   Supervising physician immediately available to respond to emergencies  See telemetry face sheet for immediately available ER MD    Location  [facility]-Cardiac & Pulmonary Rehab    Staff Present  Heath Lark, RN, BSN, CCRP;Jeanna Durrell BS, Exercise Physiologist;Joseph Hood RCP,RRT,BSRT    Virtual Visit  No    Medication changes reported      No    Fall or balance concerns reported     No    Warm-up and Cool-down  Performed on first and last piece of equipment    Resistance Training Performed  Yes    VAD Patient?  No    PAD/SET Patient?  No      Pain Assessment   Currently in Pain?  No/denies          Social History   Tobacco Use  Smoking Status Current Every Day Smoker  . Packs/day: 0.50  . Years: 18.00  . Pack years: 9.00  . Types: Cigarettes  Smokeless Tobacco Current User  . Types: Chew  Tobacco Comment   down to 1/4 pack a day 08/25/2019    Goals Met:  Independence with exercise equipment Exercise tolerated well No report of cardiac concerns or symptoms  Goals Unmet:  Not Applicable  Comments: Pt able to follow exercise prescription today without complaint.  Will continue to monitor for progression. BP up on arrival.  Had not taken meds today. Planned to go home take meds and recheck BP after 30-45 min.  To call MD if BP remains elevated   Dr. Emily Filbert is Medical Director for [place] and [facility].

## 2019-08-31 DIAGNOSIS — I214 Non-ST elevation (NSTEMI) myocardial infarction: Secondary | ICD-10-CM

## 2019-08-31 NOTE — Progress Notes (Signed)
Initial RD eval completed 

## 2019-09-01 ENCOUNTER — Encounter: Payer: No Typology Code available for payment source | Admitting: *Deleted

## 2019-09-01 ENCOUNTER — Other Ambulatory Visit: Payer: Self-pay

## 2019-09-01 DIAGNOSIS — I214 Non-ST elevation (NSTEMI) myocardial infarction: Secondary | ICD-10-CM

## 2019-09-01 NOTE — Progress Notes (Signed)
Daily Session Note  Patient Details  Name: Marcus Bauer MRN: [id number] Date of Birth: 09/23/1981 Referring Provider:     Cardiac Rehab from 08/17/2019 in [facility] Cardiac and Pulmonary Rehab  Referring Provider  End, Harrell Gave MD      Encounter Date: 09/01/2019  Check In: Session Check In - 09/01/19 0907      Check-In   Supervising physician immediately available to respond to emergencies  See telemetry face sheet for immediately available ER MD    Location  [facility]-Cardiac & Pulmonary Rehab    Staff Present  Heath Lark, RN, BSN, CCRP;Jessica [place], MA, RCEP, CCRP, CCET;Joseph Glencoe RCP,RRT,BSRT    Virtual Visit  No    Medication changes reported      No    Fall or balance concerns reported     No    Warm-up and Cool-down  Performed on first and last piece of equipment    Resistance Training Performed  Yes    VAD Patient?  No    PAD/SET Patient?  No      Pain Assessment   Currently in Pain?  No/denies          Social History   Tobacco Use  Smoking Status Current Every Day Smoker  . Packs/day: 0.50  . Years: 18.00  . Pack years: 9.00  . Types: Cigarettes  Smokeless Tobacco Current User  . Types: Chew  Tobacco Comment   down to 1/4 pack a day 08/25/2019    Goals Met:  Independence with exercise equipment Exercise tolerated well No report of cardiac concerns or symptoms  Goals Unmet:  Not Applicable  Comments: Pt able to follow exercise prescription today without complaint.  Will continue to monitor for progression. Continues to smoke 1/2 pack a day   Dr. Emily Filbert is Medical Director for [place] and [facility].

## 2019-09-04 ENCOUNTER — Other Ambulatory Visit: Payer: Self-pay

## 2019-09-04 ENCOUNTER — Encounter: Payer: No Typology Code available for payment source | Admitting: *Deleted

## 2019-09-04 DIAGNOSIS — I214 Non-ST elevation (NSTEMI) myocardial infarction: Secondary | ICD-10-CM

## 2019-09-04 NOTE — Progress Notes (Signed)
Daily Session Note  Patient Details  Name: Marcus Bauer MRN: [id number] Date of Birth: Aug 16, 1981 Referring Provider:     Cardiac Rehab from 08/17/2019 in [facility] Cardiac and Pulmonary Rehab  Referring Provider  End, Harrell Gave MD      Encounter Date: 09/04/2019  Check In: Session Check In - 09/04/19 1021      Check-In   Supervising physician immediately available to respond to emergencies  See telemetry face sheet for immediately available ER MD    Location  [facility]-Cardiac & Pulmonary Rehab    Staff Present  Earlean Shawl, BS, ACSM CEP, Exercise Physiologist;Joseph [place] [employer];Heath Lark, RN, BSN, CCRP    Virtual Visit  No    Medication changes reported      No    Fall or balance concerns reported     No    Warm-up and Cool-down  Performed on first and last piece of equipment    Resistance Training Performed  Yes    VAD Patient?  No    PAD/SET Patient?  No      Pain Assessment   Currently in Pain?  No/denies          Social History   Tobacco Use  Smoking Status Current Every Day Smoker  . Packs/day: 0.50  . Years: 18.00  . Pack years: 9.00  . Types: Cigarettes  Smokeless Tobacco Current User  . Types: Chew  Tobacco Comment   down to 1/4 pack a day 08/25/2019    Goals Met:  Independence with exercise equipment Exercise tolerated well No report of cardiac concerns or symptoms Strength training completed today  Goals Unmet:  Not Applicable  Comments: Pt able to follow exercise prescription today without complaint.  Will continue to monitor for progression.    Dr. Emily Filbert is Medical Director for [place] and [facility].

## 2019-09-06 ENCOUNTER — Encounter: Payer: No Typology Code available for payment source | Admitting: *Deleted

## 2019-09-06 ENCOUNTER — Other Ambulatory Visit: Payer: Self-pay

## 2019-09-06 DIAGNOSIS — I214 Non-ST elevation (NSTEMI) myocardial infarction: Secondary | ICD-10-CM | POA: Diagnosis not present

## 2019-09-06 NOTE — Progress Notes (Signed)
Daily Session Note  Patient Details  Name: Marcus Bauer MRN: [id number] Date of Birth: [birth year] Referring Provider:     Cardiac Rehab from 08/17/2019 in [facility] Cardiac and Pulmonary Rehab  Referring Provider  End, Harrell Gave MD      Encounter Date: 09/06/2019  Check In: Session Check In - 09/06/19 2548      Check-In   Supervising physician immediately available to respond to emergencies  See telemetry face sheet for immediately available ER MD    Location  [facility]-Cardiac & Pulmonary Rehab    Staff Present  Darel Hong, RN BSN;Jeanna Durrell BS, Exercise Physiologist;Susanne Bice, RN, BSN, CCRP    Virtual Visit  No    Medication changes reported      No    Fall or balance concerns reported     No    Warm-up and Cool-down  Performed on first and last piece of equipment    Resistance Training Performed  Yes    VAD Patient?  No    PAD/SET Patient?  No      Pain Assessment   Currently in Pain?  No/denies          Social History   Tobacco Use  Smoking Status Current Every Day Smoker  . Packs/day: 0.50  . Years: 18.00  . Pack years: 9.00  . Types: Cigarettes  Smokeless Tobacco Current User  . Types: Chew  Tobacco Comment   down to 1/4 pack a day 08/25/2019    Goals Met:  Independence with exercise equipment Exercise tolerated well No report of cardiac concerns or symptoms Strength training completed today  Goals Unmet:  Not Applicable  Comments: Pt able to follow exercise prescription today without complaint.  Will continue to monitor for progression. Note to Dr End sent regarding symptoms, lack of sleep and elevated BP. Brayn has visit this Friday with Dr End. S Bice RN   Dr. Emily Filbert is Medical Director for [place] and [facility].

## 2019-09-06 NOTE — Telephone Encounter (Signed)
Forms returned from [employer]. Placed in nurse box

## 2019-09-06 NOTE — Telephone Encounter (Signed)
Placed in Dr Darnelle Bos in basket.

## 2019-09-08 ENCOUNTER — Encounter: Payer: Self-pay | Admitting: Internal Medicine

## 2019-09-08 ENCOUNTER — Ambulatory Visit (INDEPENDENT_AMBULATORY_CARE_PROVIDER_SITE_OTHER): Payer: No Typology Code available for payment source | Admitting: Internal Medicine

## 2019-09-08 ENCOUNTER — Other Ambulatory Visit: Payer: Self-pay

## 2019-09-08 VITALS — BP 165/137 | HR 75 | Ht 67.0 in | Wt 269.2 lb

## 2019-09-08 DIAGNOSIS — I214 Non-ST elevation (NSTEMI) myocardial infarction: Secondary | ICD-10-CM | POA: Diagnosis not present

## 2019-09-08 DIAGNOSIS — I10 Essential (primary) hypertension: Secondary | ICD-10-CM | POA: Diagnosis not present

## 2019-09-08 MED ORDER — AMLODIPINE BESYLATE 5 MG PO TABS: 5.0000 mg | ORAL_TABLET | Freq: Every day | ORAL | 3 refills | Status: DC

## 2019-09-08 MED ORDER — NITROGLYCERIN 0.4 MG SL SUBL: 0.4000 mg | SUBLINGUAL_TABLET | SUBLINGUAL | 2 refills | Status: DC | PRN

## 2019-09-08 NOTE — Telephone Encounter (Signed)
Received both forms completed by physician, sent to [place] via interoffice.

## 2019-09-08 NOTE — Progress Notes (Signed)
Follow-up Outpatient Visit Date: 09/08/2019  Primary Care Provider: Dion Body, [place] [facility] [place] [address]  Chief Complaint: Chest pain  HPI:  Mr. Bankson is a 38 y.o. male with history of coronary artery disease status post NSTEMI (07/2019), hypertension, OSA, GERD, asthma, and tobacco abuse, who presents for follow-up of artery disease.  He was admitted at [facility] on 08/12/2019 with acute left-sided chest pain rating to the left arm.  EKG showed subtle inferior ST elevation that did not meet STEMI criteria.  However, given continued chest pain refractory to medical therapy, he was taken for emergent cardiac catheterization.  This revealed occlusion of distal RPDA (too small for intervention) and 60% ostial stenosis of OM1.  LVEF was 50-55% with moderately increased LVEDP.  Medical therapy was recommended.  Subsequent echo showed normal LVEF (60-65%).  Today, Mr. Schreifels reports that he has been feeling fairly well.  He has experienced 2 episodes of chest tightness, both at rest.  He did not use sublingual nitroglycerin and actually reports that he is lost the prescription he was provided at the time of discharge.  His blood pressure has been labile and frequently elevated, which prompted his PCP to recently increase losartan.  Predominantly elevated blood pressures have also been noted at cardiac rehab.  He has been asymptomatic at cardiac rehab but does feel a little short of breath when walking on uneven surfaces.  He denies palpitations, lightheadedness, and orthopnea.  He remains compliant with his medications, though he notes that he did not take any this morning.  He has been trying to adhere to a low-sodium diet.  He has not experienced any significant bleeding, remaining on aspirin and clopidogrel.  --------------------------------------------------------------------------------------------------  Past Medical History:  Diagnosis Date  .  Anxiety    NO MEDS  . Asthma    WELL CONTROLLED  . GERD (gastroesophageal reflux disease)    OCC TUMS  . Hypertension   . Sleep apnea    WILL OCC USE NOT EVERY DAY   Past Surgical History:  Procedure Laterality Date  . CARPOMETACARPAL ([place]) FUSION OF THUMB Left 12/20/2018   Procedure: REPAIR OF THE ULNAR COLLATERAL LIGAMENT OF THUMB;  Surgeon: Corky Mull, MD;  Location: [facility] ORS;  Service: Orthopedics;  Laterality: Left;  ARTHREX SMALL JOINT INTERNAL BRACE KIT  . LEFT HEART CATH AND CORONARY ANGIOGRAPHY N/A 08/13/2019   Procedure: LEFT HEART CATH AND CORONARY ANGIOGRAPHY;  Surgeon: Nelva Bush, MD;  Location: [place] CV LAB;  Service: Cardiovascular;  Laterality: N/A;  . SHOULDER SURGERY Left      Recent CV Pertinent Labs: Lab Results  Component Value Date   CHOL 139 08/13/2019   HDL 42 08/13/2019   LDLCALC 73 08/13/2019   TRIG 118 08/13/2019   CHOLHDL 3.3 08/13/2019   INR 1.0 08/13/2019   K 4.1 08/15/2019   MG 2.1 08/15/2019   BUN 12 08/15/2019   CREATININE 1.19 08/15/2019    Past medical and surgical history were reviewed and updated in EPIC.  Current Meds  Medication Sig  . albuterol (PROVENTIL HFA;VENTOLIN HFA) 108 (90 Base) MCG/ACT inhaler Inhale 2 puffs into the lungs every 6 (six) hours as needed for wheezing or shortness of breath.  Marland Kitchen aspirin EC 81 MG EC tablet Take 1 tablet (81 mg total) by mouth daily.  Marland Kitchen atorvastatin (LIPITOR) 80 MG tablet Take 1 tablet (80 mg total) by mouth daily at 6 PM.  . calcium carbonate (TUMS - DOSED IN  MG ELEMENTAL CALCIUM) 500 MG chewable tablet Chew 1 tablet by mouth as needed for indigestion or heartburn.  . carvedilol (COREG) 25 MG tablet Take 1 tablet (25 mg total) by mouth 2 (two) times daily.  . clopidogrel (PLAVIX) 75 MG tablet Take 1 tablet (75 mg total) by mouth daily with breakfast.  . escitalopram (LEXAPRO) 10 MG tablet Take 10 mg by mouth daily.  Marland Kitchen losartan (COZAAR) 50 MG tablet Take 2 tablets (100 mg total)  by mouth daily.  . nitroGLYCERIN (NITROSTAT) 0.4 MG SL tablet Place 1 tablet (0.4 mg total) under the tongue every 5 (five) minutes x 3 doses as needed for chest pain.  . [DISCONTINUED] nitroGLYCERIN (NITROSTAT) 0.4 MG SL tablet Place 1 tablet (0.4 mg total) under the tongue every 5 (five) minutes x 3 doses as needed for chest pain.    Allergies: Shellfish allergy, Hydrochlorothiazide, and Other  Social History   Tobacco Use  . Smoking status: Current Every Day Smoker    Packs/day: 0.50    Years: 18.00    Pack years: 9.00    Types: Cigarettes  . Smokeless tobacco: Current User    Types: Chew  . Tobacco comment: down to 1/4 pack a day 08/25/2019  Substance Use Topics  . Alcohol use: Yes    Comment: OCC-weekends, more than 6/day  . Drug use: Never    History reviewed. No pertinent family history.  Review of Systems: A 12-system review of systems was performed and was negative except as noted in the HPI.  --------------------------------------------------------------------------------------------------  Physical Exam: BP (!) 165/137 (BP Location: Right Arm, Patient Position: Sitting, Cuff Size: Large)   Pulse 75   Ht 5' 7"  (1.702 m)   Wt 269 lb 4 oz (122.1 kg)   SpO2 98%   BMI 42.17 kg/m   Repeat blood pressure: 170/105.  General: NAD. HEENT: No conjunctival pallor or scleral icterus. Facemask in place. Neck: Supple without lymphadenopathy, thyromegaly, JVD, or HJR. Lungs: Normal work of breathing. Clear to auscultation bilaterally without wheezes or crackles. Heart: Regular rate and rhythm without murmurs, rubs, or gallops. Non-displaced PMI. Abd: Bowel sounds present. Soft, NT/ND without hepatosplenomegaly Ext: No lower extremity edema. Radial, PT, and DP pulses are 2+ bilaterally.  Right radial arteriotomy site is well-healed. Skin: Warm and dry without rash.  EKG: Normal sinus rhythm with left axis deviation and inferior MI.  Compared with prior tracing from  08/13/2019, today's EKG shows evolution of inferior MI.  Lab Results  Component Value Date   WBC 9.0 08/15/2019   HGB 13.6 08/15/2019   HCT 41.6 08/15/2019   MCV 85.6 08/15/2019   PLT 159 08/15/2019    Lab Results  Component Value Date   NA 139 08/15/2019   K 4.1 08/15/2019   CL 106 08/15/2019   CO2 26 08/15/2019   BUN 12 08/15/2019   CREATININE 1.19 08/15/2019   GLUCOSE 106 (H) 08/15/2019    Lab Results  Component Value Date   CHOL 139 08/13/2019   HDL 42 08/13/2019   LDLCALC 73 08/13/2019   TRIG 118 08/13/2019   CHOLHDL 3.3 08/13/2019    --------------------------------------------------------------------------------------------------  ASSESSMENT AND PLAN: NSTEMI: Mr. Norgaard had recent NSTEMI with occlusion of mid to distal RPDA, which is not amenable to PCI.  He has had 2 episodes of transient chest tightness at rest but no significant symptoms with activity.  I favor continued medical management.  We plan to continue aspirin and clopidogrel for at least 12 months as well as  remain on high intensity statin therapy.  I will add amlodipine 5 mg daily for blood pressure control and antianginal therapy.  Carvedilol 25 mg twice daily should be continued.  I have also provided Mr. Coppolino with a new prescription for nitroglycerin.  He should continue working with cardiac rehab.  I would like to see if we can get his blood pressure and intermittent chest pain under better control over the next 2 weeks before he returns to work.  Hypertension: Blood pressure poorly controlled today in spite of recent escalation of losartan by Dr. Netty Starring.  We will continue current doses of carvedilol and losartan; I will add amlodipine 5 mg daily.  Blood pressure remains elevated, further escalation of amlodipine and or addition of a thiazide diuretic will need to be considered.  Importance of sodium restriction was reinforced.  Morbid obesity: Mr. Eckrich has a BMI greater than 40 with multiple  comorbidities (hypertension and CAD).  I have encouraged him to keep working with cardiac rehab and to lose weight through diet and exercise.  Follow-up: Return to clinic in 2 weeks with APP.  Nelva Bush, MD 09/09/2019 5:04 PM

## 2019-09-08 NOTE — Patient Instructions (Signed)
Medication Instructions:  Your physician has recommended you make the following change in your medication:  1. START Amlodipine 5 mg once daily   *If you need a refill on your cardiac medications before your next appointment, please call your pharmacy*  Lab Work: None  If you have labs (blood work) drawn today and your tests are completely normal, you will receive your results only by: Marland Kitchen MyChart Message (if you have MyChart) OR . A paper copy in the mail If you have any lab test that is abnormal or we need to change your treatment, we will call you to review the results.  Testing/Procedures: None  Follow-Up: At [facility], you and your health needs are our priority.  As part of our continuing mission to provide you with exceptional heart care, we have created designated Provider Care Teams.  These Care Teams include your primary Cardiologist (physician) and Advanced Practice Providers (APPs -  Physician Assistants and Nurse Practitioners) who all work together to provide you with the care you need, when you need it.  Your next appointment:   2 week(s)  The format for your next appointment:   In Person  Provider:   Murray Hodgkins, NP or Christell Faith, PA-C    How to Take Your Blood Pressure You can take your blood pressure at home with a machine. You may need to check your blood pressure at home:  To check if you have high blood pressure (hypertension).  To check your blood pressure over time.  To make sure your blood pressure medicine is working. Supplies needed: You will need a blood pressure machine, or monitor. You can buy one at a drugstore or online. When choosing one:  Choose one with an arm cuff.  Choose one that wraps around your upper arm. Only one finger should fit between your arm and the cuff.  Do not choose one that measures your blood pressure from your wrist or finger. Your doctor can suggest a monitor. How to prepare Avoid these things for 30  minutes before checking your blood pressure:  Drinking caffeine.  Drinking alcohol.  Eating.  Smoking.  Exercising. Five minutes before checking your blood pressure:  Pee.  Sit in a dining chair. Avoid sitting in a soft couch or armchair.  Be quiet. Do not talk. How to take your blood pressure Follow the instructions that came with your machine. If you have a digital blood pressure monitor, these may be the instructions: 1. Sit up straight. 2. Place your feet on the floor. Do not cross your ankles or legs. 3. Rest your left arm at the level of your heart. You may rest it on a table, desk, or chair. 4. Pull up your shirt sleeve. 5. Wrap the blood pressure cuff around the upper part of your left arm. The cuff should be 1 inch (2.5 cm) above your elbow. It is best to wrap the cuff around bare skin. 6. Fit the cuff snugly around your arm. You should be able to place only one finger between the cuff and your arm. 7. Put the cord inside the groove of your elbow. 8. Press the power button. 9. Sit quietly while the cuff fills with air and loses air. 10. Write down the numbers on the screen. 11. Wait 2-3 minutes and then repeat steps 1-10. What do the numbers mean? Two numbers make up your blood pressure. The first number is called systolic pressure. The second is called diastolic pressure. An example of a blood  pressure reading is "120 over 80" (or 120/80). If you are an adult and do not have a medical condition, use this guide to find out if your blood pressure is normal: Normal  First number: below 120.  Second number: below 80. Elevated  First number: 120-129.  Second number: below 80. Hypertension stage 1  First number: 130-139.  Second number: 80-89. Hypertension stage 2  First number: 140 or above.  Second number: 90 or above. Your blood pressure is above normal even if only the top or bottom number is above normal. Follow these instructions at home:  Check your  blood pressure as often as your doctor tells you to.  Take your monitor to your next doctor's appointment. Your doctor will: ? Make sure you are using it correctly. ? Make sure it is working right.  Make sure you understand what your blood pressure numbers should be.  Tell your doctor if your medicines are causing side effects. Contact a doctor if:  Your blood pressure keeps being high. Get help right away if:  Your first blood pressure number is higher than 180.  Your second blood pressure number is higher than 120. This information is not intended to replace advice given to you by your health care provider. Make sure you discuss any questions you have with your health care provider. Document Released: 09/17/2008 Document Revised: 09/17/2017 Document Reviewed: 03/13/2016 Elsevier Patient Education  2020 [employer].

## 2019-09-09 ENCOUNTER — Encounter: Payer: Self-pay | Admitting: Internal Medicine

## 2019-09-09 DIAGNOSIS — I10 Essential (primary) hypertension: Secondary | ICD-10-CM | POA: Insufficient documentation

## 2019-09-11 ENCOUNTER — Encounter: Payer: No Typology Code available for payment source | Admitting: *Deleted

## 2019-09-11 ENCOUNTER — Other Ambulatory Visit: Payer: Self-pay

## 2019-09-11 DIAGNOSIS — I214 Non-ST elevation (NSTEMI) myocardial infarction: Secondary | ICD-10-CM

## 2019-09-11 NOTE — Progress Notes (Signed)
Daily Session Note  Patient Details  Name: Marcus Bauer MRN: [id number] Date of Birth: Jan 10, 1981 Referring Provider:     Cardiac Rehab from 08/17/2019 in [facility] Cardiac [facility] Re[facility]  Referring Provider  End, Harrell Gave MD      Encounter Date: 09/11/2019  Check In: Session Check In - 09/11/19 0921      Check-In   Supervising physician immediately available to respond to emergencies  See telemetry face sheet for immediately available ER MD    Location  [facility]-Cardiac & Pulmonary Rehab    Staff Present  Heath Lark, RN, BSN, CCRP;Kelly Amedeo Plenty, BS, ACSM CEP, Exercise Physiologist;Jessica [place], [place], RCEP, CCRP, CCET    Virtual Visit  No    Medication changes reported      Yes    Comments  Amlodipine added      Has not picked up yet    Fall or balance concerns reported     No    Warm-up and Cool-down  Performed on first and last piece of equipment    Resistance Training Performed  Yes    VAD Patient?  No    PAD/SET Patient?  No      Pain Assessment   Currently in Pain?  No/denies          Social History   Tobacco Use  Smoking Status Current Every Day Smoker  . Packs/day: 0.50  . Years: 18.00  . Pack years: 9.00  . Types: Cigarettes  Smokeless Tobacco Current User  . Types: Chew  Tobacco Comment   down to 1/4 pack a day 08/25/2019    Goals Met:  Independence with exercise equipment Exercise tolerated well No report of cardiac concerns or symptoms  Goals Unmet:  Not Applicable  Comments: Pt able to follow exercise prescription today without complaint.  Will continue to monitor for progression.  New Med prescribed. Will pick up today  Dr. Emily Filbert is Medical Director for [facility].

## 2019-09-13 ENCOUNTER — Other Ambulatory Visit: Payer: Self-pay

## 2019-09-13 ENCOUNTER — Encounter: Payer: No Typology Code available for payment source | Admitting: *Deleted

## 2019-09-13 DIAGNOSIS — I214 Non-ST elevation (NSTEMI) myocardial infarction: Secondary | ICD-10-CM | POA: Diagnosis not present

## 2019-09-13 NOTE — Progress Notes (Signed)
Daily Session Note  Patient Details  Name: Marcus Bauer MRN: [id number] Date of Birth: 04/23/1981 Referring Provider:     Cardiac Rehab from 08/17/2019 in [facility] Cardiac and Pulmonary Rehab  Referring Provider  End, Christopher MD      Encounter Date: 09/13/2019  Check In: Session Check In - 09/13/19 0927      Check-In   Supervising physician immediately available to respond to emergencies  See telemetry face sheet for immediately available ER MD    Location  [facility]-Cardiac & Pulmonary Rehab    Staff Present   , RN, BSN, CCRP;Jeanna [place]rell BS, Exercise Physiologist;Amanda Sommer, BA, ACSM CEP, Exercise Physiologist    Virtual Visit  No    Medication changes reported      No    Fall or balance concerns reported     No    Warm-up and Cool-down  Performed on first and last piece of equipment    Resistance Training Performed  Yes    VAD Patient?  No    PAD/SET Patient?  No      Pain Assessment   Currently in Pain?  No/denies          Social History   Tobacco Use  Smoking Status Current Every Day Smoker  . Packs/day: 0.50  . Years: 18.00  . Pack years: 9.00  . Types: Cigarettes  Smokeless Tobacco Current User  . Types: Chew  Tobacco Comment   down to 1/4 pack a day 08/25/2019    Goals Met:  Independence with exercise equipment Exercise tolerated well No report of cardiac concerns or symptoms  Goals Unmet:  Not Applicable  Comments: Pt able to follow exercise prescription today without complaint.  Will continue to monitor for progression. BP med was not available for pickup. Has not started new med yet. smoking about a pack a day  Dr. Mark Miller is Medical Director for [facility] and [facility]. 

## 2019-09-18 ENCOUNTER — Encounter: Payer: No Typology Code available for payment source | Admitting: *Deleted

## 2019-09-18 ENCOUNTER — Other Ambulatory Visit: Payer: Self-pay

## 2019-09-18 DIAGNOSIS — I214 Non-ST elevation (NSTEMI) myocardial infarction: Secondary | ICD-10-CM

## 2019-09-18 NOTE — Progress Notes (Signed)
Daily Session Note  Patient Details  Name: Marcus Bauer MRN: [id number] Date of Birth: [birth year] Referring Provider:     Cardiac Rehab from 08/17/2019 in [facility]  Referring Provider  End, Harrell Gave MD      Encounter Date: 09/18/2019  Check In: Session Check In - 09/18/19 0921      Check-In   Supervising physician immediately available to respond to emergencies  See telemetry face sheet for immediately available ER MD    Location  [facility]-Cardiac & Pulmonary Rehab    Staff Present  Heath Lark, RN, BSN, CCRP;Jessica Piedra Aguza, MA, RCEP, CCRP, [place], BS, ACSM CEP, Exercise Physiologist    Virtual Visit  No    Medication changes reported      No    Fall or balance concerns reported     No    Resistance Training Performed  Yes    VAD Patient?  No    PAD/SET Patient?  No      Pain Assessment   Currently in Pain?  No/denies          Social History   Tobacco Use  Smoking Status Current Every Day Smoker  . Packs/day: 0.50  . Years: 18.00  . Pack years: 9.00  . Types: Cigarettes  Smokeless Tobacco Current User  . Types: Chew  Tobacco Comment   down to 1/4 pack a day 08/25/2019    Goals Met:  Independence with exercise equipment Exercise tolerated well No report of cardiac concerns or symptoms  Goals Unmet:  Not Applicable  Comments: Pt able to follow exercise prescription today without complaint.  Will continue to monitor for progression.    Dr. Emily Filbert is Medical Director for [place] [facility].

## 2019-09-19 NOTE — Telephone Encounter (Signed)
Received forms form [employer] for physician to complete, placed in nurse's box

## 2019-09-20 ENCOUNTER — Telehealth: Payer: Self-pay | Admitting: *Deleted

## 2019-09-20 ENCOUNTER — Encounter: Payer: Self-pay | Admitting: *Deleted

## 2019-09-20 DIAGNOSIS — I214 Non-ST elevation (NSTEMI) myocardial infarction: Secondary | ICD-10-CM

## 2019-09-20 NOTE — Telephone Encounter (Signed)
Forms completed by Dr ENd and given to [facility] for next steps.

## 2019-09-20 NOTE — Telephone Encounter (Signed)
Received completed forms from physician, sent to [employer] via interoffice 

## 2019-09-20 NOTE — Progress Notes (Signed)
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 explanation and review of dietary fats and fiber.   Controlling Sodium/Reading Food Labels: -Group verbal and written material supporting the discussion of sodium use in heart healthy nutrition. Review and explanation with models, verbal and written materials for utilization of the food label.   Exercise Physiology & General Exercise Guidelines: - Group verbal and written instruction with models to review the exercise physiology of the cardiovascular system and associated critical values. Provides general exercise guidelines with specific guidelines to those with heart or lung disease.    Aerobic Exercise & Resistance Training: - Gives group verbal and written instruction on the various components of exercise. Focuses on aerobic and resistive training programs and the benefits of this training  and how to safely progress through these programs..   Flexibility, Balance, Mind/Body Relaxation: Provides group verbal/written instruction on the benefits of flexibility and balance training, including mind/body exercise modes such as yoga, pilates and tai chi.  Demonstration and skill practice provided.   Stress and Anxiety: - Provides group verbal and written instruction about the health risks of elevated stress and causes of high stress.  Discuss the correlation between heart/lung disease and anxiety and treatment options. Review healthy ways to manage with stress and anxiety.   Depression: - Provides group verbal and written instruction on the correlation between heart/lung disease and depressed mood, treatment options, and the stigmas associated with seeking treatment.   Anatomy & Physiology of the Heart: - Group verbal and written instruction and models provide basic cardiac anatomy and physiology, with the coronary electrical and arterial systems. Review of Valvular disease and Heart Failure   Cardiac Procedures: - Group verbal and written instruction to review commonly prescribed medications for heart disease. Reviews the medication, class of the drug, and side effects. Includes the steps to properly store meds and maintain the prescription regimen. (beta blockers and nitrates)   Cardiac Medications I: - Group verbal and written instruction to review commonly prescribed medications for heart disease. Reviews the medication, class of the drug, and side effects. Includes the steps to properly store meds and maintain the prescription regimen.   Cardiac Medications II: -Group verbal and written instruction to review commonly prescribed medications for  heart disease. Reviews the medication, class of the drug, and side effects. (all other drug classes)    Go Sex-Intimacy & Heart Disease, Get SMART - Goal Setting: - Group verbal and written instruction through game format to  discuss heart disease and the return to sexual intimacy. Provides group verbal and written material to discuss and apply goal setting through the application of the S.M.A.R.T. Method.   Other Matters of the Heart: - Provides group verbal, written materials and models to describe Stable Angina and Peripheral Artery. Includes description of the disease process and treatment options available to the cardiac patient.   Exercise & Equipment Safety: - Individual verbal instruction and demonstration of equipment use and safety with use of the equipment.   Cardiac Rehab from 08/17/2019 in [facility] Cardiac and Pulmonary Rehab  Date  08/17/19  Educator  [facility]  Instruction Review Code  1- Verbalizes Understanding      Infection Prevention: - Provides verbal and written material to individual with discussion of infection control including proper hand washing and proper equipment cleaning during exercise session.   Cardiac Rehab from 08/17/2019 in [facility] Cardiac and Pulmonary Rehab  Date  08/17/19  Educator  [facility]  Instruction Review Code  1- Verbalizes Understanding      Falls Prevention: - Provides verbal and written material to individual with discussion of falls prevention and safety.   Cardiac Rehab from 08/17/2019 in [facility] Cardiac and Pulmonary Rehab  Date  08/17/19  Educator  [facility]  Instruction Review Code  1- Verbalizes Understanding      Diabetes: - Individual verbal and written instruction to review signs/symptoms of diabetes, desired ranges of glucose level fasting, after meals and with exercise. Acknowledge that pre and post exercise glucose checks will be done for 3 sessions at entry of program.   Know Your Numbers and Risk Factors: -Group verbal and written instruction about important numbers in your health.  Discussion of what are risk factors and how they play a role in the disease process.  Review of Cholesterol, Blood Pressure, Diabetes, and BMI and the role they play in your overall  health.   Sleep Hygiene: -Provides group verbal and written instruction about how sleep can affect your health.  Define sleep hygiene, discuss sleep cycles and impact of sleep habits. Review good sleep hygiene tips.    Other: -Provides group and verbal instruction on various topics (see comments)   Knowledge Questionnaire Score: Knowledge Questionnaire Score - 08/17/19 1348      Knowledge Questionnaire Score   Pre Score  20/26 Education Focus: A&P, Angina, Nurtition, Exercise       Core Components/Risk Factors/Patient Goals at Admission: Personal Goals and Risk Factors at Admission - 08/17/19 1349      Core Components/Risk Factors/Patient Goals on Admission    Weight Management  Yes;Weight Loss;Obesity    Intervention  Weight Management: Provide education and appropriate resources to help participant work on and attain dietary goals.;Weight Management: Develop a combined nutrition and exercise program designed to reach desired caloric intake, while maintaining appropriate intake of nutrient and fiber, sodium and fats, and appropriate energy expenditure required for the weight goal.;Weight Management/Obesity: Establish reasonable short term and long term weight goals.;Obesity: Provide education and appropriate resources to help participant work on and attain dietary goals.    Admit Weight  271 lb 4.8 oz (123.1 kg)    Goal Weight: Short Term  265 lb (120.2 kg)    Goal Weight: Long Term  260 lb (117.9 kg)  Expected Outcomes  Short Term: Continue to assess and modify interventions until short term weight is achieved;Long Term: Adherence to nutrition and physical activity/exercise program aimed toward attainment of established weight goal;Weight Loss: Understanding of general recommendations for a balanced deficit meal plan, which promotes 1-2 lb weight loss per week and includes a negative energy balance of [phone] kcal/d;Understanding recommendations for meals to include 15-35% energy as  protein, 25-35% energy from fat, 35-60% energy from carbohydrates, less than 264m of dietary cholesterol, 20-35 gm of total fiber daily;Understanding of distribution of calorie intake throughout the day with the consumption of 4-5 meals/snacks    Tobacco Cessation  Yes    Number of packs per day  1/2    Intervention  Assist the participant in steps to quit. Provide individualized education and counseling about committing to Tobacco Cessation, relapse prevention, and pharmacological support that can be provided by physician.;OAdvice worker assist with locating and accessing local/national Quit Smoking programs, and support quit date choice.    Expected Outcomes  Short Term: Will demonstrate readiness to quit, by selecting a quit date.;Short Term: Will quit all tobacco product use, adhering to prevention of relapse plan.;Long Term: Complete abstinence from all tobacco products for at least 12 months from quit date.    Hypertension  Yes    Intervention  Provide education on lifestyle modifcations including regular physical activity/exercise, weight management, moderate sodium restriction and increased consumption of fresh fruit, vegetables, and low fat dairy, alcohol moderation, and smoking cessation.;Monitor prescription use compliance.    Expected Outcomes  Short Term: Continued assessment and intervention until BP is < 140/979mHG in hypertensive participants. < 130/8025mG in hypertensive participants with diabetes, heart failure or chronic kidney disease.;Long Term: Maintenance of blood pressure at goal levels.    Lipids  Yes    Intervention  Provide education and support for participant on nutrition & aerobic/resistive exercise along with prescribed medications to achieve LDL <73m61mDL >40mg11m Expected Outcomes  Short Term: Participant states understanding of desired cholesterol values and is compliant with medications prescribed. Participant is following exercise prescription and  nutrition guidelines.;Long Term: Cholesterol controlled with medications as prescribed, with individualized exercise RX and with personalized nutrition plan. Value goals: LDL < 73mg,34m > 40 mg.       Core Components/Risk Factors/Patient Goals Review:  Goals and Risk Factor Review    Row Name 08/25/19 0915 09/01/19 0917           Core Components/Risk Factors/Patient Goals Review   Personal Goals Review  Hypertension  Weight Management/Obesity;Hypertension;Tobacco Cessation      Review  AdrianRowynad some medication changes for his blood pressure.  He was seen in the office on Wed for high BP.  He will be getting a bp cuff for home use to start tracking at home.  AdrianLittletonbtained a blood pressure cuff for home use. He is using his checking his blood pressure around lunch time or if he is feeling bad. He has been getting reading of 140's/80's. Patiennt is intersted in quitting smoking. His smoking habits are varaiable at the moment. He is chewing regular gum, staying busy to try to quit and exercising. Informed him of ways to quit smoking and ways to help him cope with is urges.      Expected Outcomes  Short: Get BP cuff to start tracking at home.  Long: Continue monitor BP closely.  Short: stay below a half pack of cigarettes a day. Long:  quit smoking.         Core Components/Risk Factors/Patient Goals at Discharge (Final Review):  Goals and Risk Factor Review - 09/01/19 0917      Core Components/Risk Factors/Patient Goals Review   Personal Goals Review  Weight Management/Obesity;Hypertension;Tobacco Cessation    Review  Marcus Bauer has obtained a blood pressure cuff for home use. He is using his checking his blood pressure around lunch time or if he is feeling bad. He has been getting reading of 140's/80's. Patiennt is intersted in quitting smoking. His smoking habits are varaiable at the moment. He is chewing regular gum, staying busy to try to quit and exercising. Informed him of ways to  quit smoking and ways to help him cope with is urges.    Expected Outcomes  Short: stay below a half pack of cigarettes a day. Long: quit smoking.       ITP Comments: ITP Comments    Row Name 08/16/19 1057 08/17/19 1342 08/18/19 0916 08/21/19 0855 08/23/19 0718   ITP Comments  Completed virtual orientation.  Documentation for diagnosis can be found in [facility] encounter 08/12/19.  EP eval scheduled for 11am tomorrow.  Completed 6MWT and gym orientation.  Initial ITP created and sent for review to Dr. Emily Filbert, Medical Director.  First full day of exercise!  Patient was oriented to gym and equipment including functions, settings, policies, and procedures.  Patient's individual exercise prescription and treatment plan were reviewed.  All starting workloads were established based on the results of the 6 minute walk test done at initial orientation visit.  The plan for exercise progression was also introduced and progression will be customized based on patient's performance and goals.  -  30 day review completed. Continue with ITP sent to Dr. Emily Filbert, Medical Director of Cardiac and Pulmonary Rehab for review , changes as needed and signature.   New to program   Row Name 08/25/19 0913 08/30/19 1021 08/31/19 1336 09/06/19 0924 09/06/19 1048   ITP Comments  Tobacco  down to 1/4 pack a day last report was 1/2 pack on 10/29  BP up on arrival.  Had not taken meds today. Planned to go home take meds and recheck BP after 30-45 min.  To call MD if BP remains elevated  Initial RD eval completed  BP remains elevated especially diastolic and he states he is not sleeping well at night. Summary of BP's at sessions given to address both issues with MD.  Note to Dr End sent regarding symptoms, lack of sleep and elevated BP. Marcus Bauer has visit this Friday with Dr End. [place] Name 09/11/19 1008 09/13/19 0928 09/20/19 1103       ITP Comments  New Med prescribed. Will pick up today  BP med was not available for pickup. Has  not started new med yet.  30 day review competed . ITP sent to Dr Emily Filbert for review, changes as needed and ITP approval signature.        Comments:

## 2019-09-20 NOTE — Telephone Encounter (Signed)
[employer] forms placed in Dr Darnelle Bos box for him for completion.

## 2019-09-22 ENCOUNTER — Encounter: Payer: No Typology Code available for payment source | Attending: Internal Medicine

## 2019-09-22 ENCOUNTER — Encounter: Payer: Self-pay | Admitting: Nurse Practitioner

## 2019-09-22 ENCOUNTER — Ambulatory Visit: Payer: No Typology Code available for payment source | Admitting: Nurse Practitioner

## 2019-09-22 DIAGNOSIS — F419 Anxiety disorder, unspecified: Secondary | ICD-10-CM | POA: Insufficient documentation

## 2019-09-22 DIAGNOSIS — F17210 Nicotine dependence, cigarettes, uncomplicated: Secondary | ICD-10-CM | POA: Insufficient documentation

## 2019-09-22 DIAGNOSIS — I10 Essential (primary) hypertension: Secondary | ICD-10-CM | POA: Insufficient documentation

## 2019-09-22 DIAGNOSIS — Z7982 Long term (current) use of aspirin: Secondary | ICD-10-CM | POA: Insufficient documentation

## 2019-09-22 DIAGNOSIS — Z7902 Long term (current) use of antithrombotics/antiplatelets: Secondary | ICD-10-CM | POA: Insufficient documentation

## 2019-09-22 DIAGNOSIS — I214 Non-ST elevation (NSTEMI) myocardial infarction: Secondary | ICD-10-CM | POA: Insufficient documentation

## 2019-09-22 DIAGNOSIS — Z79899 Other long term (current) drug therapy: Secondary | ICD-10-CM | POA: Insufficient documentation

## 2019-09-22 DIAGNOSIS — Z7901 Long term (current) use of anticoagulants: Secondary | ICD-10-CM | POA: Insufficient documentation

## 2019-09-22 DIAGNOSIS — K219 Gastro-esophageal reflux disease without esophagitis: Secondary | ICD-10-CM | POA: Insufficient documentation

## 2019-09-22 NOTE — Progress Notes (Deleted)
Office Visit    Patient Name: Marcus Bauer Date of Encounter: 09/22/2019  Primary Care Provider:  Dion Body, MD Primary Cardiologist:  No primary care provider on file.  Chief Complaint    ***  Past Medical History    Past Medical History:  Diagnosis Date  . Anxiety    NO MEDS  . Asthma    WELL CONTROLLED  . GERD (gastroesophageal reflux disease)    OCC TUMS  . Hypertension   . Sleep apnea    WILL OCC USE NOT EVERY DAY   Past Surgical History:  Procedure Laterality Date  . CARPOMETACARPAL ([place]) FUSION OF THUMB Left 12/20/2018   Procedure: REPAIR OF THE ULNAR COLLATERAL LIGAMENT OF THUMB;  Surgeon: Corky Mull, MD;  Location: [facility] ORS;  Service: Orthopedics;  Laterality: Left;  AR[facility] SMALL JOINT INTERNAL BRACE KIT  . LEFT HEART CATH AND CORONARY ANGIOGRAPHY N/A 08/13/2019   Procedure: LEFT HEART CATH AND CORONARY ANGIOGRAPHY;  Surgeon: Nelva Bush, MD;  Location: [place] [facility];  Service: Cardiovascular;  Laterality: N/A;  . SHOULDER SURGERY Left     Allergies  Allergies  Allergen Reactions  . Shellfish Allergy Shortness Of Breath  . Hydrochlorothiazide Other (See Comments)    Intolerance - malaise  . Other Hives    CLOROX    History of Present Illness    ***  Home Medications    Prior to Admission medications   Medication Sig Start Date End Date Taking? Authorizing Provider  albuterol (PROVENTIL HFA;VENTOLIN HFA) 108 (90 Base) MCG/ACT inhaler Inhale 2 puffs into the lungs every 6 (six) hours as needed for wheezing or shortness of breath.    [provider]  amLODipine (NORVASC) 5 MG tablet Take 1 tablet (5 mg total) by mouth daily. 09/08/19 12/07/19  End, Harrell Gave, MD  aspirin EC 81 MG EC tablet Take 1 tablet (81 mg total) by mouth daily. 08/16/19   Dustin Flock, MD  atorvastatin (LIPITOR) 80 MG tablet Take 1 tablet (80 mg total) by mouth daily at 6 PM. 08/15/19   Dustin Flock, MD  calcium carbonate (TUMS - DOSED  IN MG ELEMENTAL CALCIUM) 500 MG chewable tablet Chew 1 tablet by mouth as needed for indigestion or heartburn.    [provider]  carvedilol (COREG) 25 MG tablet Take 1 tablet (25 mg total) by mouth 2 (two) times daily. 08/15/19 08/14/20  Dustin Flock, MD  clopidogrel (PLAVIX) 75 MG tablet Take 1 tablet (75 mg total) by mouth daily with breakfast. 08/16/19   Dustin Flock, MD  escitalopram (LEXAPRO) 10 MG tablet Take 10 mg by mouth daily. 08/23/19   [provider]  losartan (COZAAR) 50 MG tablet Take 2 tablets (100 mg total) by mouth daily. 08/15/19 08/14/20  Dustin Flock, MD  nitroGLYCERIN (NITROSTAT) 0.4 MG SL tablet Place 1 tablet (0.4 mg total) under the tongue every 5 (five) minutes x 3 doses as needed for chest pain. 09/08/19   Nelva Bush, MD    Review of Systems    ***.  All other systems reviewed and are otherwise negative except as noted above.  Physical Exam    VS:  There were no vitals taken for this visit. , BMI There is no height or weight on file to calculate BMI. GEN: Well nourished, well developed, in no acute distress. HEENT: normal. Neck: Supple, no JVD, carotid bruits, or masses. Cardiac: RRR, no murmurs, rubs, or gallops. No clubbing, cyanosis, edema.  Radials/DP/PT 2+ and equal bilaterally.  Respiratory:  Respirations regular and unlabored, clear to auscultation bilaterally. GI: Soft, nontender, nondistended, BS + x 4. MS: no deformity or atrophy. Skin: warm and dry, no rash. Neuro:  Strength and sensation are intact. Psych: Normal affect.  Accessory Clinical Findings    ECG personally reviewed by me today - *** - no acute changes.  Assessment & Plan    1.  ***   Arvil Chaco, PA-C 09/22/2019, 2:18 PM

## 2019-09-22 NOTE — Progress Notes (Deleted)
Office Visit    Patient Name: Marcus Bauer Date of Encounter: 09/22/2019  Primary Care Provider:  Dion Body, MD Primary Cardiologist:  Nelva Bush, MD  Chief Complaint    38 y/o ? with a history of hypertension, sleep apnea, GERD, asthma, and tobacco abuse, who presents for follow-up after recent non-STEMI.  Past Medical History    Past Medical History:  Diagnosis Date   (HFpEF) heart failure with preserved ejection fraction ([place])    a 07/2019 Echo: EF 60-65%. No signif valvular dzs; b. 07/2019 Cath: LVEDP 30-4mHg.   Anxiety    Asthma    CAD (coronary artery disease)    a. 07/2019 NSTEMI/Cath: LM nl, LAD nl, D1/2/3 nl, LCX nl, OM1 60, OM2/3 nl, RCA large, nl, RPDA 100 w/ L->R collats. EF 50-55%.   GERD (gastroesophageal reflux disease)    Hypertension    Sleep apnea    a. CPAP noncompliance.   Past Surgical History:  Procedure Laterality Date   CARPOMETACARPAL ([place] FUSION OF THUMB Left 12/20/2018   Procedure: REPAIR OF THE ULNAR COLLATERAL LIGAMENT OF THUMB;  Surgeon: PCorky Mull MD;  Location: [facility] ORS;  Service: Orthopedics;  Laterality: Left;  A[place]INT INTERNAL BRACE KIT   LEFT HEART CATH AND CORONARY ANGIOGRAPHY N/A 08/13/2019   Procedure: LEFT HEART CATH AND CORONARY ANGIOGRAPHY;  Surgeon: ENelva Bush MD;  [place] [facility];  Service: Cardiovascular;  Laterality: N/A;   SHOULDER SURGERY Left     Allergies  Allergies  Allergen Reactions   Shellfish Allergy Shortness Of Breath   Hydrochlorothiazide Other (See Comments)    Intolerance - malaise   Other Hives    CLOROX    History of Present Illness    38year old male with the above past medical history including hypertension, sleep apnea, GERD, asthma, and tobacco abuse.  [facility] pain, dyspnea, and lightheadedness with subsequent finding of high-sensitivity troponin elevation to 15,415.   In the setting of non-STEMI, he underwent echocardiogram which showed normal LV function-60-65%.  Diagnostic catheterization was performed and showed moderate nonobstructive OM1 disease with a total occlusion of the RPDA.  This was fed via left to right collaterals and was felt to be too small of a vessel for percutaneous intervention therefore, medical therapy was recommended.  Of note, his LVEDP was elevated at 30 to 35 mmHg.  He was discharged on aspirin, Plavix, beta-blocker, statin, and ARB.  Following discharge, he followed up with Dr. ESaunders Revelon November 20, at which time he was doing reasonably well.  His blood pressure was elevated amlodipine therapy was added with plan for follow-up today.  Home Medications    Prior to Admission medications   Medication Sig Start Date End Date Taking? Authorizing Provider  albuterol (PROVENTIL HFA;VENTOLIN HFA) 108 (90 Base) MCG/ACT inhaler Inhale 2 puffs into the lungs every 6 (six) hours as needed for wheezing or shortness of breath.    [provider]  amLODipine (NORVASC) 5 MG tablet Take 1 tablet (5 mg total) by mouth daily. 09/08/19 12/07/19  End, CHarrell Gave MD  aspirin EC 81 MG EC tablet Take 1 tablet (81 mg total) by mouth daily. 08/16/19   PDustin Flock MD  atorvastatin (LIPITOR) 80 MG tablet Take 1 tablet (80 mg total) by mouth daily at 6 PM. 08/15/19   PDustin Flock MD  calcium carbonate (TUMS - DOSED IN MG ELEMENTAL CALCIUM) 500 MG chewable tablet Chew 1  tablet by mouth as needed for indigestion or heartburn.    [provider]  carvedilol (COREG) 25 MG tablet Take 1 tablet (25 mg total) by mouth 2 (two) times daily. 08/15/19 08/14/20  Dustin Flock, MD  clopidogrel (PLAVIX) 75 MG tablet Take 1 tablet (75 mg total) by mouth daily with breakfast. 08/16/19   Dustin Flock, MD  escitalopram (LEXAPRO) 10 MG tablet Take 10 mg by mouth daily. 08/23/19   [provider]  losartan (COZAAR) 50 MG tablet Take 2 tablets (100  mg total) by mouth daily. 08/15/19 08/14/20  Dustin Flock, MD  nitroGLYCERIN (NITROSTAT) 0.4 MG SL tablet Place 1 tablet (0.4 mg total) under the tongue every 5 (five) minutes x 3 doses as needed for chest pain. 09/08/19   Nelva Bush, MD    Review of Systems    ***.  All other systems reviewed and are otherwise negative except as noted above.  Physical Exam    VS:  There were no vitals taken for this visit. , BMI There is no height or weight on file to calculate BMI. GEN: Well nourished, well developed, in no acute distress. HEENT: normal. Neck: Supple, no JVD, carotid bruits, or masses. Cardiac: RRR, no murmurs, rubs, or gallops. No clubbing, cyanosis, edema.  Radials/DP/PT 2+ and equal bilaterally.  Respiratory:  Respirations regular and unlabored, clear to auscultation bilaterally. GI: Soft, nontender, nondistended, BS + x 4. MS: no deformity or atrophy. Skin: warm and dry, no rash. Neuro:  Strength and sensation are intact. Psych: Normal affect.  Accessory Clinical Findings    ECG personally reviewed by me today - *** - no acute changes.  Lab Results  Component Value Date   WBC 9.0 08/15/2019   HGB 13.6 08/15/2019   HCT 41.6 08/15/2019   MCV 85.6 08/15/2019   PLT 159 08/15/2019   Lab Results  Component Value Date   CREATININE 1.19 08/15/2019   BUN 12 08/15/2019   NA 139 08/15/2019   K 4.1 08/15/2019   CL 106 08/15/2019   CO2 26 08/15/2019   No results found for: ALT, AST, GGT, ALKPHOS, BILITOT Lab Results  Component Value Date   CHOL 139 08/13/2019   HDL 42 08/13/2019   LDLCALC 73 08/13/2019   TRIG 118 08/13/2019   CHOLHDL 3.3 08/13/2019    Lab Results  Component Value Date   HGBA1C 5.6 08/12/2019    Assessment & Plan    1.  ***   Murray Hodgkins, NP 09/22/2019, 2:34 PM

## 2019-09-26 ENCOUNTER — Telehealth: Payer: Self-pay | Admitting: Internal Medicine

## 2019-09-26 NOTE — Telephone Encounter (Signed)
Recieved request from : [employer] Scanned to releases   Forwarded to [employer] for processing

## 2019-09-27 ENCOUNTER — Ambulatory Visit (INDEPENDENT_AMBULATORY_CARE_PROVIDER_SITE_OTHER): Payer: No Typology Code available for payment source | Admitting: Nurse Practitioner

## 2019-09-27 ENCOUNTER — Other Ambulatory Visit: Payer: Self-pay

## 2019-09-27 ENCOUNTER — Encounter: Payer: Self-pay | Admitting: Nurse Practitioner

## 2019-09-27 VITALS — BP 130/92 | HR 94 | Temp 96.5°F | Ht 67.0 in | Wt 267.0 lb

## 2019-09-27 DIAGNOSIS — Z720 Tobacco use: Secondary | ICD-10-CM

## 2019-09-27 DIAGNOSIS — E785 Hyperlipidemia, unspecified: Secondary | ICD-10-CM | POA: Diagnosis not present

## 2019-09-27 DIAGNOSIS — I25119 Atherosclerotic heart disease of native coronary artery with unspecified angina pectoris: Secondary | ICD-10-CM

## 2019-09-27 DIAGNOSIS — I10 Essential (primary) hypertension: Secondary | ICD-10-CM | POA: Diagnosis not present

## 2019-09-27 MED ORDER — AMLODIPINE BESYLATE 10 MG PO TABS: 10.0000 mg | ORAL_TABLET | Freq: Every day | ORAL | 3 refills | Status: DC

## 2019-09-27 NOTE — Progress Notes (Signed)
Office Visit    Patient Name: Marcus Bauer Date of Encounter: 09/27/2019  Primary Care Provider:  Dion Body, MD Primary Cardiologist:  Nelva Bush, MD  Chief Complaint    38 year old male with a history of CAD status post non-STEMI October 2020, hypertension, sleep apnea, GERD, asthma, and tobacco abuse, who presents for follow-up related to CAD.  Past Medical History    Past Medical History:  Diagnosis Date  . (HFpEF) heart failure with preserved ejection fraction ([place])    a 07/2019 Echo: EF 60-65%. No signif valvular dzs; b. 07/2019 Cath: LVEDP 30-52mHg.  .Marland KitchenAnxiety   . Asthma   . CAD (coronary artery disease)    a. 07/2019 NSTEMI/Cath: LM nl, LAD nl, D1/2/3 nl, LCX nl, OM1 60, OM2/3 nl, RCA large, nl, RPDA 100 w/ L->R collats. EF 50-55%.  .Marland KitchenGERD (gastroesophageal reflux disease)   . Hypertension   . Sleep apnea    a. CPAP noncompliance.   Past Surgical History:  Procedure Laterality Date  . CARPOMETACARPAL ([place] FUSION OF THUMB Left 12/20/2018   Procedure: REPAIR OF THE ULNAR COLLATERAL LIGAMENT OF THUMB;  Surgeon: PCorky Mull MD;  Location: [facility] ORS;  Service: Orthopedics;  Laterality: Left;  ARTHREX SMALL JOINT INTERNAL BRACE KIT  . LEFT HEART CATH AND CORONARY ANGIOGRAPHY N/A 08/13/2019   Procedure: LEFT HEART CATH AND CORONARY ANGIOGRAPHY;  Surgeon: ENelva Bush MD;  [facility];  Service: Cardiovascular;  Laterality: N/A;  . SHOULDER SURGERY Left     Allergies  Allergies  Allergen Reactions  . Shellfish Allergy Shortness Of Breath  . Hydrochlorothiazide Other (See Comments)    Intolerance - malaise  . Other Hives    CLOROX    History of Present Illness    38year old male with the above past medical history including CAD status post non-STEMI in October 2020, hypertension, sleep apnea, GERD, asthma, and tobacco abuse.  [facility]-sided chest pain rating to the left arm.   EKG showed subtle inferior ST elevation but did not meet STEMI criteria.  Given continued chest pain refractory to medical therapy, he was taken for emergent catheterization.  This revealed occlusion of the distal RPDA, which was too small for intervention, along with a 60% ostial stenosis of the OM1.  EF was 50-55% with moderately increased LVEDP.  Medical therapy was recommended.  Follow-up echo showed normal LV function with an EF of 60 to 65%.  He was last seen in clinic by Dr. ESaunders Revelon November 20, with report of 2 episodes of chest tightness at rest but with good activity tolerance.  Amlodipine therapy was added to his regimen in the setting of ongoing hypertension.  Since his last visit, he has done well.  He continues to participate in cardiac rehabilitation and tolerates this well.  He says he did have one episode of chest tightness that lasted about 20 minutes, while he was at home and sitting with his wife.  He did take a nitroglycerin and thinks that perhaps it helped, though symptoms persisted for about 10 minutes after taking it.  He has not had any exertional symptoms.  He does sometimes check his blood pressure at home and notes that diastolics are typically in the 90s to low 1559Rwith systolics in the 1416Lto 1845X  He denies palpitations, PND, orthopnea, dizziness, syncope, edema, or early satiety.  Home Medications    Prior to Admission medications  Medication Sig Start Date End Date Taking? Authorizing Provider  albuterol (PROVENTIL HFA;VENTOLIN HFA) 108 (90 Base) MCG/ACT inhaler Inhale 2 puffs into the lungs every 6 (six) hours as needed for wheezing or shortness of breath.   Yes [provider]  amLODipine (NORVASC) 5 MG tablet Take 1 tablet (5 mg total) by mouth daily. 09/08/19 12/07/19 Yes End, Harrell Gave, MD  aspirin EC 81 MG EC tablet Take 1 tablet (81 mg total) by mouth daily. 08/16/19  Yes Dustin Flock, MD  atorvastatin (LIPITOR) 80 MG tablet Take 1 tablet (80 mg  total) by mouth daily at 6 PM. 08/15/19  Yes Dustin Flock, MD  calcium carbonate (TUMS - DOSED IN MG ELEMENTAL CALCIUM) 500 MG chewable tablet Chew 1 tablet by mouth as needed for indigestion or heartburn.   Yes [provider]  carvedilol (COREG) 25 MG tablet Take 1 tablet (25 mg total) by mouth 2 (two) times daily. 08/15/19 08/14/20 Yes Dustin Flock, MD  clopidogrel (PLAVIX) 75 MG tablet Take 1 tablet (75 mg total) by mouth daily with breakfast. 08/16/19  Yes Dustin Flock, MD  escitalopram (LEXAPRO) 10 MG tablet Take 10 mg by mouth daily. 08/23/19  Yes [provider]  losartan (COZAAR) 50 MG tablet Take 2 tablets (100 mg total) by mouth daily. 08/15/19 08/14/20 Yes Dustin Flock, MD  nitroGLYCERIN (NITROSTAT) 0.4 MG SL tablet Place 1 tablet (0.4 mg total) under the tongue every 5 (five) minutes x 3 doses as needed for chest pain. 09/08/19  Yes End, Harrell Gave, MD    Review of Systems    1 episode of chest discomfort since his last visit.  Tolerating exercise well without any symptoms.  Denies dyspnea, palpitations, PND, orthopnea, dizziness, syncope, edema, or early satiety.  All other systems reviewed and are otherwise negative except as noted above.  Physical Exam    VS:  BP (!) 130/92 (BP Location: Left Arm, Patient Position: Sitting, Cuff Size: Large)   Pulse 94   Temp (!) 96.5 F (35.8 C)   Ht '5\' 7"'$  (1.702 m)   Wt 267 lb (121.1 kg)   SpO2 96%   BMI 41.82 kg/m  , BMI Body mass index is 41.82 kg/m. GEN: Well nourished, well developed, in no acute distress. HEENT: normal. Neck: Supple, no JVD, carotid bruits, or masses. Cardiac: RRR, no murmurs, rubs, or gallops. No clubbing, cyanosis, edema.  Radials/PT 2+ and equal bilaterally.  Respiratory:  Respirations regular and unlabored, clear to auscultation bilaterally. GI: Soft, nontender, nondistended, BS + x 4. MS: no deformity or atrophy. Skin: warm and dry, no rash. Neuro:  Strength and sensation are  intact. Psych: Normal affect.  Accessory Clinical Findings    ECG personally reviewed by me today -regular sinus rhythm, 94 inferior infarct - no acute changes.  Lab Results  Component Value Date   WBC 9.0 08/15/2019   HGB 13.6 08/15/2019   HCT 41.6 08/15/2019   MCV 85.6 08/15/2019   PLT 159 08/15/2019   Lab Results  Component Value Date   CREATININE 1.19 08/15/2019   BUN 12 08/15/2019   NA 139 08/15/2019   K 4.1 08/15/2019   CL 106 08/15/2019   CO2 26 08/15/2019    Lab Results  Component Value Date   CHOL 139 08/13/2019   HDL 42 08/13/2019   LDLCALC 73 08/13/2019   TRIG 118 08/13/2019   CHOLHDL 3.3 08/13/2019    Lab Results  Component Value Date   HGBA1C 5.6 08/12/2019    Assessment &  Plan    1.  Coronary artery disease: Status post non-STEMI in October of this year with finding of an occluded RPDA with left-to-right collaterals and low-normal LV function.  At his last visit, he reported intermittent chest discomfort and in the setting of hypertension, amlodipine therapy was added.  Since then, he feels as though he has done better.  He has not had any exertional symptoms but did have one episode of chest discomfort while sitting on his couch.  He did take a nitroglycerin for this and thinks that perhaps it helped.  He has otherwise been tolerating cardiac rehabilitation well.  Blood pressures at home remain elevated in the 130s to 140s over 90s to low 100s.  He remains on aspirin, statin, beta-blocker, Plavix, and ARB therapy.  Calcium channel blocker was added at his last visit and I am going to titrate his amlodipine to 10 mg daily today.  2.  Essential hypertension: Blood pressure still trending high at home.  Titrating amlodipine to 10 mg daily today.  We discussed the importance of ongoing activity with a goal of weight loss.  3.  Hyperlipidemia: LDL was 73 in October, prior to initiation of statin therapy.  I will arrange for follow-up lipids and LFTs, as he is not  fasting today.  4.  Tobacco abuse: He continues to smoke.  Complete cessation advised.  He understands that he needs to quit but has not established a quit date as of yet.  5.  Morbid obesity: Weight down since his last visit.  Encouraged ongoing efforts at weight loss including calorie restriction and regular exercise.  6.  Disposition: Follow-up fasting lipids and LFTs-he will come back for this.  Follow-up in clinic in 1 month or sooner if necessary.  Murray Hodgkins, NP 09/27/2019, 12:50 PM

## 2019-09-27 NOTE — Patient Instructions (Signed)
Medication Instructions:  1- INCREASE Amlodipine 1 tablet (10 mg total) once daily. *If you need a refill on your cardiac medications before your next appointment, please call your pharmacy*  Lab Work: Your physician recommends that you return for lab work in: 1 weeks at the medical mall. You will need to be fasting. (Lipids and LFT) No appt is needed. Hours are M-F 7AM- 6 PM.  If you have labs (blood work) drawn today and your tests are completely normal, you will receive your results only by: Marland Kitchen MyChart Message (if you have MyChart) OR . A paper copy in the mail If you have any lab test that is abnormal or we need to change your treatment, we will call you to review the results.  Testing/Procedures: None ordered   Follow-Up: At [facility], you and your health needs are our priority.  As part of our continuing mission to provide you with exceptional heart care, we have created designated Provider Care Teams.  These Care Teams include your primary Cardiologist (physician) and Advanced Practice Providers (APPs -  Physician Assistants and Nurse Practitioners) who all work together to provide you with the care you need, when you need it.  Your next appointment:   6 week(s)  The format for your next appointment:   In Person  Provider:    You may see Nelva Bush, MD or Murray Hodgkins, NP.

## 2019-09-29 ENCOUNTER — Telehealth: Payer: Self-pay | Admitting: *Deleted

## 2019-09-29 ENCOUNTER — Encounter: Payer: Self-pay | Admitting: *Deleted

## 2019-09-29 DIAGNOSIS — I214 Non-ST elevation (NSTEMI) myocardial infarction: Secondary | ICD-10-CM

## 2019-09-29 NOTE — Telephone Encounter (Signed)
Called to check on pt.  Out since 11/30.  Left message.  Had appointment with cardiologist yesterday.

## 2019-10-06 NOTE — Telephone Encounter (Signed)
Received forms. Placed in nurse box. 

## 2019-10-09 ENCOUNTER — Telehealth: Payer: Self-pay

## 2019-10-09 NOTE — Telephone Encounter (Signed)
LMOM

## 2019-10-11 ENCOUNTER — Encounter: Payer: Self-pay | Admitting: *Deleted

## 2019-10-11 ENCOUNTER — Telehealth: Payer: Self-pay

## 2019-10-11 DIAGNOSIS — I214 Non-ST elevation (NSTEMI) myocardial infarction: Secondary | ICD-10-CM

## 2019-10-11 NOTE — Telephone Encounter (Signed)
Patient has been absent from [facility] No answer, Left voicemail to call back.

## 2019-10-17 NOTE — Telephone Encounter (Signed)
Received completed form from physician, sent to [facility] via interoffice

## 2019-10-17 NOTE — Telephone Encounter (Signed)
Forms completed by Dr End and given to scheduler.

## 2019-10-18 ENCOUNTER — Encounter: Payer: Self-pay | Admitting: *Deleted

## 2019-10-18 DIAGNOSIS — I214 Non-ST elevation (NSTEMI) myocardial infarction: Secondary | ICD-10-CM

## 2019-10-18 NOTE — Progress Notes (Signed)
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 1029      Learning Barriers/Preferences   Learning Barriers  Sight   glassess   Learning Preferences  None       Education Topics:  AED/CPR: - Group verbal and written instruction with the use of models to demonstrate the basic use of the AED with the basic ABC's of resuscitation.   General Nutrition Guidelines/Fats and Fiber: -Group instruction provided by verbal, written material, models and posters to present the general guidelines for heart healthy nutrition. Gives an explanation and review of dietary fats and fiber.   Controlling Sodium/Reading Food Labels: -Group verbal and written material supporting the discussion of sodium use in heart healthy nutrition. Review and explanation with models, verbal and written materials for utilization of the food label.   Exercise Physiology & General Exercise Guidelines: - Group  verbal and written instruction with models to review the exercise physiology of the cardiovascular system and associated critical values. Provides general exercise guidelines with specific guidelines to those with heart or lung disease.    Aerobic Exercise & Resistance Training: - Gives group verbal and written instruction on the various components of exercise. Focuses on aerobic and resistive training programs and the benefits of this training and how to safely progress through these programs..   Flexibility, Balance, Mind/Body Relaxation: Provides group verbal/written instruction on the benefits of flexibility and balance training, including mind/body exercise modes such as yoga, pilates and tai chi.  Demonstration and skill practice provided.   Stress and Anxiety: - Provides group verbal and written instruction about the health risks of elevated stress and causes of high stress.  Discuss the correlation between heart/lung disease and anxiety and treatment options. Review healthy ways to manage with stress and anxiety.   Depression: - Provides group verbal and written instruction on the correlation between heart/lung disease and depressed mood, treatment options, and the stigmas associated with seeking treatment.   Anatomy & Physiology of the Heart: - Group verbal and written instruction and models provide basic cardiac anatomy and physiology, with the coronary electrical and arterial systems. Review of Valvular disease and Heart Failure   Cardiac Procedures: - Group verbal and written instruction to review commonly prescribed medications for heart disease. Reviews the medication, class of the drug, and  side effects. Includes the steps to properly store meds and maintain the prescription regimen. (beta blockers and nitrates)   Cardiac Medications I: - Group verbal and written instruction to review commonly prescribed medications for heart disease. Reviews the medication, class of the  drug, and side effects. Includes the steps to properly store meds and maintain the prescription regimen.   Cardiac Medications II: -Group verbal and written instruction to review commonly prescribed medications for heart disease. Reviews the medication, class of the drug, and side effects. (all other drug classes)    Go Sex-Intimacy & Heart Disease, Get SMART - Goal Setting: - Group verbal and written instruction through game format to discuss heart disease and the return to sexual intimacy. Provides group verbal and written material to discuss and apply goal setting through the application of the S.M.A.R.T. Method.   Other Matters of the Heart: - Provides group verbal, written materials and models to describe Stable Angina and Peripheral Artery. Includes description of the disease process and treatment options available to the cardiac patient.   Exercise & Equipment Safety: - Individual verbal instruction and demonstration of equipment use and safety with use of the equipment.   Cardiac Rehab from 08/17/2019 in [facility] Cardiac and Pulmonary Rehab  Date  08/17/19  Educator  [facility]  Instruction Review Code  1- Verbalizes Understanding      Infection Prevention: - Provides verbal and written material to individual with discussion of infection control including proper hand washing and proper equipment cleaning during exercise session.   Cardiac Rehab from 08/17/2019 in [facility] Cardiac and Pulmonary Rehab  Date  08/17/19  Educator  [facility]  Instruction Review Code  1- Verbalizes Understanding      Falls Prevention: - Provides verbal and written material to individual with discussion of falls prevention and safety.   Cardiac Rehab from 08/17/2019 in [facility] Cardiac and Pulmonary Rehab  Date  08/17/19  Educator  [facility]  Instruction Review Code  1- Verbalizes Understanding      Diabetes: - Individual verbal and written instruction to review signs/symptoms of diabetes, desired ranges of glucose level  fasting, after meals and with exercise. Acknowledge that pre and post exercise glucose checks will be done for 3 sessions at entry of program.   Know Your Numbers and Risk Factors: -Group verbal and written instruction about important numbers in your health.  Discussion of what are risk factors and how they play a role in the disease process.  Review of Cholesterol, Blood Pressure, Diabetes, and BMI and the role they play in your overall health.   Sleep Hygiene: -Provides group verbal and written instruction about how sleep can affect your health.  Define sleep hygiene, discuss sleep cycles and impact of sleep habits. Review good sleep hygiene tips.    Other: -Provides group and verbal instruction on various topics (see comments)   Knowledge Questionnaire Score: Knowledge Questionnaire Score - 08/17/19 1348      Knowledge Questionnaire Score   Pre Score  20/26 Education Focus: A&P, Angina, Nurtition, Exercise       Core Components/Risk Factors/Patient Goals at Admission: Personal Goals and Risk Factors at Admission - 08/17/19 1349      Core Components/Risk Factors/Patient Goals on Admission    Weight Management  Yes;Weight Loss;Obesity    Intervention  Weight Management: Provide education and appropriate resources to help participant work on and attain dietary goals.;Weight Management: Develop a combined nutrition and exercise program designed to reach desired caloric intake, while maintaining appropriate intake of nutrient  and fiber, sodium and fats, and appropriate energy expenditure required for the weight goal.;Weight Management/Obesity: Establish reasonable short term and long term weight goals.;Obesity: Provide education and appropriate resources to help participant work on and attain dietary goals.    Admit Weight  271 lb 4.8 oz (123.1 kg)    Goal Weight: Short Term  265 lb (120.2 kg)    Goal Weight: Long Term  260 lb (117.9 kg)    Expected Outcomes  Short Term: Continue to  assess and modify interventions until short term weight is achieved;Long Term: Adherence to nutrition and physical activity/exercise program aimed toward attainment of established weight goal;Weight Loss: Understanding of general recommendations for a balanced deficit meal plan, which promotes 1-2 lb weight loss per week and includes a negative energy balance of [phone] kcal/d;Understanding recommendations for meals to include 15-35% energy as protein, 25-35% energy from fat, 35-60% energy from carbohydrates, less than 232m of dietary cholesterol, 20-35 gm of total fiber daily;Understanding of distribution of calorie intake throughout the day with the consumption of 4-5 meals/snacks    Tobacco Cessation  Yes    Number of packs per day  1/2    Intervention  Assist the participant in steps to quit. Provide individualized education and counseling about committing to Tobacco Cessation, relapse prevention, and pharmacological support that can be provided by physician.;OAdvice worker assist with locating and accessing local/national Quit Smoking programs, and support quit date choice.    Expected Outcomes  Short Term: Will demonstrate readiness to quit, by selecting a quit date.;Short Term: Will quit all tobacco product use, adhering to prevention of relapse plan.;Long Term: Complete abstinence from all tobacco products for at least 12 months from quit date.    Hypertension  Yes    Intervention  Provide education on lifestyle modifcations including regular physical activity/exercise, weight management, moderate sodium restriction and increased consumption of fresh fruit, vegetables, and low fat dairy, alcohol moderation, and smoking cessation.;Monitor prescription use compliance.    Expected Outcomes  Short Term: Continued assessment and intervention until BP is < 140/980mHG in hypertensive participants. < 130/8022mG in hypertensive participants with diabetes, heart failure or chronic kidney  disease.;Long Term: Maintenance of blood pressure at goal levels.    Lipids  Yes    Intervention  Provide education and support for participant on nutrition & aerobic/resistive exercise along with prescribed medications to achieve LDL <49m42mDL >40mg101m Expected Outcomes  Short Term: Participant states understanding of desired cholesterol values and is compliant with medications prescribed. Participant is following exercise prescription and nutrition guidelines.;Long Term: Cholesterol controlled with medications as prescribed, with individualized exercise RX and with personalized nutrition plan. Value goals: LDL < 49mg,54m > 40 mg.       Core Components/Risk Factors/Patient Goals Review:  Goals and Risk Factor Review    Row Name 08/25/19 0915 09/01/19 0917           Core Components/Risk Factors/Patient Goals Review   Personal Goals Review  Hypertension  Weight Management/Obesity;Hypertension;Tobacco Cessation      Review  AdrianOddiead some medication changes for his blood pressure.  He was seen in the office on Wed for high BP.  He will be getting a bp cuff for home use to start tracking at home.  AdrianCorderrobtained a blood pressure cuff for home use. He is using his checking his blood pressure around lunch time or if he is feeling bad. He has been getting reading of 140's/80's. Patiennt  is intersted in quitting smoking. His smoking habits are varaiable at the moment. He is chewing regular gum, staying busy to try to quit and exercising. Informed him of ways to quit smoking and ways to help him cope with is urges.      Expected Outcomes  Short: Get BP cuff to start tracking at home.  Long: Continue monitor BP closely.  Short: stay below a half pack of cigarettes a day. Long: quit smoking.         Core Components/Risk Factors/Patient Goals at Discharge (Final Review):  Goals and Risk Factor Review - 09/01/19 0917      Core Components/Risk Factors/Patient Goals Review   Personal Goals  Review  Weight Management/Obesity;Hypertension;Tobacco Cessation    Review  Wiliam has obtained a blood pressure cuff for home use. He is using his checking his blood pressure around lunch time or if he is feeling bad. He has been getting reading of 140's/80's. Patiennt is intersted in quitting smoking. His smoking habits are varaiable at the moment. He is chewing regular gum, staying busy to try to quit and exercising. Informed him of ways to quit smoking and ways to help him cope with is urges.    Expected Outcomes  Short: stay below a half pack of cigarettes a day. Long: quit smoking.       ITP Comments: ITP Comments    Row Name 08/16/19 1057 08/17/19 1342 08/18/19 0916 08/21/19 0855 08/23/19 0718   ITP Comments  Completed virtual orientation.  Documentation for diagnosis can be found in [facility] encounter 08/12/19.  EP eval scheduled for 11am tomorrow.  Completed 6MWT and gym orientation.  Initial ITP created and sent for review to Dr. Emily Filbert, Medical Director.  First full day of exercise!  Patient was oriented to gym and equipment including functions, settings, policies, and procedures.  Patient's individual exercise prescription and treatment plan were reviewed.  All starting workloads were established based on the results of the 6 minute walk test done at initial orientation visit.  The plan for exercise progression was also introduced and progression will be customized based on patient's performance and goals.  --  30 day review completed. Continue with ITP sent to Dr. Emily Filbert, Medical Director of Cardiac and Pulmonary Rehab for review , changes as needed and signature.   New to program   Row Name 08/25/19 0913 08/30/19 1021 08/31/19 1336 09/06/19 0924 09/06/19 1048   ITP Comments  Tobacco  down to 1/4 pack a day last report was 1/2 pack on 10/29  BP up on arrival.  Had not taken meds today. Planned to go home take meds and recheck BP after 30-45 min.  To call MD if BP remains elevated   Initial RD eval completed  BP remains elevated especially diastolic and he states he is not sleeping well at night. Summary of BP's at sessions given to address both issues with MD.  Note to Dr End sent regarding symptoms, lack of sleep and elevated BP. Amarien has visit this Friday with Dr End. [place] Name 09/11/19 1008 09/13/19 0928 09/20/19 1103 09/29/19 0933 10/11/19 1313   ITP Comments  New Med prescribed. Will pick up today  BP med was not available for pickup. Has not started new med yet.  30 day review competed . ITP sent to Dr Emily Filbert for review, changes as needed and ITP approval signature.  Called to check on pt.  Out since 11/30.  Left message.  Had  appointment with cardiologist yesterday.  Patient has been absent from [facility]. No answer, Left voicemail to call back.   [place] Name 10/18/19 0930           ITP Comments  30 day review competed . ITP sent to Dr Emily Filbert for review, changes as needed and ITP approval signature  HAs been absent since 11/30          Comments:

## 2019-10-23 ENCOUNTER — Encounter: Payer: Self-pay | Admitting: *Deleted

## 2019-10-23 DIAGNOSIS — I214 Non-ST elevation (NSTEMI) myocardial infarction: Secondary | ICD-10-CM

## 2019-11-02 ENCOUNTER — Encounter: Payer: Self-pay | Admitting: *Deleted

## 2019-11-02 DIAGNOSIS — I214 Non-ST elevation (NSTEMI) myocardial infarction: Secondary | ICD-10-CM

## 2019-11-02 NOTE — Progress Notes (Signed)
Discharge Progress Report  Patient Details  Name: Marcus Bauer MRN: [id number] Date of Birth: 03-Jul-1981 Referring Provider:     Cardiac Rehab from 08/17/2019 in [facility]  Referring Provider  End, Harrell Gave MD       Number of Visits: 12  Reason for Discharge:  Early Exit:  Lack of attendance  Smoking History:  Social History   Tobacco Use  Smoking Status Current Every Day Smoker  . Packs/day: 0.50  . Years: 18.00  . Pack years: 9.00  . Types: Cigarettes  Smokeless Tobacco Current User  . Types: Chew  Tobacco Comment   down to 1/4 pack a day 08/25/2019    Diagnosis:  NSTEMI (non-ST elevated myocardial infarction) [facility])  ADL [facility]:   Initial Exercise Prescription: Initial Exercise Prescription - 08/17/19 1300      Date of Initial Exercise RX and Referring Provider   Date  08/17/19    Referring Provider  End, Harrell Gave MD      Treadmill   MPH  2    Grade  1    Minutes  15    METs  2.81      Recumbant Bike   Level  3    RPM  50    Watts  63    Minutes  15    METs  3      NuStep   Level  4    SPM  80    Minutes  15    METs  3      Elliptical   Level  1    Speed  2.5    Minutes  15      REL-XR   Level  3    Speed  50    Minutes  15    METs  3      T5 Nustep   Level  4    SPM  80    Minutes  15    METs  3      Prescription Details   Frequency (times per week)  3    Duration  Progress to 30 minutes of continuous aerobic without signs/symptoms of physical distress      Intensity   THRR 40-80% of Max Heartrate  117-162    Ratings of Perceived Exertion  11-13    Perceived Dyspnea  0-4      Progression   Progression  Continue to progress workloads to maintain intensity without signs/symptoms of physical distress.      Resistance Training   Training Prescription  Yes    Weight  5 lbs    Reps  10-15       Discharge Exercise Prescription (Final Exercise Prescription Changes): Exercise Prescription  Changes - 09/11/19 1400      Response to Exercise   Blood Pressure (Admit)  154/82    Blood Pressure (Exercise)  150/90    Blood Pressure (Exit)  142/80    Heart Rate (Admit)  93 bpm    Heart Rate (Exercise)  101 bpm    Heart Rate (Exit)  62 bpm    Rating of Perceived Exertion (Exercise)  11    Symptoms  none    Duration  Continue with 30 min of aerobic exercise without signs/symptoms of physical distress.    Intensity  THRR unchanged      Progression   Progression  Continue to progress workloads to maintain intensity without signs/symptoms of physical distress.    Average METs  3.37      Resistance Training   Training Prescription  Yes    Weight  5 lb    Reps  10-15      Interval Training   Interval Training  Yes    Equipment  NuStep    Comments  2 min off 30 sec on      Treadmill   MPH  3    Grade  1    Minutes  15    METs  3.71      Recumbant Bike   Level  4    Minutes  15    METs  3.6      NuStep   Level  5    Minutes  15      Elliptical   Level  1    Speed  3.5    Minutes  15      REL-XR   Level  6    Minutes  15    METs  5.2      T5 Nustep   Level  4    Minutes  15    METs  2.3      Home Exercise Plan   Plans to continue exercise at  Home (comment)   walk   Frequency  Add 2 additional days to program exercise sessions.    Initial Home Exercises Provided  08/21/19       Functional Capacity: 6 Minute Walk    Row Name 08/17/19 1343         6 Minute Walk   Phase  Initial     Distance  1060 feet     Walk Time  6 minutes     # of Rest Breaks  0     MPH  2     METS  3.62     RPE  7     VO2 Peak  12.68     Symptoms  No     Resting HR  71 bpm     Resting BP  134/68     Resting Oxygen Saturation   97 %     Exercise Oxygen Saturation  during 6 min walk  96 %     Max Ex. HR  106 bpm     Max Ex. BP  144/74     2 Minute Post BP  136/72        Psychological, QOL, Others - Outcomes: PHQ 2/9: Depression screen PHQ 2/9 08/17/2019   Decreased Interest 0  Down, Depressed, Hopeless 3  PHQ - 2 Score 3  Altered sleeping 1  Tired, decreased energy 2  Change in appetite 0  Feeling bad or failure about yourself  1  Trouble concentrating 0  Moving slowly or fidgety/restless 2  Suicidal thoughts 0  PHQ-9 Score 9  Difficult doing work/chores Somewhat difficult    Quality of Life: Quality of Life - 08/17/19 1348      Quality of Life   Select  Quality of Life      Quality of Life Scores   Health/Function Pre  15.73 %    Socioeconomic Pre  16.07 %    Psych/Spiritual Pre  8.64 %    Family Pre  21.6 %    GLOBAL Pre  15.2 %       Personal Goals: Goals established at orientation with interventions provided to work toward goal. Personal Goals and Risk Factors at Admission - 08/17/19 1349      Core  Components/Risk Factors/Patient Goals on Admission    Weight Management  Yes;Weight Loss;Obesity    Intervention  Weight Management: Provide education and appropriate resources to help participant work on and attain dietary goals.;Weight Management: Develop a combined nutrition and exercise program designed to reach desired caloric intake, while maintaining appropriate intake of nutrient and fiber, sodium and fats, and appropriate energy expenditure required for the weight goal.;Weight Management/Obesity: Establish reasonable short term and long term weight goals.;Obesity: Provide education and appropriate resources to help participant work on and attain dietary goals.    Admit Weight  271 lb 4.8 oz (123.1 kg)    Goal Weight: Short Term  265 lb (120.2 kg)    Goal Weight: Long Term  260 lb (117.9 kg)    Expected Outcomes  Short Term: Continue to assess and modify interventions until short term weight is achieved;Long Term: Adherence to nutrition and physical activity/exercise program aimed toward attainment of established weight goal;Weight Loss: Understanding of general recommendations for a balanced deficit meal plan, which  promotes 1-2 lb weight loss per week and includes a negative energy balance of [phone] kcal/d;Understanding recommendations for meals to include 15-35% energy as protein, 25-35% energy from fat, 35-60% energy from carbohydrates, less than 200mg  of dietary cholesterol, 20-35 gm of total fiber daily;Understanding of distribution of calorie intake throughout the day with the consumption of 4-5 meals/snacks    Tobacco Cessation  Yes    Number of packs per day  1/2    Intervention  Assist the participant in steps to quit. Provide individualized education and counseling about committing to Tobacco Cessation, relapse prevention, and pharmacological support that can be provided by physician.; , assist with locating and accessing local/national Quit Smoking programs, and support quit date choice.    Expected Outcomes  Short Term: Will demonstrate readiness to quit, by selecting a quit date.;Short Term: Will quit all tobacco product use, adhering to prevention of relapse plan.;Long Term: Complete abstinence from all tobacco products for at least 12 months from quit date.    Hypertension  Yes    Intervention  Provide education on lifestyle modifcations including regular physical activity/exercise, weight management, moderate sodium restriction and increased consumption of fresh fruit, vegetables, and low fat dairy, alcohol moderation, and smoking cessation.;Monitor prescription use compliance.    Expected Outcomes  Short Term: Continued assessment and intervention until BP is < 140/15mm HG in hypertensive participants. < 130/23mm HG in hypertensive participants with diabetes, heart failure or chronic kidney disease.;Long Term: Maintenance of blood pressure at goal levels.    Lipids  Yes    Intervention  Provide education and support for participant on nutrition & aerobic/resistive exercise along with prescribed medications to achieve LDL 70mg , HDL >40mg .    Expected Outcomes  Short Term:  Participant states understanding of desired cholesterol values and is compliant with medications prescribed. Participant is following exercise prescription and nutrition guidelines.;Long Term: Cholesterol controlled with medications as prescribed, with individualized exercise RX and with personalized nutrition plan. Value goals: LDL < 70mg , HDL > 40 mg.        Personal Goals Discharge: Goals and Risk Factor Review    Row Name 08/25/19 0915 09/01/19 13/06/20           Core Components/Risk Factors/Patient Goals Review   Personal Goals Review  Hypertension  Weight Management/Obesity;Hypertension;Tobacco Cessation      Review  Chucky has had some medication changes for his blood pressure.  He was seen in the office on Wed for high BP.  He will be getting a bp cuff for home use to start tracking at home.  Koleen Nimroddrian has obtained a blood pressure cuff for home use. He is using his checking his blood pressure around lunch time or if he is feeling bad. He has been getting reading of 140's/80's. Patiennt is intersted in quitting smoking. His smoking habits are varaiable at the moment. He is chewing regular gum, staying busy to try to quit and exercising. Informed him of ways to quit smoking and ways to help him cope with is urges.      Expected Outcomes  Short: Get BP cuff to start tracking at home.  Long: Continue monitor BP closely.  Short: stay below a half pack of cigarettes a day. Long: quit smoking.         Exercise Goals and Review: Exercise Goals    Row Name 08/17/19 1347             Exercise Goals   Increase Physical Activity  Yes       Intervention  Provide advice, education, support and counseling about physical activity/exercise needs.;Develop an individualized exercise prescription for aerobic and resistive training based on initial evaluation findings, risk stratification, comorbidities and participant's personal goals.       Expected Outcomes  Short Term: Attend rehab on a regular basis to  increase amount of physical activity.;Long Term: Add in home exercise to make exercise part of routine and to increase amount of physical activity.;Long Term: Exercising regularly at least 3-5 days a week.       Increase Strength and Stamina  Yes       Intervention  Provide advice, education, support and counseling about physical activity/exercise needs.;Develop an individualized exercise prescription for aerobic and resistive training based on initial evaluation findings, risk stratification, comorbidities and participant's personal goals.       Expected Outcomes  Short Term: Increase workloads from initial exercise prescription for resistance, speed, and METs.;Short Term: Perform resistance training exercises routinely during rehab and add in resistance training at home;Long Term: Improve cardiorespiratory fitness, muscular endurance and strength as measured by increased METs and functional capacity (6MWT)       Able to understand and use rate of perceived exertion (RPE) scale  Yes       Intervention  Provide education and explanation on how to use RPE scale       Expected Outcomes  Short Term: Able to use RPE daily in rehab to express subjective intensity level;Long Term:  Able to use RPE to guide intensity level when exercising independently       Knowledge and understanding of Target Heart Rate Range (THRR)  Yes       Intervention  Provide education and explanation of THRR including how the numbers were predicted and where they are located for reference       Expected Outcomes  Short Term: Able to state/look up THRR;Short Term: Able to use daily as guideline for intensity in rehab;Long Term: Able to use THRR to govern intensity when exercising independently       Able to check pulse independently  Yes       Intervention  Provide education and demonstration on how to check pulse in carotid and radial arteries.;Review the importance of being able to check your own pulse for safety during independent  exercise       Expected Outcomes  Short Term: Able to explain why pulse checking is important during independent exercise;Long Term: Able to check pulse  independently and accurately       Understanding of Exercise Prescription  Yes       Intervention  Provide education, explanation, and written materials on patient's individual exercise prescription       Expected Outcomes  Long Term: Able to explain home exercise prescription to exercise independently;Short Term: Able to explain program exercise prescription          Exercise Goals Re-Evaluation: Exercise Goals Re-Evaluation    Row Name 08/18/19 0916 08/21/19 0935 08/30/19 1412 09/01/19 0932 09/11/19 1453     Exercise Goal Re-Evaluation   Exercise Goals Review  Knowledge and understanding of Target Heart Rate Range (THRR);Understanding of Exercise Prescription;Able to understand and use rate of perceived exertion (RPE) scale  Knowledge and understanding of Target Heart Rate Range (THRR);Understanding of Exercise Prescription;Able to understand and use rate of perceived exertion (RPE) scale;Increase Strength and Stamina;Increase Physical Activity;Able to understand and use Dyspnea scale;Able to check pulse independently  Increase Physical Activity;Increase Strength and Stamina;Understanding of Exercise Prescription  Increase Physical Activity;Increase Strength and Stamina;Able to check pulse independently  Increase Physical Activity;Increase Strength and Stamina;Able to check pulse independently   Comments  Reviewed RPE scale, THR and program prescription with pt today.  Pt voiced understanding and was given a copy of goals to take home.  Reviewed home exercise with pt today.  Pt plans to walk at home for exercise.  He is also considering rejoining at gym [employer] and to start running again. Reviewed THR, pulse, RPE, sign and symptoms, NTG use, and when to call 911 or MD.  Also discussed weather considerations and indoor options.  Pt voiced  understanding.  Koleen Nimroddrian has been doing well in rehab.  He is off to a good start and wanting to get back to running again.  He is already up to 39 watts on the bike and using intervals.  We will continue to monitor his progress.  Patient is exercising at home about 2 days a week and is exercising regularly at rehab. Koleen Nimroddrian has a positive attitude, is wearing his fit bit to check his heart rate and mapping his runs. He wants to keep exercising to help him reduce stress and quit smoking.  Koleen Nimroddrian has been doing well in rehab.  He is up to level 6 on the XR.  We will continue to monitor his progress.   Expected Outcomes  Short: Use RPE daily to regulate intensity. Long: Follow program prescription in THR.  Short: Start to walk at home on off days.  Long: Continue to exercise independently.  Short: Continue to boost workloads.  Long: Continue to walk more at home.  Short: continue to exercise to reduce stress and help quit smoking. Long: exercise independently after rehab.  Short: Continue to increase workloads and continue with intervals.  Long: Continue to improve stamina.   Row Name 10/11/19 1313             Exercise Goal Re-Evaluation   Comments  Out since 11/30          Nutrition & Weight - Outcomes: Pre Biometrics - 08/17/19 1347      Pre Biometrics   Height  5' 6.1" (1.679 m)    Weight  271 lb 4.8 oz (123.1 kg)    BMI (Calculated)  43.65    Single Leg Stand  30 seconds        Nutrition: Nutrition Therapy & Goals - 08/31/19 1315      Nutrition Therapy   Diet  HH, low Na diet    Protein (specify units)  100g    Fiber  30 grams    Whole Grain Foods  3 servings    Saturated Fats  12 max. grams    Fruits and Vegetables  5 servings/day    Sodium  1.5 grams      Personal Nutrition Goals   Nutrition Goal  ST: whole wheat bread LT: prevent anymore heart events, stop smoking (1 pack/day)    Comments  B: liquid egg (whites)- non-stick, chicken/turkey sausage, oatmeal, OJ or apple juice.  L: [place] sandwich (white bread) w/ lite mayo or can of soup (chicken noodle or beef with vegetable). S: nutrigrain bar or some fruit. D: fish or meat, once in a while will have a burger. Frozen vegetables, bake or grill meat/fish, meat ([place], chicken, lean red meat). Drinks: flavored water, may have one can of soda w/ dinner. Discussed HH eating, set point weight and health outcomes.      Intervention Plan   Intervention  Prescribe, educate and counsel regarding individualized specific dietary modifications aiming towards targeted core components such as weight, hypertension, lipid management, diabetes, heart failure and other comorbidities.;Nutrition handout(s) given to patient.    Expected Outcomes  Short Term Goal: Understand basic principles of dietary content, such as calories, fat, sodium, cholesterol and nutrients.;Short Term Goal: A plan has been developed with personal nutrition goals set during dietitian appointment.;Long Term Goal: Adherence to prescribed nutrition plan.       Nutrition Discharge: Nutrition Assessments - 08/17/19 1348      MEDFICTS Scores   Pre Score  62       Education Questionnaire Score: Knowledge Questionnaire Score - 08/17/19 1348      Knowledge Questionnaire Score   Pre Score  20/26 Education Focus: A&P, Angina, Nurtition, Exercise       Goals reviewed with patient; copy given to patient.

## 2019-11-02 NOTE — Progress Notes (Signed)
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 08/16/19 1029      Learning Barriers/Preferences   Learning Barriers  Sight   glassess   Learning Preferences  None       Education Topics:  AED/CPR: - Group verbal and written instruction with the use of models to demonstrate the basic use of the AED with the basic ABC's of resuscitation.   General Nutrition Guidelines/Fats and Fiber: -Group instruction provided by verbal, written material, models and posters to present the general guidelines for heart healthy nutrition. Gives an explanation and review of dietary fats and fiber.   Controlling Sodium/Reading Food Labels: -Group verbal and written material supporting the discussion of sodium use in heart healthy nutrition. Review and explanation with models, verbal and written materials for utilization of the food label.   Exercise Physiology & General Exercise Guidelines: - Group  verbal and written instruction with models to review the exercise physiology of the cardiovascular system and associated critical values. Provides general exercise guidelines with specific guidelines to those with heart or lung disease.    Aerobic Exercise & Resistance Training: - Gives group verbal and written instruction on the various components of exercise. Focuses on aerobic and resistive training programs and the benefits of this training and how to safely progress through these programs..   Flexibility, Balance, Mind/Body Relaxation: Provides group verbal/written instruction on the benefits of flexibility and balance training, including mind/body exercise modes such as yoga, pilates and tai chi.  Demonstration and skill practice provided.   Stress and Anxiety: - Provides group verbal and written instruction about the health risks of elevated stress and causes of high stress.  Discuss the correlation between heart/lung disease and anxiety and treatment options. Review healthy ways to manage with stress and anxiety.   Depression: - Provides group verbal and written instruction on the correlation between heart/lung disease and depressed mood, treatment options, and the stigmas associated with seeking treatment.   Anatomy & Physiology of the Heart: - Group verbal and written instruction and models provide basic cardiac anatomy and physiology, with the coronary electrical and arterial systems. Review of Valvular disease and Heart Failure   Cardiac Procedures: - Group verbal and written instruction to review commonly prescribed medications for heart disease. Reviews the medication, class of the drug, and  side effects. Includes the steps to properly store meds and maintain the prescription regimen. (beta blockers and nitrates)   Cardiac Medications I: - Group verbal and written instruction to review commonly prescribed medications for heart disease. Reviews the medication, class of the  drug, and side effects. Includes the steps to properly store meds and maintain the prescription regimen.   Cardiac Medications II: -Group verbal and written instruction to review commonly prescribed medications for heart disease. Reviews the medication, class of the drug, and side effects. (all other drug classes)    Go Sex-Intimacy & Heart Disease, Get SMART - Goal Setting: - Group verbal and written instruction through game format to discuss heart disease and the return to sexual intimacy. Provides group verbal and written material to discuss and apply goal setting through the application of the S.M.A.R.T. Method.   Other Matters of the Heart: - Provides group verbal, written materials and models to describe Stable Angina and Peripheral Artery. Includes description of the disease process and treatment options available to the cardiac patient.   Exercise & Equipment Safety: - Individual verbal instruction and demonstration of equipment use and safety with use of the equipment.   Cardiac Rehab from 08/17/2019 in [facility] Cardiac and Pulmonary Rehab  Date  08/17/19  Educator  [facility]  Instruction Review Code  1- Verbalizes Understanding      Infection Prevention: - Provides verbal and written material to individual with discussion of infection control including proper hand washing and proper equipment cleaning during exercise session.   Cardiac Rehab from 08/17/2019 in [facility] Cardiac and Pulmonary Rehab  Date  08/17/19  Educator  [facility]  Instruction Review Code  1- Verbalizes Understanding      Falls Prevention: - Provides verbal and written material to individual with discussion of falls prevention and safety.   Cardiac Rehab from 08/17/2019 in [facility] Cardiac and Pulmonary Rehab  Date  08/17/19  Educator  [facility]  Instruction Review Code  1- Verbalizes Understanding      Diabetes: - Individual verbal and written instruction to review signs/symptoms of diabetes, desired ranges of glucose level  fasting, after meals and with exercise. Acknowledge that pre and post exercise glucose checks will be done for 3 sessions at entry of program.   Know Your Numbers and Risk Factors: -Group verbal and written instruction about important numbers in your health.  Discussion of what are risk factors and how they play a role in the disease process.  Review of Cholesterol, Blood Pressure, Diabetes, and BMI and the role they play in your overall health.   Sleep Hygiene: -Provides group verbal and written instruction about how sleep can affect your health.  Define sleep hygiene, discuss sleep cycles and impact of sleep habits. Review good sleep hygiene tips.    Other: -Provides group and verbal instruction on various topics (see comments)   Knowledge Questionnaire Score: Knowledge Questionnaire Score - 08/17/19 1348      Knowledge Questionnaire Score   Pre Score  20/26 Education Focus: A&P, Angina, Nurtition, Exercise       Core Components/Risk Factors/Patient Goals at Admission: Personal Goals and Risk Factors at Admission - 08/17/19 1349      Core Components/Risk Factors/Patient Goals on Admission    Weight Management  Yes;Weight Loss;Obesity    Intervention  Weight Management: Provide education and appropriate resources to help participant work on and attain dietary goals.;Weight Management: Develop a combined nutrition and exercise program designed to reach desired caloric intake, while maintaining appropriate intake of nutrient  and fiber, sodium and fats, and appropriate energy expenditure required for the weight goal.;Weight Management/Obesity: Establish reasonable short term and long term weight goals.;Obesity: Provide education and appropriate resources to help participant work on and attain dietary goals.    Admit Weight  271 lb 4.8 oz (123.1 kg)    Goal Weight: Short Term  265 lb (120.2 kg)    Goal Weight: Long Term  260 lb (117.9 kg)    Expected Outcomes  Short Term: Continue to  assess and modify interventions until short term weight is achieved;Long Term: Adherence to nutrition and physical activity/exercise program aimed toward attainment of established weight goal;Weight Loss: Understanding of general recommendations for a balanced deficit meal plan, which promotes 1-2 lb weight loss per week and includes a negative energy balance of [phone] kcal/d;Understanding recommendations for meals to include 15-35% energy as protein, 25-35% energy from fat, 35-60% energy from carbohydrates, less than 247m of dietary cholesterol, 20-35 gm of total fiber daily;Understanding of distribution of calorie intake throughout the day with the consumption of 4-5 meals/snacks    Tobacco Cessation  Yes    Number of packs per day  1/2    Intervention  Assist the participant in steps to quit. Provide individualized education and counseling about committing to Tobacco Cessation, relapse prevention, and pharmacological support that can be provided by physician.;OAdvice worker assist with locating and accessing local/national Quit Smoking programs, and support quit date choice.    Expected Outcomes  Short Term: Will demonstrate readiness to quit, by selecting a quit date.;Short Term: Will quit all tobacco product use, adhering to prevention of relapse plan.;Long Term: Complete abstinence from all tobacco products for at least 12 months from quit date.    Hypertension  Yes    Intervention  Provide education on lifestyle modifcations including regular physical activity/exercise, weight management, moderate sodium restriction and increased consumption of fresh fruit, vegetables, and low fat dairy, alcohol moderation, and smoking cessation.;Monitor prescription use compliance.    Expected Outcomes  Short Term: Continued assessment and intervention until BP is < 140/964mHG in hypertensive participants. < 130/8073mG in hypertensive participants with diabetes, heart failure or chronic kidney  disease.;Long Term: Maintenance of blood pressure at goal levels.    Lipids  Yes    Intervention  Provide education and support for participant on nutrition & aerobic/resistive exercise along with prescribed medications to achieve LDL <30m1mDL >40mg39m Expected Outcomes  Short Term: Participant states understanding of desired cholesterol values and is compliant with medications prescribed. Participant is following exercise prescription and nutrition guidelines.;Long Term: Cholesterol controlled with medications as prescribed, with individualized exercise RX and with personalized nutrition plan. Value goals: LDL < 30mg,30m > 40 mg.       Core Components/Risk Factors/Patient Goals Review:  Goals and Risk Factor Review    Row Name 08/25/19 0915 09/01/19 0917           Core Components/Risk Factors/Patient Goals Review   Personal Goals Review  Hypertension  Weight Management/Obesity;Hypertension;Tobacco Cessation      Review  AdrianTaead some medication changes for his blood pressure.  He was seen in the office on Wed for high BP.  He will be getting a bp cuff for home use to start tracking at home.  AdrianDudleybtained a blood pressure cuff for home use. He is using his checking his blood pressure around lunch time or if he is feeling bad. He has been getting reading of 140's/80's. Patiennt  is intersted in quitting smoking. His smoking habits are varaiable at the moment. He is chewing regular gum, staying busy to try to quit and exercising. Informed him of ways to quit smoking and ways to help him cope with is urges.      Expected Outcomes  Short: Get BP cuff to start tracking at home.  Long: Continue monitor BP closely.  Short: stay below a half pack of cigarettes a day. Long: quit smoking.         Core Components/Risk Factors/Patient Goals at Discharge (Final Review):  Goals and Risk Factor Review - 09/01/19 0917      Core Components/Risk Factors/Patient Goals Review   Personal Goals  Review  Weight Management/Obesity;Hypertension;Tobacco Cessation    Review  Sherrill has obtained a blood pressure cuff for home use. He is using his checking his blood pressure around lunch time or if he is feeling bad. He has been getting reading of 140's/80's. Patiennt is intersted in quitting smoking. His smoking habits are varaiable at the moment. He is chewing regular gum, staying busy to try to quit and exercising. Informed him of ways to quit smoking and ways to help him cope with is urges.    Expected Outcomes  Short: stay below a half pack of cigarettes a day. Long: quit smoking.       ITP Comments: ITP Comments    Row Name 08/16/19 1057 08/17/19 1342 08/18/19 0916 08/21/19 0855 08/23/19 0718   ITP Comments  Completed virtual orientation.  Documentation for diagnosis can be found in [facility] encounter 08/12/19.  EP eval scheduled for 11am tomorrow.  Completed 6MWT and gym orientation.  Initial ITP created and sent for review to Dr. Emily Filbert, Medical Director.  First full day of exercise!  Patient was oriented to gym and equipment including functions, settings, policies, and procedures.  Patient's individual exercise prescription and treatment plan were reviewed.  All starting workloads were established based on the results of the 6 minute walk test done at initial orientation visit.  The plan for exercise progression was also introduced and progression will be customized based on patient's performance and goals.  --  30 day review completed. Continue with ITP sent to Dr. Emily Filbert, Medical Director of Cardiac and Pulmonary Rehab for review , changes as needed and signature.   New to program   Row Name 08/25/19 0913 08/30/19 1021 08/31/19 1336 09/06/19 0924 09/06/19 1048   ITP Comments  Tobacco  down to 1/4 pack a day last report was 1/2 pack on 10/29  BP up on arrival.  Had not taken meds today. Planned to go home take meds and recheck BP after 30-45 min.  To call MD if BP remains elevated   Initial RD eval completed  BP remains elevated especially diastolic and he states he is not sleeping well at night. Summary of BP's at sessions given to address both issues with MD.  Note to Dr End sent regarding symptoms, lack of sleep and elevated BP. Aidon has visit this Friday with Dr End. [place] Name 09/11/19 1008 09/13/19 0928 09/20/19 1103 09/29/19 0933 10/11/19 1313   ITP Comments  New Med prescribed. Will pick up today  BP med was not available for pickup. Has not started new med yet.  30 day review competed . ITP sent to Dr Emily Filbert for review, changes as needed and ITP approval signature.  Called to check on pt.  Out since 11/30.  Left message.  Had  appointment with cardiologist yesterday.  Patient has been absent from [facility]. No answer, Left voicemail to call back.   Row Name 10/18/19 0930 10/23/19 1554 11/02/19 1511       ITP Comments  30 day review competed . ITP sent to Dr Emily Filbert for review, changes as needed and ITP approval signature  HAs been absent since 11/30  Out since 11/30.  Sent letter.  Timothey has been out since 09/18/19 and has not returned our contact.  We will discharge him at this time.        Comments: Discharge ITP

## 2019-11-10 ENCOUNTER — Ambulatory Visit: Payer: No Typology Code available for payment source | Admitting: Internal Medicine

## 2019-11-10 NOTE — Progress Notes (Deleted)
Follow-up Outpatient Visit Date: 11/10/2019  Primary Care Provider: Dion Body, MD [place] [place] [address]  Chief Complaint: ***  HPI:  Marcus Bauer is a 39 y.o. male with history of coronary artery disease status post NSTEMI (07/2019) secondary to occlusion of distal RPDA that was too small for intervention, hypertension, OSA, GERD, asthma, and tobacco abuse, who presents for follow-up of coronary artery disease.  I last saw him in 08/2019, at which time he reported occasional chest tightness at rest.  We added amlodipine for management of his chest pain and elevated blood pressure.  At the time of his follow-up with Ignacia Bayley, NP, in 09/2019, he was doing well with only a single episode of chest pain that occurred at rest.  He was participating in cardiac rehab without any exertional symptoms.  Amlodipine was increased to 10 mg daily at that time.  --------------------------------------------------------------------------------------------------  Past Medical History:  Diagnosis Date  . (HFpEF) heart failure with preserved ejection fraction ([place])    a 07/2019 Echo: EF 60-65%. No signif valvular dzs; b. 07/2019 Cath: LVEDP 30-25mHg.  .Marland KitchenAnxiety   . Asthma   . CAD (coronary artery disease)    a. 07/2019 NSTEMI/Cath: LM nl, LAD nl, D1/2/3 nl, LCX nl, OM1 60, OM2/3 nl, RCA large, nl, RPDA 100 w/ L->R collats. EF 50-55%.  .Marland KitchenGERD (gastroesophageal reflux disease)   . Hypertension   . Sleep apnea    a. CPAP noncompliance.   Past Surgical History:  Procedure Laterality Date  . CARPOMETACARPAL ([place] FUSION OF THUMB Left 12/20/2018   Procedure: REPAIR OF THE ULNAR COLLATERAL LIGAMENT OF THUMB;  Surgeon: PCorky Mull MD;  Location: [facility] ORS;  Service: Orthopedics;  Laterality: Left;  ARTHREX SMALL JOINT INTERNAL BRACE KIT  . LEFT HEART CATH AND CORONARY ANGIOGRAPHY N/A 08/13/2019   Procedure: LEFT HEART CATH AND CORONARY ANGIOGRAPHY;  Surgeon:  ENelva Bush MD;  [facility];  Service: Cardiovascular;  Laterality: N/A;  . SHOULDER SURGERY Left      Recent CV Pertinent Labs: Lab Results  Component Value Date   CHOL 139 08/13/2019   HDL 42 08/13/2019   LDLCALC 73 08/13/2019   TRIG 118 08/13/2019   CHOLHDL 3.3 08/13/2019   INR 1.0 08/13/2019   K 4.1 08/15/2019   MG 2.1 08/15/2019   BUN 12 08/15/2019   CREATININE 1.19 08/15/2019    Past medical and surgical history were reviewed and updated in EPIC.  No outpatient medications have been marked as taking for the 11/10/19 encounter (Appointment) with Ephriam Turman, CHarrell Gave MD.    Allergies: Shellfish allergy, Hydrochlorothiazide, and Other  Social History   Tobacco Use  . Smoking status: Current Every Day Smoker    Packs/day: 0.50    Years: 18.00    Pack years: 9.00    Types: Cigarettes  . Smokeless tobacco: Current User    Types: Chew  . Tobacco comment: down to 1/4 pack a day 08/25/2019  Substance Use Topics  . Alcohol use: Yes    Comment: OCC-weekends, more than 6/day  . Drug use: Never    Family History  Problem Relation Age of Onset  . Heart disease Mother   . Hypertension Mother   . Hyperlipidemia Mother   . Hypertension Father   . Hyperlipidemia Father     Review of Systems: A 12-system review of systems was performed and was negative except as noted in the HPI.  --------------------------------------------------------------------------------------------------  Physical Exam: There  were no vitals taken for this visit.  General:  *** HEENT: No conjunctival pallor or scleral icterus. Facemask in place. Neck: Supple without lymphadenopathy, thyromegaly, JVD, or HJR. Lungs: Normal work of breathing. Clear to auscultation bilaterally without wheezes or crackles. Heart: Regular rate and rhythm without murmurs, rubs, or gallops. Non-displaced PMI. Abd: Bowel sounds present. Soft, NT/ND without hepatosplenomegaly Ext: No lower  extremity edema. Radial, PT, and DP pulses are 2+ bilaterally. Skin: Warm and dry without rash.  EKG:  ***  Lab Results  Component Value Date   WBC 9.0 08/15/2019   HGB 13.6 08/15/2019   HCT 41.6 08/15/2019   MCV 85.6 08/15/2019   PLT 159 08/15/2019    Lab Results  Component Value Date   NA 139 08/15/2019   K 4.1 08/15/2019   CL 106 08/15/2019   CO2 26 08/15/2019   BUN 12 08/15/2019   CREATININE 1.19 08/15/2019   GLUCOSE 106 (H) 08/15/2019    Lab Results  Component Value Date   CHOL 139 08/13/2019   HDL 42 08/13/2019   LDLCALC 73 08/13/2019   TRIG 118 08/13/2019   CHOLHDL 3.3 08/13/2019    --------------------------------------------------------------------------------------------------  ASSESSMENT AND PLAN: ***  Nelva Bush, MD 11/10/2019 7:43 AM

## 2019-11-10 NOTE — Progress Notes (Deleted)
Follow-up Outpatient Visit Date: 11/15/2019  Primary Care Provider: Dion Body, MD [facility] [place] [address]  Chief Complaint: ***  HPI:  Marcus Bauer is a 39 y.o. male with history of coronary artery disease status post NSTEMI (07/2019) secondary to occlusion of distal RPDA that was too small for intervention, hypertension, OSA, GERD, asthma, and tobacco abuse, who presents for follow-up of coronary artery disease.  I last saw him in 08/2019, at which time he reported occasional chest tightness at rest.  We added amlodipine for management of his chest pain and elevated blood pressure.  At the time of his follow-up with Ignacia Bayley, NP, in 09/2019, he was doing well with only a single episode of chest pain that occurred at rest.  He was participating in cardiac rehab without any exertional symptoms.  Amlodipine was increased to 10 mg daily at that time.  --------------------------------------------------------------------------------------------------  Past Medical History:  Diagnosis Date  . (HFpEF) heart failure with preserved ejection fraction ([place])    a 07/2019 Echo: EF 60-65%. No signif valvular dzs; b. 07/2019 Cath: LVEDP 30-83mHg.  .Marland KitchenAnxiety   . Asthma   . CAD (coronary artery disease)    a. 07/2019 NSTEMI/Cath: LM nl, LAD nl, D1/2/3 nl, LCX nl, OM1 60, OM2/3 nl, RCA large, nl, RPDA 100 w/ L->R collats. EF 50-55%.  .Marland KitchenGERD (gastroesophageal reflux disease)   . Hypertension   . Sleep apnea    a. CPAP noncompliance.   Past Surgical History:  Procedure Laterality Date  . CARPOMETACARPAL ([place] FUSION OF THUMB Left 12/20/2018   Procedure: REPAIR OF THE ULNAR COLLATERAL LIGAMENT OF THUMB;  Surgeon: PCorky Mull MD;  Location: [facility] ORS;  Service: Orthopedics;  Laterality: Left;  ARTHREX SMALL JOINT INTERNAL BRACE KIT  . LEFT HEART CATH AND CORONARY ANGIOGRAPHY N/A 08/13/2019   Procedure: LEFT HEART CATH AND CORONARY ANGIOGRAPHY;  Surgeon:  ENelva Bush MD;  [facility];  Service: Cardiovascular;  Laterality: N/A;  . SHOULDER SURGERY Left      Recent CV Pertinent Labs: Lab Results  Component Value Date   CHOL 139 08/13/2019   HDL 42 08/13/2019   LDLCALC 73 08/13/2019   TRIG 118 08/13/2019   CHOLHDL 3.3 08/13/2019   INR 1.0 08/13/2019   K 4.1 08/15/2019   MG 2.1 08/15/2019   BUN 12 08/15/2019   CREATININE 1.19 08/15/2019    Past medical and surgical history were reviewed and updated in EPIC.  No outpatient medications have been marked as taking for the 11/15/19 encounter (Appointment) with Djuana Littleton, CHarrell Gave MD.    Allergies: Shellfish allergy, Hydrochlorothiazide, and Other  Social History   Tobacco Use  . Smoking status: Current Every Day Smoker    Packs/day: 0.50    Years: 18.00    Pack years: 9.00    Types: Cigarettes  . Smokeless tobacco: Current User    Types: Chew  . Tobacco comment: down to 1/4 pack a day 08/25/2019  Substance Use Topics  . Alcohol use: Yes    Comment: OCC-weekends, more than 6/day  . Drug use: Never    Family History  Problem Relation Age of Onset  . Heart disease Mother   . Hypertension Mother   . Hyperlipidemia Mother   . Hypertension Father   . Hyperlipidemia Father     Review of Systems: A 12-system review of systems was performed and was negative except as noted in the HPI.  --------------------------------------------------------------------------------------------------  Physical Exam: There  were no vitals taken for this visit.  General:  *** HEENT: No conjunctival pallor or scleral icterus. Facemask in place. Neck: Supple without lymphadenopathy, thyromegaly, JVD, or HJR. Lungs: Normal work of breathing. Clear to auscultation bilaterally without wheezes or crackles. Heart: Regular rate and rhythm without murmurs, rubs, or gallops. Non-displaced PMI. Abd: Bowel sounds present. Soft, NT/ND without hepatosplenomegaly Ext: No lower  extremity edema. Radial, PT, and DP pulses are 2+ bilaterally. Skin: Warm and dry without rash.  EKG:  ***  Lab Results  Component Value Date   WBC 9.0 08/15/2019   HGB 13.6 08/15/2019   HCT 41.6 08/15/2019   MCV 85.6 08/15/2019   PLT 159 08/15/2019    Lab Results  Component Value Date   NA 139 08/15/2019   K 4.1 08/15/2019   CL 106 08/15/2019   CO2 26 08/15/2019   BUN 12 08/15/2019   CREATININE 1.19 08/15/2019   GLUCOSE 106 (H) 08/15/2019    Lab Results  Component Value Date   CHOL 139 08/13/2019   HDL 42 08/13/2019   LDLCALC 73 08/13/2019   TRIG 118 08/13/2019   CHOLHDL 3.3 08/13/2019    --------------------------------------------------------------------------------------------------  ASSESSMENT AND PLAN: [name], MD 11/10/2019 1:00 PM

## 2019-11-15 ENCOUNTER — Ambulatory Visit: Payer: No Typology Code available for payment source | Admitting: Internal Medicine

## 2019-11-16 ENCOUNTER — Encounter: Payer: Self-pay | Admitting: Internal Medicine

## 2019-12-11 ENCOUNTER — Ambulatory Visit: Payer: No Typology Code available for payment source | Attending: Internal Medicine

## 2019-12-11 ENCOUNTER — Other Ambulatory Visit: Payer: Self-pay

## 2019-12-11 DIAGNOSIS — Z20822 Contact with and (suspected) exposure to covid-19: Secondary | ICD-10-CM

## 2019-12-12 LAB — NOVEL CORONAVIRUS, NAA: SARS-CoV-2, NAA: NOT DETECTED

## 2020-05-15 ENCOUNTER — Emergency Department
Admission: EM | Admit: 2020-05-15 | Discharge: 2020-05-15 | Disposition: A | Discharge status: Home / Self Care | Payer: No Typology Code available for payment source | Attending: Emergency Medicine | Admitting: Emergency Medicine

## 2020-05-15 ENCOUNTER — Encounter: Payer: Self-pay | Admitting: Emergency Medicine

## 2020-05-15 ENCOUNTER — Other Ambulatory Visit: Payer: Self-pay

## 2020-05-15 DIAGNOSIS — R42 Dizziness and giddiness: Secondary | ICD-10-CM | POA: Insufficient documentation

## 2020-05-15 DIAGNOSIS — Z5321 Procedure and treatment not carried out due to patient leaving prior to being seen by health care provider: Secondary | ICD-10-CM | POA: Diagnosis not present

## 2020-05-15 LAB — COMPREHENSIVE METABOLIC PANEL
ALT: 23 U/L (ref 0–44)
AST: 24 U/L (ref 15–41)
Albumin: 4.9 g/dL (ref 3.5–5.0)
Alkaline Phosphatase: 48 U/L (ref 38–126)
Anion gap: 11 (ref 5–15)
BUN: 11 mg/dL (ref 6–20)
CO2: 27 mmol/L (ref 22–32)
Calcium: 8.9 mg/dL (ref 8.9–10.3)
Chloride: 105 mmol/L (ref 98–111)
Creatinine, Ser: 1.39 mg/dL — ABNORMAL HIGH (ref 0.61–1.24)
GFR calc Af Amer: >60 mL/min (ref >60)
GFR calc non Af Amer: >60 mL/min (ref >60)
Glucose, Bld: 98 mg/dL (ref 70–99)
Potassium: 4.2 mmol/L (ref 3.5–5.1)
Sodium: 143 mmol/L (ref 135–145)
Total Bilirubin: 1.1 mg/dL (ref 0.3–1.2)
Total Protein: 8.1 g/dL (ref 6.5–8.1)

## 2020-05-15 LAB — CBC WITH DIFFERENTIAL/PLATELET
Abs Immature Granulocytes: 0.05 10*3/uL (ref 0.00–0.07)
Basophils Absolute: 0 10*3/uL (ref 0.0–0.1)
Basophils Relative: 0 %
Eosinophils Absolute: 0 10*3/uL (ref 0.0–0.5)
Eosinophils Relative: 0 %
HCT: 44.5 % (ref 39.0–52.0)
Hemoglobin: 15 g/dL (ref 13.0–17.0)
Immature Granulocytes: 0 %
Lymphocytes Relative: 14 %
Lymphs Abs: 1.8 10*3/uL (ref 0.7–4.0)
MCH: 28.9 pg (ref 26.0–34.0)
MCHC: 33.7 g/dL (ref 30.0–36.0)
MCV: 85.7 fL (ref 80.0–100.0)
Monocytes Absolute: 0.9 10*3/uL (ref 0.1–1.0)
Monocytes Relative: 7 %
Neutro Abs: 10.2 10*3/uL — ABNORMAL HIGH (ref 1.7–7.7)
Neutrophils Relative %: 79 %
Platelets: 198 10*3/uL (ref 150–400)
RBC: 5.19 MIL/uL (ref 4.22–5.81)
RDW: 14.6 % (ref 11.5–15.5)
WBC: 12.9 10*3/uL — ABNORMAL HIGH (ref 4.0–10.5)
nRBC: 0 % (ref 0.0–0.2)

## 2020-05-15 LAB — URINALYSIS, COMPLETE (UACMP) WITH MICROSCOPIC
Bacteria, UA: NONE SEEN
Bilirubin Urine: NEGATIVE
Glucose, UA: NEGATIVE mg/dL
Ketones, ur: NEGATIVE mg/dL
Leukocytes,Ua: NEGATIVE
Nitrite: NEGATIVE
Protein, ur: 30 mg/dL — AB
Specific Gravity, Urine: 1.026 (ref 1.005–1.030)
Squamous Epithelial / HPF: NONE SEEN (ref 0–5)
pH: 5 (ref 5.0–8.0)

## 2020-05-15 LAB — TROPONIN I (HIGH SENSITIVITY): Troponin I (High Sensitivity): 27 ng/L — ABNORMAL HIGH (ref <18)

## 2020-05-15 NOTE — ED Notes (Signed)
No answer when called several times from lobby 

## 2020-05-15 NOTE — ED Notes (Signed)
Wife reports pt sitting outside in car until exam room ready

## 2020-05-15 NOTE — ED Triage Notes (Signed)
Pt to triage via w/c with no distress noted; pt reports N/V and dizziness since last night

## 2020-05-16 ENCOUNTER — Telehealth: Payer: Self-pay | Admitting: Emergency Medicine

## 2020-05-16 NOTE — Telephone Encounter (Signed)
Called patient due to lwot to inquire about condition and follow up plans. Left message.   

## 2020-06-25 ENCOUNTER — Ambulatory Visit: Payer: Self-pay | Admitting: Physician Assistant

## 2020-06-25 ENCOUNTER — Other Ambulatory Visit: Payer: Self-pay

## 2020-06-25 DIAGNOSIS — J45909 Unspecified asthma, uncomplicated: Secondary | ICD-10-CM | POA: Insufficient documentation

## 2020-06-25 DIAGNOSIS — G4733 Obstructive sleep apnea (adult) (pediatric): Secondary | ICD-10-CM | POA: Insufficient documentation

## 2020-06-25 DIAGNOSIS — Z113 Encounter for screening for infections with a predominantly sexual mode of transmission: Secondary | ICD-10-CM

## 2020-06-25 LAB — GRAM STAIN

## 2020-06-25 NOTE — Progress Notes (Signed)
Gram stain reviewed, no tx per provider orders. Provider orders completed. 

## 2020-06-26 ENCOUNTER — Encounter: Payer: Self-pay | Admitting: Physician Assistant

## 2020-06-26 NOTE — Progress Notes (Signed)
[facility] Department STI clinic/screening visit  Subjective:  Marcus Bauer is a 39 y.o. male being seen today for an STI screening visit. The patient reports they do not have symptoms.    Patient has the following medical conditions:   Patient Active Problem List   Diagnosis Date Noted  . Asthma without status asthmaticus 06/25/2020  . OSA on CPAP 06/25/2020  . Essential hypertension 09/09/2019  . Morbid obesity (HCC) 09/09/2019  . Coronary artery disease of native artery of native heart with stable angina pectoris (HCC) 08/23/2019  . GAD (generalized anxiety disorder) 08/23/2019  . Non-ST elevation (NSTEMI) myocardial infarction (HCC)   . Chest pain with high risk of acute coronary syndrome 08/12/2019     Chief Complaint  Patient presents with  . SEXUALLY TRANSMITTED DISEASE    screening    HPI  Patient reports that he is not having any symptoms but would like a screening today.  Reports that he takes a BP med, a cholesterol med and a blood thinner.  Reports that his last HIV test was several years ago.  Last void prior to sample collection for Gram stain was about 1 hr ago.   See flowsheet for further details and programmatic requirements.    The following portions of the patient's history were reviewed and updated as appropriate: allergies, current medications, past medical history, past social history, past surgical history and problem list.  Objective:  There were no vitals filed for this visit.  Physical Exam Constitutional:      General: He is not in acute distress.    Appearance: Normal appearance.  HENT:     Head: Normocephalic and atraumatic.     Comments: No nits, lice, or hair loss. No cervical, supraclavicular or axillary adenopathy.    Mouth/Throat:     Mouth: Mucous membranes are moist.     Pharynx: Oropharynx is clear. No oropharyngeal exudate or posterior oropharyngeal erythema.  Eyes:     Conjunctiva/sclera: Conjunctivae normal.   Pulmonary:     Effort: Pulmonary effort is normal.  Abdominal:     Palpations: Abdomen is soft. There is no mass.     Tenderness: There is no abdominal tenderness. There is no guarding or rebound.  Genitourinary:    Penis: Normal.      Testes: Normal.     Comments: Pubic area without nits, lice, edema, erythema, lesions and inguinal adenopathy. Penis circumcised and without rash, lesions and discharge at meatus. Musculoskeletal:     Cervical back: Neck supple. No tenderness.  Skin:    General: Skin is warm and dry.     Findings: No bruising, erythema, lesion or rash.  Neurological:     Mental Status: He is alert and oriented to person, place, and time.  Psychiatric:        Mood and Affect: Mood normal.        Behavior: Behavior normal.        Thought Content: Thought content normal.        Judgment: Judgment normal.       Assessment and Plan:  Marcus Bauer is a 39 y.o. male presenting to the [facility] Department for STI screening  1. Screening for STD (sexually transmitted disease) Patient into clinic without symptoms. Rec condoms with all sex. Await test results.  Counseled that RN will call if needs to RTC for treatment once results are back. - Gram stain - Gonococcus culture - HIV [facility] - Syphilis Serology,  [facility] - Gonococcus culture     No follow-ups on file.  No future appointments.  Matt Holmes, PA

## 2020-06-30 LAB — GONOCOCCUS CULTURE

## 2020-07-01 ENCOUNTER — Other Ambulatory Visit: Payer: Self-pay

## 2020-07-01 ENCOUNTER — Emergency Department (HOSPITAL_COMMUNITY)
Admission: EM | Admit: 2020-07-01 | Discharge: 2020-07-01 | Disposition: A | Discharge status: Home / Self Care | Payer: No Typology Code available for payment source | Attending: Emergency Medicine | Admitting: Emergency Medicine

## 2020-07-01 ENCOUNTER — Encounter (HOSPITAL_COMMUNITY): Payer: Self-pay | Admitting: Emergency Medicine

## 2020-07-01 ENCOUNTER — Emergency Department (HOSPITAL_COMMUNITY): Payer: No Typology Code available for payment source

## 2020-07-01 DIAGNOSIS — Z720 Tobacco use: Secondary | ICD-10-CM

## 2020-07-01 DIAGNOSIS — I10 Essential (primary) hypertension: Secondary | ICD-10-CM | POA: Diagnosis not present

## 2020-07-01 DIAGNOSIS — Z9114 Patient's other noncompliance with medication regimen: Secondary | ICD-10-CM | POA: Diagnosis not present

## 2020-07-01 DIAGNOSIS — F17210 Nicotine dependence, cigarettes, uncomplicated: Secondary | ICD-10-CM | POA: Insufficient documentation

## 2020-07-01 DIAGNOSIS — I209 Angina pectoris, unspecified: Secondary | ICD-10-CM

## 2020-07-01 DIAGNOSIS — Z9119 Patient's noncompliance with other medical treatment and regimen: Secondary | ICD-10-CM | POA: Diagnosis not present

## 2020-07-01 DIAGNOSIS — I110 Hypertensive heart disease with heart failure: Secondary | ICD-10-CM | POA: Insufficient documentation

## 2020-07-01 DIAGNOSIS — R0789 Other chest pain: Secondary | ICD-10-CM | POA: Diagnosis present

## 2020-07-01 DIAGNOSIS — I25118 Atherosclerotic heart disease of native coronary artery with other forms of angina pectoris: Secondary | ICD-10-CM | POA: Diagnosis not present

## 2020-07-01 DIAGNOSIS — I509 Heart failure, unspecified: Secondary | ICD-10-CM | POA: Insufficient documentation

## 2020-07-01 DIAGNOSIS — J45909 Unspecified asthma, uncomplicated: Secondary | ICD-10-CM | POA: Insufficient documentation

## 2020-07-01 DIAGNOSIS — Z7982 Long term (current) use of aspirin: Secondary | ICD-10-CM | POA: Insufficient documentation

## 2020-07-01 DIAGNOSIS — Z20822 Contact with and (suspected) exposure to covid-19: Secondary | ICD-10-CM | POA: Diagnosis not present

## 2020-07-01 DIAGNOSIS — I2510 Atherosclerotic heart disease of native coronary artery without angina pectoris: Secondary | ICD-10-CM | POA: Diagnosis not present

## 2020-07-01 DIAGNOSIS — I208 Other forms of angina pectoris: Secondary | ICD-10-CM

## 2020-07-01 DIAGNOSIS — Z79899 Other long term (current) drug therapy: Secondary | ICD-10-CM | POA: Insufficient documentation

## 2020-07-01 LAB — BASIC METABOLIC PANEL
Anion gap: 9 (ref 5–15)
BUN: 7 mg/dL (ref 6–20)
CO2: 27 mmol/L (ref 22–32)
Calcium: 9 mg/dL (ref 8.9–10.3)
Chloride: 102 mmol/L (ref 98–111)
Creatinine, Ser: 1.54 mg/dL — ABNORMAL HIGH (ref 0.61–1.24)
GFR calc Af Amer: >60 mL/min (ref >60)
GFR calc non Af Amer: 57 mL/min — ABNORMAL LOW (ref >60)
Glucose, Bld: 95 mg/dL (ref 70–99)
Potassium: 3.5 mmol/L (ref 3.5–5.1)
Sodium: 138 mmol/L (ref 135–145)

## 2020-07-01 LAB — CBC
HCT: 44.5 % (ref 39.0–52.0)
Hemoglobin: 14.8 g/dL (ref 13.0–17.0)
MCH: 28.9 pg (ref 26.0–34.0)
MCHC: 33.3 g/dL (ref 30.0–36.0)
MCV: 86.9 fL (ref 80.0–100.0)
Platelets: 228 10*3/uL (ref 150–400)
RBC: 5.12 MIL/uL (ref 4.22–5.81)
RDW: 15.3 % (ref 11.5–15.5)
WBC: 11.1 10*3/uL — ABNORMAL HIGH (ref 4.0–10.5)
nRBC: 0 % (ref 0.0–0.2)

## 2020-07-01 LAB — SARS CORONAVIRUS 2 BY RT PCR (HOSPITAL ORDER, PERFORMED IN ~~LOC~~ HOSPITAL LAB): SARS Coronavirus 2: NEGATIVE

## 2020-07-01 LAB — TROPONIN I (HIGH SENSITIVITY)
Troponin I (High Sensitivity): 13 ng/L (ref <18)
Troponin I (High Sensitivity): 13 ng/L (ref <18)

## 2020-07-01 MED ORDER — ASPIRIN EC 81 MG PO TBEC
81.0000 mg | DELAYED_RELEASE_TABLET | Freq: Every day | ORAL | Status: DC
  Administered 2020-07-01: 81 mg via ORAL
  Filled 2020-07-01: qty 1

## 2020-07-01 MED ORDER — CARVEDILOL 3.125 MG PO TABS: 12.5000 mg | ORAL_TABLET | Freq: Two times a day (BID) | ORAL | Status: DC

## 2020-07-01 MED ORDER — ATORVASTATIN CALCIUM 40 MG PO TABS: 80.0000 mg | ORAL_TABLET | Freq: Every day | ORAL | Status: DC

## 2020-07-01 MED ORDER — NITROGLYCERIN 0.4 MG SL SUBL: 0.4000 mg | SUBLINGUAL_TABLET | SUBLINGUAL | Status: DC | PRN

## 2020-07-01 MED ORDER — AMLODIPINE BESYLATE 5 MG PO TABS
5.0000 mg | ORAL_TABLET | Freq: Every day | ORAL | Status: DC
  Administered 2020-07-01: 5 mg via ORAL
  Filled 2020-07-01: qty 1

## 2020-07-01 NOTE — ED Provider Notes (Signed)
Patient signed out to me by Dr. Preston Fleeting pending results of troponin.  For troponin is not elevated however patient does note recurring substernal chest pain and pressure.  Will consult cardiology for admission   Lorre Nick, MD 07/01/20 [phone]

## 2020-07-01 NOTE — ED Notes (Signed)
Pt discharge instructions reviewed with the patient. The patient verbalized understanding of instructions. Pt discharged. 

## 2020-07-01 NOTE — ED Triage Notes (Signed)
Patient was at work, was walking and felt the chest pain come on.  He describes pain as a squeezing, tightening, which eases off and comes back on.  Patient does have some mild shortness of breath with the pain, radiates to shoulder, neck and left arm.  Patient took 2 of his nitro with no relief.  Patient was given 324mg  ASA by EMS and one sl nitro.  Patient is rated the same, but has moved in his chest.

## 2020-07-01 NOTE — Consult Note (Addendum)
Cardiology Consultation:   Patient ID: BREYLAN LEFEVERS; [id number]; [birth year]   Admit date: 07/01/2020 Date of Consult: 07/01/2020  Primary Care Provider: Dion Body, MD Primary Cardiologist: Dr. Nelva Bush, MD   Patient Profile:   Marcus Bauer is a 39 y.o. male with a hx of CAD s/p NSTEMI 07/2019 (60% OM1, RPDA 100% with L>R collaterals with preserved EF at 50-55% with normalized EF), HTN, OSA (noncompliant with CPAP), GERD, asthma, and tobacco use who presented to [facility] on 07/01/20 with chest pain.  History of Present Illness:   Marcus Bauer is a 39yo M with a hx as stated above who presented to [facility] 07/01/20 after an episode of chest pain while at work (works 3rd shift). He states that he was at work in his usual state of health and began having a heavy, tight sensation in his left upper chest with associated palpitations/fluttering while walking around. He denies pain radiation however reports mild dizziness and nausea. He took 2 SL NTG tablets with no symptom relief therefore he called EMS for further evaluation. On their arrival, he was given an additional SL NTG tablet and ASA with complete relief and no symptom return. He states that since being seen by cardiology (09/2019), he has stopped all of his medications. He checks his BP and was concerned when SBPs reached the 190 range. He says that he was getting confused with what his PCP versus cardiology was telling him to take. He continues to smoke approximately 1 PPD and reports he has been having panic attacks since his MI in 07/2019. He states that he continues to lose weight, from 315lb to 243lb since 07/2019. Although happy about his weight loss, he reports that he has not necessarily tried to loose the weight. He states that his appetite is poor and he thinks that his small portion sizes are contributing .  On hospital presentation, BP was 149/108. CXR with no acute cardiopulmomary process. hsT found to be 27>>13>>13. EKG  with NSR with old inferior MI changes, however improved from prior tracing. CXR with no evidence of cardiopulmomary disease.   He was initially seen by cardiology service 07/2019 after presenting to [facility] with acute left sided chest pain with radiation to the left arm. EKG did not meet STEMI criteria. Cath revealed occlusion of the distal RPDA, which was too small for intervention, along with a 60% ostial stenosis of the OM1. EF was 50-55% with moderately increased LVEDP. Medical therapy was recommended. Follow-up echo showed normal LV function with an EF of 60 to 65%.  He was most recently seen 09/27/2019 at which time it was reported he continued to participate in cardiac rehabilitation with good tolerance. He did report one episode of chest tightness that was moderately relieved with SL NTG. He did not have any exertional symptoms. BP was monitored at home with elevated diastolic pressures. He was continued on his regimen of ASA, statin, beta-blocker, Plavix, and ARB therapies. His amlodipine was titrated to 10 to help with angina.    Past Medical History:  Diagnosis Date  . (HFpEF) heart failure with preserved ejection fraction ([place])    a 07/2019 Echo: EF 60-65%. No signif valvular dzs; b. 07/2019 Cath: LVEDP 30-7mHg.  .Marland KitchenAnxiety   . Asthma   . CAD (coronary artery disease)    a. 07/2019 NSTEMI/Cath: LM nl, LAD nl, D1/2/3 nl, LCX nl, OM1 60, OM2/3 nl, RCA large, nl, RPDA 100 w/ L->R collats. EF 50-55%.  .Marland KitchenGERD (gastroesophageal reflux  disease)   . Hypertension   . Sleep apnea    a. CPAP noncompliance.    Past Surgical History:  Procedure Laterality Date  . CARPOMETACARPAL ([place]) FUSION OF THUMB Left 12/20/2018   Procedure: REPAIR OF THE ULNAR COLLATERAL LIGAMENT OF THUMB;  Surgeon: Marcus Mull, MD;  Location: [facility] ORS;  Service: Orthopedics;  Laterality: Left;  ARTHREX SMALL JOINT INTERNAL BRACE KIT  . LEFT HEART CATH AND CORONARY ANGIOGRAPHY N/A 08/13/2019   Procedure: LEFT HEART CATH  AND CORONARY ANGIOGRAPHY;  Surgeon: Marcus Bush, MD;  Location: [facility];  Service: Cardiovascular;  Laterality: N/A;  . SHOULDER SURGERY Left      Prior to Admission medications   Medication Sig Start Date End Date Taking? Authorizing Provider  albuterol (PROVENTIL HFA;VENTOLIN HFA) 108 (90 Base) MCG/ACT inhaler Inhale 2 puffs into the lungs every 6 (six) hours as needed for wheezing or shortness of breath.   Yes [provider]  amLODipine (NORVASC) 10 MG tablet Take 1 tablet (10 mg total) by mouth daily. 09/27/19 07/01/20 Yes Marcus Gianotti, NP  atorvastatin (LIPITOR) 80 MG tablet Take 1 tablet (80 mg total) by mouth daily at 6 PM. 08/15/19  Yes Marcus Flock, MD  carvedilol (COREG) 25 MG tablet Take 1 tablet (25 mg total) by mouth 2 (two) times daily. 08/15/19 08/14/20 Yes Marcus Flock, MD  nitroGLYCERIN (NITROSTAT) 0.4 MG SL tablet Place 1 tablet (0.4 mg total) under the tongue every 5 (five) minutes x 3 doses as needed for chest pain. 09/08/19  Yes End, Harrell Gave, MD  aspirin EC 81 MG EC tablet Take 1 tablet (81 mg total) by mouth daily. Patient not taking: Reported on 07/01/2020 08/16/19   Marcus Flock, MD  clopidogrel (PLAVIX) 75 MG tablet Take 1 tablet (75 mg total) by mouth daily with breakfast. Patient not taking: Reported on 07/01/2020 08/16/19   Marcus Flock, MD  losartan (COZAAR) 50 MG tablet Take 2 tablets (100 mg total) by mouth daily. Patient not taking: Reported on 07/01/2020 08/15/19 08/14/20  Marcus Flock, MD    Inpatient Medications: Scheduled Meds:  Continuous Infusions:  PRN Meds:   Allergies:    Allergies  Allergen Reactions  . Shellfish Allergy Shortness Of Breath  . Hydrochlorothiazide Other (See Comments)    Intolerance - malaise  . Other Hives    CLOROX    Social History:   Social History   Socioeconomic History  . Marital status: Married    Spouse name: Not on file  . Number of children: Not on  file  . Years of education: Not on file  . Highest education level: Not on file  Occupational History  . Not on file  Tobacco Use  . Smoking status: Current Every Day Smoker    Packs/day: 0.50    Years: 18.00    Pack years: 9.00    Types: Cigarettes  . Smokeless tobacco: Current User    Types: Chew  . Tobacco comment: down to 1/4 pack a day 08/25/2019  Vaping Use  . Vaping Use: Never used  Substance and Sexual Activity  . Alcohol use: Yes    Comment: OCC-weekends, more than 6/day  . Drug use: Never  . Sexual activity: Yes    Partners: Female  Other Topics Concern  . Not on file  Social History Narrative  . Not on file   Social Determinants of Health   Financial Resource Strain:   . Difficulty of Paying Living Expenses: Not on file  Food  Insecurity:   . Worried About Charity fundraiser in the Last Year: Not on file  . Ran Out of Food in the Last Year: Not on file  Transportation Needs:   . Lack of Transportation (Medical): Not on file  . Lack of Transportation (Non-Medical): Not on file  Physical Activity:   . Days of Exercise per Week: Not on file  . Minutes of Exercise per Session: Not on file  Stress:   . Feeling of Stress : Not on file  Social Connections:   . Frequency of Communication with Friends and Family: Not on file  . Frequency of Social Gatherings with Friends and Family: Not on file  . Attends Religious Services: Not on file  . Active Member of Clubs or Organizations: Not on file  . Attends Archivist Meetings: Not on file  . Marital Status: Not on file  Intimate Partner Violence:   . Fear of Current or Ex-Partner: Not on file  . Emotionally Abused: Not on file  . Physically Abused: Not on file  . Sexually Abused: Not on file    Family History:   Family History  Problem Relation Age of Onset  . Heart disease Mother   . Hypertension Mother   . Hyperlipidemia Mother   . Hypertension Father   . Hyperlipidemia Father    Family Status:   Family Status  Relation Name Status  . Mother  Deceased  . Father  Deceased    ROS:  Please see the history of present illness.  All other ROS reviewed and negative.     Physical Exam/Data:   Vitals:   07/01/20 1000 07/01/20 1015 07/01/20 1030 07/01/20 1200  BP: (!) 166/123 (!) 158/112 (!) 159/115 (!) 169/120  Pulse: 85 78 81 82  Resp: _0 Temp:      TempSrc:      SpO2: 98% 100% 96% 97%  Weight:      Height:       No intake or output data in the 24 hours ending 07/01/20 1222 Filed Weights   07/01/20 0606  Weight: 110.2 kg   Bauer mass index is 38.06 kg/m.   General: Overweight, NAD Skin: Warm, dry, intact  Neck: Negative for carotid bruits. No JVD Lungs: Diminished in bilateral upper lobes. Breathing is unlabored. Cardiovascular: RRR with S1 S2. No murmurs Abdomen: Soft, non-tender, non-distended. No obvious abdominal masses. Extremities: No edema. Radial pulses 2+ bilaterally Neuro: Alert and oriented. No focal deficits. No facial asymmetry. MAE spontaneously. Psych: Responds to questions appropriately with fairly flat affect.    EKG:  The ECG that was done 07/01/20 was personally reviewed and demonstrates NSR with evidence of old inferior infarct and no acute ST changes, HR 89bpm   Relevant CV Studies:  LHC 08/13/2019:  Conclusions: 1. Two-vessel coronary artery disease involving small to moderate sized branches, with occlusion of distal rPDA (culprit lesion) and 60% ostial OMA stenosis. 2. Low normal left ventricular systolic function (LVEF 14-43%) with inferior hypokinesis. 3. Moderately elevated left ventricular filling pressure.  Recommendations: 1. Medical therapy for NSTEMI; distal rPDA lesion is too small/distal for PCI. Restart heparin 2 hours after TR band deflation, to complete 48 hour course. I will also load with clopidogrel 300 mg daily and continue dual antiplatelet therapy with aspirin and clopidogrel for 12 months. 2. Aggressive  secondary prevention, including high-intensity statin therapy, smoking cessation, and blood pressure control. 3. Elevated LVEDP suggests diastolic dysfunction. If renal function tolerates, consider  gentle diuresis tomorrow.  Diagnostic Dominance: Right  Intervention  Laboratory Data:  Chemistry Recent Labs  Lab 07/01/20 0603  NA 138  K 3.5  CL 102  CO2 27  GLUCOSE 95  BUN 7  CREATININE 1.54*  CALCIUM 9.0  GFRNONAA 57*  GFRAA >60  ANIONGAP 9    Total Protein  Date Value Ref Range Status  05/15/2020 8.1 6.5 - 8.1 g/dL Final   Albumin  Date Value Ref Range Status  05/15/2020 4.9 3.5 - 5.0 g/dL Final   AST  Date Value Ref Range Status  05/15/2020 24 15 - 41 U/L Final   ALT  Date Value Ref Range Status  05/15/2020 23 0 - 44 U/L Final   Alkaline Phosphatase  Date Value Ref Range Status  05/15/2020 48 38 - 126 U/L Final   Total Bilirubin  Date Value Ref Range Status  05/15/2020 1.1 0.3 - 1.2 mg/dL Final   Hematology Recent Labs  Lab 07/01/20 0603  WBC 11.1*  RBC 5.12  HGB 14.8  HCT 44.5  MCV 86.9  MCH 28.9  MCHC 33.3  RDW 15.3  PLT 228   Cardiac EnzymesNo results for input(s): TROPONINI in the last 168 hours. No results for input(s): TROPIPOC in the last 168 hours.  BNPNo results for input(s): BNP, PROBNP in the last 168 hours.  DDimer No results for input(s): DDIMER in the last 168 hours. TSH:  Lab Results  Component Value Date   TSH 0.371 08/12/2019   Lipids: Lab Results  Component Value Date   CHOL 139 08/13/2019   HDL 42 08/13/2019   LDLCALC 73 08/13/2019   TRIG 118 08/13/2019   CHOLHDL 3.3 08/13/2019   HgbA1c: Lab Results  Component Value Date   HGBA1C 5.6 08/12/2019    Radiology/Studies:  DG Chest 2 View  Result Date: 07/01/2020 CLINICAL DATA:  Chest pain EXAM: CHEST - 2 VIEW COMPARISON:  08/12/2019 chest CT FINDINGS: Normal heart size and mediastinal contours. There is no edema, consolidation, effusion, or pneumothorax.  Artifact from EKG leads and piercings. IMPRESSION: No evidence of active disease. Electronically Signed   By: Monte Fantasia M.D.   On: 07/01/2020 06:42   Assessment and Plan:   1. Chest pain with known hx of CAD: -Pt presented after an episode of chest pain while at work (works 3rd shift). He states that he was at work in his usual state of health and began having a heavy, tight sensation in his left upper chest with associated palpitations/fluttering while walking around. He denies pain radiation however reports mild dizziness and nausea. He took 2 SL NTG tablets with no symptom relief therefore he called EMS for further evaluation. On their arrival, he was given an additional SL NTG tablet and ASA with complete relief and no symptom return.  -NSTEMI in 07/2019 with occlusion of the distal RPDA, which was too small for intervention, along with a 60% ostial stenosis of the OM1. EF was 50-55% with moderately increased LVEDP. Medical therapy was recommended. Follow-up echo showed normal LV function with an EF of 60 to 65%. -EKG with no acute changes>>evidence of old inferior MI  -hsT, 21>>13>>13 -CXR with no acute cardiopulmonary disease  -BP elevated at 149/108 -There no urgent concern for acute CAD progression given stable EKG and flat hsT levels. Would restart medications for now including ASA 81, amlodipine at 5, carvedilol at 12.5>>>restart losartan if renal function normalizes at follow up. If symptoms persist, would pursue further ischemic workup up with LHC  to fully define coronary anatomy   2. Essential HTN: -Unstable, 159/115>158/112>166/123 -Pt reports stopping all medications several months ago, now with poorly controlled BPs>>SBP reported in the 190-200 range -On PTA amlodipine, carvedilol, losartan>>up titrated at last OV to 76m due to non-exertional chest pain symptoms  -Would restart amlodipine at 5 and carvedilol at 12.5>>>restart losartan if renal function normalizes at follow  up  3. HLD: -LDL in 07/2019 at 73 with an LDL goal of <70 -Repeat lipid panel in the OP setting   4. Tobacco use: -Ongoing tobacco use>>>reports 1PPD  -Cessation strongly encouraged   5. Acute on chronic kidney disease, stage II: -Creatinine, 1.54 today with a baseline of 1.1 -Encourage plain water intake  -Follow labs OP   6. Unintentional weight loss>>likely secondary to anxiety: -Reports an approximate 70lb weight loss since his MI 10/22020 -He states that he continues to lose weight, from 315lb to 243lb since 07/2019. Although happy about his weight loss, he reports that he has not necessarily tried to loose the weight. He states that his appetite is poor and he thinks that his small portion sizes are contributing.  -Also reports hx of current panic attacks/anxiety for which he was previously treated>>would recommend following closely with his PCP for further management as he could benefit from the addition of an SSRI    For questions or updates, please contact CEllentonPlease consult [email or web address] for contact info under Cardiology/STEMI.   Signed, JKathyrn DrownNP-C [facility] Pager: [phone]/13/2021 12:22 PM  Patient seen and examined and agree with above  Came in with sensation of chest fluttering, EKG and telemetry have been normal personally reviewed.  Feels better.  Sleepy.  Has been anxious and depressed at home.  Has lost weight.  Prior cardiac catheterization personally reviewed.  No PCI was performed in October 2020.  Collateral blood flow noted.  Still continues to smoke quite heavily.  On exam-Sleepy but easily arousable, wife in room.  Lungs are clear heart is regular no JVD he is alert and oriented x3  Lab work reviewed, creatinine 1.5, high-sensitivity troponin is within normal range.  Assessment and plan:  Atypical chest pain/fluttering -Overall reassurance at this point.  No evidence of MI.  Uncontrolled hypertension -Stopped all of  his medications.  We will go ahead and resume at half dose as above, hold his losartan since his creatinine is slightly up.  Close follow-up.  Basic metabolic profile as outpatient.  Weight loss/anxiety -Likely going hand-in-hand.  May benefit from SSRI.  He will discuss with his primary doctor.  Has felt overwhelmed recently.  CAD -Catheterization as described above.  Collateral blood flow noted.  No PCI was performed in October.  Continue with aggressive medical management.  Tobacco use -Strongly encourage cessation.  Okay for discharge from ER with close follow-up.  MCandee Furbish MD

## 2020-07-01 NOTE — ED Notes (Signed)
Patient returned from xray.

## 2020-07-01 NOTE — ED Notes (Signed)
Patient transported to X-ray 

## 2020-07-01 NOTE — ED Provider Notes (Signed)
[facility] EMERGENCY DEPARTMENT Provider Note   CSN: [id number] Arrival date & time: 07/01/20  0548   History Chief Complaint  Patient presents with  . Chest Pain    Marcus Bauer is a 39 y.o. male.  The history is provided by the patient.  Chest Pain He has history of hypertension, coronary artery disease, heart failure with preserved ejection fraction and comes in because of an episode of chest pain.  He was at work and he noted a tight feeling and heavy feeling in the left side of his chest without radiation.  There is no associated dyspnea, nausea, diaphoresis.  He could feel his heart pounding.  He took 2 nitroglycerin without any relief so he called an ambulance.  The nitroglycerin did burn under his tongue and give him a headache.  EMS gave him aspirin and a third nitroglycerin and the symptoms have completely resolved.  Of note, patient continues to smoke cigarettes.  He also had stopped taking all of his medications several months ago.  Past Medical History:  Diagnosis Date  . (HFpEF) heart failure with preserved ejection fraction ([place])    a 07/2019 Echo: EF 60-65%. No signif valvular dzs; b. 07/2019 Cath: LVEDP 30-66mHg.  .Marland KitchenAnxiety   . Asthma   . CAD (coronary artery disease)    a. 07/2019 NSTEMI/Cath: LM nl, LAD nl, D1/2/3 nl, LCX nl, OM1 60, OM2/3 nl, RCA large, nl, RPDA 100 w/ L->R collats. EF 50-55%.  .Marland KitchenGERD (gastroesophageal reflux disease)   . Hypertension   . Sleep apnea    a. CPAP noncompliance.    Patient Active Problem List   Diagnosis Date Noted  . Asthma without status asthmaticus 06/25/2020  . OSA on CPAP 06/25/2020  . Essential hypertension 09/09/2019  . Morbid obesity ([place] 09/09/2019  . Coronary artery disease of native artery of native heart with stable angina pectoris ([facility] 08/23/2019  . GAD (generalized anxiety disorder) 08/23/2019  . Non-ST elevation (NSTEMI) myocardial infarction ([place]   . Chest pain with high risk of  acute coronary syndrome 08/12/2019    Past Surgical History:  Procedure Laterality Date  . CAR[place]ARPAL [place] FUS[place]B Left 12/20/2018   Procedure: REPAIR OF THE ULNAR COLLATERAL LIGAMENT OF THUMB;  Surgeon: PCorky Mull MD;  Location: [facility] ORS;  Service: Orthopedics;  Laterality: Left;  ARTHREX SMALL JOINT INTERNAL BRACE KIT  . LEFT HEART CATH AND CORONARY ANGIOGRAPHY N/A 08/13/2019   Procedure: LEFT HEART CATH AND CORONARY ANGIOGRAPHY;  Surgeon: ENelva Bush MD;  [facility];  Service: Cardiovascular;  Laterality: N/A;  . SHOULDER SURGERY Left        Family History  Problem Relation Age of Onset  . Heart disease Mother   . Hypertension Mother   . Hyperlipidemia Mother   . Hypertension Father   . Hyperlipidemia Father     Social History   Tobacco Use  . Smoking status: Current Every Day Smoker    Packs/day: 0.50    Years: 18.00    Pack years: 9.00    Types: Cigarettes  . Smokeless tobacco: Current User    Types: Chew  . Tobacco comment: down to 1/4 pack a day 08/25/2019  Vaping Use  . Vaping Use: Never used  Substance Use Topics  . Alcohol use: Yes    Comment: OCC-weekends, more than 6/day  . Drug use: Never    Home Medications Prior to Admission medications   Medication Sig Start Date End Date  Taking? Authorizing Provider  albuterol (PROVENTIL HFA;VENTOLIN HFA) 108 (90 Base) MCG/ACT inhaler Inhale 2 puffs into the lungs every 6 (six) hours as needed for wheezing or shortness of breath.    [provider]  amLODipine (NORVASC) 10 MG tablet Take 1 tablet (10 mg total) by mouth daily. 09/27/19 12/26/19  Theora Gianotti, NP  aspirin EC 81 MG EC tablet Take 1 tablet (81 mg total) by mouth daily. 08/16/19   Dustin Flock, MD  atorvastatin (LIPITOR) 80 MG tablet Take 1 tablet (80 mg total) by mouth daily at 6 PM. 08/15/19   Dustin Flock, MD  calcium carbonate (TUMS - DOSED IN MG ELEMENTAL CALCIUM) 500 MG chewable tablet  Chew 1 tablet by mouth as needed for indigestion or heartburn.    [provider]  carvedilol (COREG) 25 MG tablet Take 1 tablet (25 mg total) by mouth 2 (two) times daily. 08/15/19 08/14/20  Dustin Flock, MD  clopidogrel (PLAVIX) 75 MG tablet Take 1 tablet (75 mg total) by mouth daily with breakfast. 08/16/19   Dustin Flock, MD  escitalopram (LEXAPRO) 10 MG tablet Take 10 mg by mouth daily. 08/23/19   [provider]  losartan (COZAAR) 50 MG tablet Take 2 tablets (100 mg total) by mouth daily. 08/15/19 08/14/20  Dustin Flock, MD  nitroGLYCERIN (NITROSTAT) 0.4 MG SL tablet Place 1 tablet (0.4 mg total) under the tongue every 5 (five) minutes x 3 doses as needed for chest pain. 09/08/19   End, Harrell Gave, MD    Allergies    Shellfish allergy, Hydrochlorothiazide, and Other  Review of Systems   Review of Systems  Cardiovascular: Positive for chest pain.  All other systems reviewed and are negative.   Physical Exam Updated Vital Signs BP (!) 149/108 (BP Location: Right Arm)   Pulse 92   Temp 98.4 F (36.9 C) (Oral)   Resp 19   Ht 5' 7"  (1.702 m)   Wt 110.2 kg   SpO2 99%   BMI 38.06 kg/m   Physical Exam Vitals and nursing note reviewed.   39 year old male, resting comfortably and in no acute distress. Vital signs are significant for elevated blood pressure. Oxygen saturation is 99%, which is normal. Head is normocephalic and atraumatic. PERRLA, EOMI. Oropharynx is clear. Neck is nontender and supple without adenopathy or JVD. Back is nontender and there is no CVA tenderness. Lungs are clear without rales, wheezes, or rhonchi. Chest is nontender. Heart has regular rate and rhythm without murmur. Abdomen is soft, flat, nontender without masses or hepatosplenomegaly and peristalsis is normoactive. Extremities have no cyanosis or edema, full range of motion is present. Skin is warm and dry without rash. Neurologic: Mental status is normal, cranial nerves  are intact, there are no motor or sensory deficits.  ED Results / Procedures / Treatments   Labs (all labs ordered are listed, but only abnormal results are displayed) Labs Reviewed  CBC - Abnormal; Notable for the following components:      Result Value   WBC 11.1 (*)    All other components within normal limits  BASIC METABOLIC PANEL  TROPONIN I (HIGH SENSITIVITY)    EKG EKG Interpretation  Date/Time:  Monday July 01 2020 05:55:14 EDT Ventricular Rate:  89 PR Interval:    QRS Duration: 87 QT Interval:  381 QTC Calculation: 464 R Axis:   -47 Text Interpretation: Sinus rhythm Probable left atrial enlargement LAD, consider left anterior fascicular block Borderline T abnormalities, inferior leads When compared with ECG  of 05/15/2020, Q waves in inferior leads are less prominent Confirmed by Delora Fuel ([id number]) on 07/01/2020 6:12:52 AM   Radiology DG Chest 2 View  Result Date: 07/01/2020 CLINICAL DATA:  Chest pain EXAM: CHEST - 2 VIEW COMPARISON:  08/12/2019 chest CT FINDINGS: Normal heart size and mediastinal contours. There is no edema, consolidation, effusion, or pneumothorax. Artifact from EKG leads and piercings. IMPRESSION: No evidence of active disease. Electronically Signed   By: Monte Fantasia M.D.   On: 07/01/2020 06:42    Procedures Procedures   Medications Ordered in ED Medications - No data to display  ED Course  I have reviewed the triage vital signs and the nursing notes.  Pertinent labs & imaging results that were available during my care of the patient were reviewed by me and considered in my medical decision making (see chart for details).  MDM Rules/Calculators/A&P Chest discomfort which most likely is an episode of angina pectoris.  Old records are reviewed, and he had been hospitalized with a non-STEMI 1 year ago and was found to have distal disease not amenable to PCI or bypass with recommendation for intensive medical management.  Unfortunately,  he has been noncompliant with his medication regimen.  Today, ECG shows no acute ST or T changes, inferior Q waves are less prominent than previously.  Chest x-ray shows no acute disease.  Troponin has been ordered and will need to have delta troponin.  If negative, he can be discharged with cardiology follow-up.  Case is signed out to Dr. Zenia Resides.  Final Clinical Impression(s) / ED Diagnoses Final diagnoses:  Angina pectoris ([place])  Elevated blood pressure reading with diagnosis of hypertension  Noncompliance with medication regimen    Rx / DC Orders ED Discharge Orders    None       Delora Fuel, MD 76/72/09 0725

## 2020-07-10 ENCOUNTER — Ambulatory Visit (INDEPENDENT_AMBULATORY_CARE_PROVIDER_SITE_OTHER): Payer: No Typology Code available for payment source | Admitting: Nurse Practitioner

## 2020-07-10 ENCOUNTER — Other Ambulatory Visit: Payer: Self-pay

## 2020-07-10 ENCOUNTER — Encounter: Payer: Self-pay | Admitting: Nurse Practitioner

## 2020-07-10 VITALS — BP 128/94 | HR 59 | Ht 67.0 in | Wt 249.4 lb

## 2020-07-10 DIAGNOSIS — I2510 Atherosclerotic heart disease of native coronary artery without angina pectoris: Secondary | ICD-10-CM

## 2020-07-10 DIAGNOSIS — I10 Essential (primary) hypertension: Secondary | ICD-10-CM

## 2020-07-10 DIAGNOSIS — N182 Chronic kidney disease, stage 2 (mild): Secondary | ICD-10-CM

## 2020-07-10 DIAGNOSIS — Z720 Tobacco use: Secondary | ICD-10-CM

## 2020-07-10 DIAGNOSIS — G4733 Obstructive sleep apnea (adult) (pediatric): Secondary | ICD-10-CM

## 2020-07-10 MED ORDER — AMLODIPINE BESYLATE 5 MG PO TABS
5.0000 mg | ORAL_TABLET | Freq: Every day | ORAL | 5 refills | Status: DC
Start: 2020-07-10 — End: 2022-01-23

## 2020-07-10 MED ORDER — CARVEDILOL 25 MG PO TABS: 25.0000 mg | ORAL_TABLET | Freq: Two times a day (BID) | ORAL | 5 refills | Status: DC

## 2020-07-10 NOTE — Patient Instructions (Signed)
Medication Instructions:   Your physician has recommended you make the following change in your medication:   1)  STOP taking your Plavix. 2)  DECREASE your amLODipine (NORVASC) to 5 MG daily.   *If you need a refill on your cardiac medications before your next appointment, please call your pharmacy*   Lab Work: None Ordered If you have labs (blood work) drawn today and your tests are completely normal, you will receive your results only by: Marland Kitchen MyChart Message (if you have MyChart) OR . A paper copy in the mail If you have any lab test that is abnormal or we need to change your treatment, we will call you to review the results.   Testing/Procedures: None Ordered   Follow-Up: At [facility], you and your health needs are our priority.  As part of our continuing mission to provide you with exceptional heart care, we have created designated Provider Care Teams.  These Care Teams include your primary Cardiologist (physician) and Advanced Practice Providers (APPs -  Physician Assistants and Nurse Practitioners) who all work together to provide you with the care you need, when you need it.  We recommend signing up for the patient portal called "MyChart".  Sign up information is provided on this After Visit Summary.  MyChart is used to connect with patients for Virtual Visits (Telemedicine).  Patients are able to view lab/test results, encounter notes, upcoming appointments, etc.  Non-urgent messages can be sent to your provider as well.   To learn more about what you can do with MyChart, go to [email or web address].    Your next appointment:   3 month(s)  The format for your next appointment:   In Person  Provider:   Yvonne Kendall, MD   Other Instructions

## 2020-07-10 NOTE — Progress Notes (Signed)
Office Visit    Patient Name: Marcus Bauer Date of Encounter: 07/10/2020  Primary Care Provider:  Dion Body, MD Primary Cardiologist:  Nelva Bush, MD  Chief Complaint    39 y/o ? w/ a h/o CAD s/p NSTEMI 07/2019, HTN, OSA, GERD, asthma, and tob abuse, who presents for f/u after recent ED visit for chest pain.  Past Medical History    Past Medical History:  Diagnosis Date  . (HFpEF) heart failure with preserved ejection fraction ([place])    a 07/2019 Echo: EF 60-65%. No signif valvular dzs; b. 07/2019 Cath: LVEDP 30-87mHg.  .Marland KitchenAnxiety   . Asthma   . CAD (coronary artery disease)    a. 07/2019 NSTEMI/Cath: LM nl, LAD nl, D1/2/3 nl, LCX nl, OM1 60, OM2/3 nl, RCA large, nl, RPDA 100 w/ L->R collats. EF 50-55%.  .Marland KitchenGERD (gastroesophageal reflux disease)   . Hypertension   . Sleep apnea    a. CPAP noncompliance.   Past Surgical History:  Procedure Laterality Date  . CARPOMETACARPAL ([place] FUSION OF THUMB Left 12/20/2018   Procedure: REPAIR OF THE ULNAR COLLATERAL LIGAMENT OF THUMB;  Surgeon: PCorky Mull MD;  Location: [facility] ORS;  Service: Orthopedics;  Laterality: Left;  ARTHREX SMALL JOINT INTERNAL BRACE KIT  . LEFT HEART CATH AND CORONARY ANGIOGRAPHY N/A 08/13/2019   Procedure: LEFT HEART CATH AND CORONARY ANGIOGRAPHY;  Surgeon: ENelva Bush MD;  [facility];  Service: Cardiovascular;  Laterality: N/A;  . SHOULDER SURGERY Left     Allergies  Allergies  Allergen Reactions  . Shellfish Allergy Shortness Of Breath  . Hydrochlorothiazide Other (See Comments)    Intolerance - malaise  . Other Hives    CLOROX    History of Present Illness    39y/o ? w/ the above PMH including CAD s/p NSTEMI 07/2019, HTN, OSA, GERD, asthma, and tob abuse.  [facility] chest pain and subtle inf ST elevation that did not meet STEMI criteria.  In the setting of ongoing c/p, he underwent emergent cath revealing an occlusion of  the distal RPDA, which was too small for PCI, along w/ a 60% ostial OM1 stenosis.  EF = 50-55%.  F/u echo showed nl EF @ 60-65%.  He was last seen in 09/2019 at which time he was doing reasonably well but required titration of amlodipine to 139mdaily in the setting of ongoing HTN.    Unfortunately, he required ED eval on 9/13 secondary to chest pain and palpitations.  He reported having stopped all of his cardiac meds at some point after his 09/2019 visit.  ECG showed old inferior MI w/o acute changes and initial HsTrop was elevated @ 27 but subsequent values were nl (13  13).  He was seen by our team @ [facility] in consultation.  Given reassuring HsTroponin trend, he was d/c'd home with recommendation to resume prior home meds.  Since his ED visit he reports doing well overall. He does report continued stress and anxiety, which he discussed with his PCP, who started him on lexapro. He has not yet filled this prescription. Additionally, he is not taking his amlodipine, atorvastatin, or plavix. He has difficulty recalling his home medications. He reports a decreased appetite, which he thinks is related to anxiety, he continues to smoke 0.5 ppd and does not use his CPAP regularly. He states he is active, and runs on trails weekly. His blood pressure has been elevated at  home, with readings as high as 190/50, prior to resuming his carvedilol and losartan. He reports mild swelling in his legs after working 12 hours. Otherwise, he denies chest pain, shortness of breath, orthopnea, n,v, dizziness, syncope, weight gain or early satiety.    Home Medications    Prior to Admission medications   Medication Sig Start Date End Date Taking? Authorizing Provider  escitalopram (LEXAPRO) 10 MG tablet Take by mouth. 07/05/20 10/03/20 Yes [provider]  albuterol (PROVENTIL HFA;VENTOLIN HFA) 108 (90 Base) MCG/ACT inhaler Inhale 2 puffs into the lungs every 6 (six) hours as needed for wheezing or shortness of breath.     [provider]  amLODipine (NORVASC) 10 MG tablet Take 1 tablet (10 mg total) by mouth daily. 09/27/19 07/01/20  Theora Gianotti, NP  aspirin EC 81 MG EC tablet Take 1 tablet (81 mg total) by mouth daily. Patient not taking: Reported on 07/01/2020 08/16/19   Dustin Flock, MD  atorvastatin (LIPITOR) 80 MG tablet Take 1 tablet (80 mg total) by mouth daily at 6 PM. 08/15/19   Dustin Flock, MD  carvedilol (COREG) 25 MG tablet Take 1 tablet (25 mg total) by mouth 2 (two) times daily. 08/15/19 08/14/20  Dustin Flock, MD  clopidogrel (PLAVIX) 75 MG tablet Take 1 tablet (75 mg total) by mouth daily with breakfast. Patient not taking: Reported on 07/01/2020 08/16/19   Dustin Flock, MD  losartan (COZAAR) 50 MG tablet Take 2 tablets (100 mg total) by mouth daily. Patient not taking: Reported on 07/01/2020 08/15/19 08/14/20  Dustin Flock, MD  nitroGLYCERIN (NITROSTAT) 0.4 MG SL tablet Place 1 tablet (0.4 mg total) under the tongue every 5 (five) minutes x 3 doses as needed for chest pain. 09/08/19   End, Harrell Gave, MD    Review of Systems   Since his ED visit, he denies chest pain, palpitations, dyspnea, pnd, orthopnea, n, v, dizziness, syncope, edema, weight gain, or early satiety. All other systems reviewed and are otherwise negative except as noted above.  Physical Exam    VS:  BP 128/94 , HR 59, SpO2 98% on RA, Wt. 249.4 lbs. GEN: Well nourished, well developed, in no acute distress. HEENT: normal. Neck: Supple, no JVD, carotid bruits, or masses. Cardiac: RRR, no murmurs, rubs, or gallops. No clubbing, cyanosis, trace bilateral lower extremity edema. DP 2+ and equal bilaterally.  Respiratory:  Respirations regular and unlabored, clear to auscultation bilaterally. GI: Soft, nontender, nondistended, BS + x 4. MS: no deformity or atrophy. Skin: warm and dry, no rash. Neuro:  Strength and sensation are intact. Psych: Normal affect.  Accessory Clinical Findings      ECG personally reviewed by me today - Sinus bradycardia, 59, ? LAE, prior inf infarct- no acute changes.  Lab Results  Component Value Date   WBC 11.1 (H) 07/01/2020   HGB 14.8 07/01/2020   HCT 44.5 07/01/2020   MCV 86.9 07/01/2020   PLT 228 07/01/2020   Lab Results  Component Value Date   CREATININE 1.54 (H) 07/01/2020   BUN 7 07/01/2020   NA 138 07/01/2020   K 3.5 07/01/2020   CL 102 07/01/2020   CO2 27 07/01/2020   Lab Results  Component Value Date   ALT 23 05/15/2020   AST 24 05/15/2020   ALKPHOS 48 05/15/2020   BILITOT 1.1 05/15/2020   Lab Results  Component Value Date   CHOL 139 08/13/2019   HDL 42 08/13/2019   LDLCALC 73 08/13/2019   TRIG 118 08/13/2019  CHOLHDL 3.3 08/13/2019    Lab Results  Component Value Date   HGBA1C 5.6 08/12/2019    Assessment & Plan    1.  CAD/NSTEMI: Stable post ED visit with resumed medical therapy, though he has not been taking all of his medication as prescribed, despite his PCP recently sending refills of all of his medications (except amlodipine). He remains asymptomatic. Discussed medication adherence, and stressed the importance of blood pressure control, full smoking cessation, and adherence to CPAP. As he did not previously undergo stent placement and as he has not been taking plavix, no need to resume plavix at this time. Will continue medical management for now with  ASA 81 mg, lipitor 80 mg, carvedilol 25 mg bid, and losartan 100 mg daily. If symptoms return or persist, given known occulusion to the RPDA and moderate OM dzs,  would consider possible outpatient stress test to evaluate for possible worsening ischemia.   2. Hypertension: BP elevated in office today, 128/94 (140/100 on repeat), despite taking his morning medication. He has not been taking amlodipine 10 mg daily as prescribed, and has not taken this in several months. He is currently taking the max dose of losartan and carvedilol. Recommend starting amlodipine 5 mg  daily with the option to increase dose to 10 mg is his blood pressure does not improve. I encouraged him to check his blood pressure a couple of times/week and report consistent readings > 130/90. Need to consider history of CKD II if need for additional agent arises.   3. Hyperlipidemia: Lipid profile in 07/2019 revealed an LDL of 73, HDL of 42, and a total cholesterol of 139. Continue high intensity statin. Encouraged diet, exercise, and full tobacco cessation.   4. Tobacco abuse: He is smoking 0.5 ppd down from 1 ppd. He has no plan for quitting other than "reducing stress." Encouraged him to choose a quit date. Will continue to advise full cessation.   5. OSA: He last wore his CPAP one week ago, reports sporadic use. Discussed risk of non-adherence to CPAP, encouraged regular use.   6. Anxiety: He thinks that this may have contributed to his recent chest pain and describes feeling like he is having "panic attacks," though he cannot identify specific stressors. His PCP recently prescribed Lexapro, which he has not taken. Encouraged him to take this as prescribed, and follow-up with his PCP as needed.    7. CKD II:  Cont losartan.  8.  Disposition: Start Amlodipine 5 mg daily for management of hypertension, prescription sent today. He will continue to monitor his blood pressure at home. Continue medical therapy for CAD, follow-up in 3 months or sooner if symptoms return/continue.   Murray Hodgkins, NP 07/10/2020, 3:31 PM

## 2020-10-15 NOTE — Progress Notes (Deleted)
Follow-up Outpatient Visit Date: 10/16/2020  Primary Care Provider: Dion Body, MD [place] [facility] [place] [address]  Chief Complaint: ***  HPI:  Mr. Slivinski is a 39 y.o. male with history of coronary artery disease status post NSTEMI (07/2019; medically managed), hypertension, OSA, GERD, asthma, and tobacco abuse, who presents for follow-up of coronary artery disease.  He was last seen in our office by Ignacia Bayley, NP, in September.  Been seen in the emergency department earlier that month after developing palpitations and chest pain in the setting of having stopped all of his cardiac medications several months prior.  Amlodipine was increased at his last visit due to suboptimal blood pressure control.  --------------------------------------------------------------------------------------------------  Past Medical History:  Diagnosis Date  . (HFpEF) heart failure with preserved ejection fraction ([place])    a 07/2019 Echo: EF 60-65%. No signif valvular dzs; b. 07/2019 Cath: LVEDP 30-68mHg.  .Marland KitchenAnxiety   . Asthma   . CAD (coronary artery disease)    a. 07/2019 NSTEMI/Cath: LM nl, LAD nl, D1/2/3 nl, LCX nl, OM1 60, OM2/3 nl, RCA large, nl, RPDA 100 w/ L->R collats. EF 50-55%.  .Marland KitchenGERD (gastroesophageal reflux disease)   . Hypertension   . Sleep apnea    a. CPAP noncompliance.   Past Surgical History:  Procedure Laterality Date  . CARPOMETACARPAL ([place] FUSION OF TH[place]B Left 12/20/2018   Procedure: REPAIR OF THE ULNAR COLLATERAL LIGAMENT OF THUMB;  Surgeon: PCorky Mull MD;  Location: [facility] ORS;  Service: Orthopedics;  Laterality: Left;  ARTHREX SMALL JOINT INTERNAL BRACE KIT  . LEFT HEART CATH AND CORONARY ANGIOGRAPHY N/A 08/13/2019   Procedure: LEFT HEART CATH AND CORONARY ANGIOGRAPHY;  Surgeon: ENelva Bush MD;  [facility];  Service: Cardiovascular;  Laterality: N/A;  . SHOULDER SURGERY Left     No outpatient medications  have been marked as taking for the 10/16/20 encounter (Appointment) with Konner Saiz, CHarrell Gave MD.    Allergies: Shellfish allergy, Hydrochlorothiazide, and Other  Social History   Tobacco Use  . Smoking status: Current Every Day Smoker    Packs/day: 0.50    Years: 18.00    Pack years: 9.00    Types: Cigarettes  . Smokeless tobacco: Current User    Types: Chew  . Tobacco comment: down to 1/4 pack a day 08/25/2019  Vaping Use  . Vaping Use: Never used  Substance Use Topics  . Alcohol use: Yes    Comment: OCC-weekends, more than 6/day  . Drug use: Never    Family History  Problem Relation Age of Onset  . Heart disease Mother   . Hypertension Mother   . Hyperlipidemia Mother   . Hypertension Father   . Hyperlipidemia Father     Review of Systems: A 12-system review of systems was performed and was negative except as noted in the HPI.  --------------------------------------------------------------------------------------------------  Physical Exam: There were no vitals taken for this visit.  General:  *** HEENT: No conjunctival pallor or scleral icterus. Facemask in place. Neck: Supple without lymphadenopathy, thyromegaly, JVD, or HJR. Lungs: Normal work of breathing. Clear to auscultation bilaterally without wheezes or crackles. Heart: Regular rate and rhythm without murmurs, rubs, or gallops. Non-displaced PMI. Abd: Bowel sounds present. Soft, NT/ND without hepatosplenomegaly Ext: No lower extremity edema. Radial, PT, and DP pulses are 2+ bilaterally. Skin: Warm and dry without rash.  EKG:  ***  Lab Results  Component Value Date   WBC 11.1 (H) 07/01/2020   HGB  14.8 07/01/2020   HCT 44.5 07/01/2020   MCV 86.9 07/01/2020   PLT 228 07/01/2020    Lab Results  Component Value Date   NA 138 07/01/2020   K 3.5 07/01/2020   CL 102 07/01/2020   CO2 27 07/01/2020   BUN 7 07/01/2020   CREATININE 1.54 (H) 07/01/2020   GLUCOSE 95 07/01/2020   ALT 23 05/15/2020     Lab Results  Component Value Date   CHOL 139 08/13/2019   HDL 42 08/13/2019   LDLCALC 73 08/13/2019   TRIG 118 08/13/2019   CHOLHDL 3.3 08/13/2019    --------------------------------------------------------------------------------------------------  ASSESSMENT AND PLAN: Nelva Bush, MD 10/15/2020 6:19 PM

## 2020-10-16 ENCOUNTER — Ambulatory Visit: Payer: No Typology Code available for payment source | Admitting: Internal Medicine

## 2021-02-11 ENCOUNTER — Encounter: Payer: Self-pay | Admitting: Emergency Medicine

## 2021-02-11 ENCOUNTER — Emergency Department
Admission: EM | Admit: 2021-02-11 | Discharge: 2021-02-11 | Disposition: A | Discharge status: Home / Self Care | Payer: No Typology Code available for payment source | Attending: Emergency Medicine | Admitting: Emergency Medicine

## 2021-02-11 ENCOUNTER — Other Ambulatory Visit: Payer: Self-pay

## 2021-02-11 DIAGNOSIS — Z79899 Other long term (current) drug therapy: Secondary | ICD-10-CM | POA: Insufficient documentation

## 2021-02-11 DIAGNOSIS — I509 Heart failure, unspecified: Secondary | ICD-10-CM | POA: Insufficient documentation

## 2021-02-11 DIAGNOSIS — I25119 Atherosclerotic heart disease of native coronary artery with unspecified angina pectoris: Secondary | ICD-10-CM | POA: Insufficient documentation

## 2021-02-11 DIAGNOSIS — K0381 Cracked tooth: Secondary | ICD-10-CM | POA: Insufficient documentation

## 2021-02-11 DIAGNOSIS — Z7982 Long term (current) use of aspirin: Secondary | ICD-10-CM | POA: Insufficient documentation

## 2021-02-11 DIAGNOSIS — K0889 Other specified disorders of teeth and supporting structures: Secondary | ICD-10-CM

## 2021-02-11 DIAGNOSIS — F17210 Nicotine dependence, cigarettes, uncomplicated: Secondary | ICD-10-CM | POA: Insufficient documentation

## 2021-02-11 DIAGNOSIS — Z955 Presence of coronary angioplasty implant and graft: Secondary | ICD-10-CM | POA: Insufficient documentation

## 2021-02-11 DIAGNOSIS — I110 Hypertensive heart disease with heart failure: Secondary | ICD-10-CM | POA: Insufficient documentation

## 2021-02-11 DIAGNOSIS — S025XXB Fracture of tooth (traumatic), initial encounter for open fracture: Secondary | ICD-10-CM

## 2021-02-11 DIAGNOSIS — J45909 Unspecified asthma, uncomplicated: Secondary | ICD-10-CM | POA: Insufficient documentation

## 2021-02-11 MED ORDER — NAPROXEN 500 MG PO TABS: 500.0000 mg | ORAL_TABLET | Freq: Two times a day (BID) | ORAL | 0 refills | Status: DC

## 2021-02-11 MED ORDER — TRAMADOL HCL 50 MG PO TABS: 50.0000 mg | ORAL_TABLET | Freq: Four times a day (QID) | ORAL | 0 refills | Status: DC | PRN

## 2021-02-11 MED ORDER — AMOXICILLIN 875 MG PO TABS: 875.0000 mg | ORAL_TABLET | Freq: Two times a day (BID) | ORAL | 0 refills | Status: DC

## 2021-02-11 MED ORDER — OXYCODONE-ACETAMINOPHEN 5-325 MG PO TABS
1.0000 | ORAL_TABLET | Freq: Once | ORAL | Status: AC
Start: 2021-02-11 — End: 2021-02-11
  Administered 2021-02-11: 1 via ORAL
  Filled 2021-02-11: qty 1

## 2021-02-11 NOTE — ED Triage Notes (Signed)
Patient ambulatory to triage with steady gait, without difficulty or distress noted; pt reports rt lower dental pain x 2 days

## 2021-02-11 NOTE — ED Provider Notes (Signed)
[facility] Emergency Department Provider Note  ____________________________________________  Time seen: Approximately 4:46 AM  I have reviewed the triage vital signs and the nursing notes.   HISTORY  Chief Complaint Dental Pain    HPI Marcus Bauer is a 40 y.o. male with a past history of CAD hypertension GERD heart failure and obesity who comes ED complaining of right lower dental pain for the past 2 days, gradual onset worsening, waxing and waning, no aggravating or alleviating factors.  Reports it is severe.  No fevers chills or difficulty swallowing or breathing.  Voice is normal.  Normal mouth opening.   Patient sees Dr. Netty Starring for PCP.  Notes that he does not take any of his medications and his continued smoking.   Past Medical History:  Diagnosis Date  . (HFpEF) heart failure with preserved ejection fraction ([place])    a 07/2019 Echo: EF 60-65%. No signif valvular dzs; b. 07/2019 Cath: LVEDP 30-35mHg.  .Marland KitchenAnxiety   . Asthma   . CAD (coronary artery disease)    a. 07/2019 NSTEMI/Cath: LM nl, LAD nl, D1/2/3 nl, LCX nl, OM1 60, OM2/3 nl, RCA large, nl, RPDA 100 w/ L->R collats. EF 50-55%.  .Marland KitchenGERD (gastroesophageal reflux disease)   . Hypertension   . Sleep apnea    a. CPAP noncompliance.     Patient Active Problem List   Diagnosis Date Noted  . Asthma without status asthmaticus 06/25/2020  . OSA on CPAP 06/25/2020  . Essential hypertension 09/09/2019  . Morbid obesity ([place] 09/09/2019  . Coronary artery disease of native artery of native heart with stable angina pectoris ([place] 08/23/2019  . GAD (generalized anxiety disorder) 08/23/2019  . Non-ST elevation (NSTEMI) myocardial infarction ([place]   . Chest pain with high risk of acute coronary syndrome 08/12/2019     Past Surgical History:  Procedure Laterality Date  . CARPOMETACARPAL ([place] FUSION OF THUMB Left 12/20/2018   Procedure: REPAIR OF THE ULNAR COLLATERAL LIGAMENT OF THUMB;   Surgeon: PCorky Mull MD;  Location: [facility] ORS;  Service: Orthopedics;  Laterality: Left;  ARTHREX SMALL JOINT INTERNAL BRACE KIT  . LEFT HEART CATH AND CORONARY ANGIOGRAPHY N/A 08/13/2019   Procedure: LEFT HEART CATH AND CORONARY ANGIOGRAPHY;  Surgeon: ENelva Bush MD;  [facility];  Service: Cardiovascular;  Laterality: N/A;  . SHOULDER SURGERY Left      Prior to Admission medications   Medication Sig Start Date End Date Taking? Authorizing Provider  amoxicillin (AMOXIL) 875 MG tablet Take 1 tablet (875 mg total) by mouth 2 (two) times daily. 02/11/21  Yes SCarrie Mew MD  naproxen (NAPROSYN) 500 MG tablet Take 1 tablet (500 mg total) by mouth 2 (two) times daily with a meal. 02/11/21  Yes SCarrie Mew MD  traMADol (ULTRAM) 50 MG tablet Take 1 tablet (50 mg total) by mouth every 6 (six) hours as needed for severe pain. 02/11/21  Yes SCarrie Mew MD  albuterol (PROVENTIL HFA;VENTOLIN HFA) 108 (90 Base) MCG/ACT inhaler Inhale 2 puffs into the lungs every 6 (six) hours as needed for wheezing or shortness of breath.    [provider]  amLODipine (NORVASC) 5 MG tablet Take 1 tablet (5 mg total) by mouth daily. 07/10/20 10/08/20  BTheora Gianotti NP  aspirin EC 81 MG EC tablet Take 1 tablet (81 mg total) by mouth daily. 08/16/19   PDustin Flock MD  atorvastatin (LIPITOR) 80 MG tablet Take 1 tablet (80 mg total) by mouth daily at  6 PM. 08/15/19   Dustin Flock, MD  carvedilol (COREG) 25 MG tablet Take 1 tablet (25 mg total) by mouth 2 (two) times daily. 07/10/20 07/10/21  Theora Gianotti, NP  losartan (COZAAR) 50 MG tablet Take 2 tablets (100 mg total) by mouth daily. 08/15/19 08/14/20  Dustin Flock, MD  nitroGLYCERIN (NITROSTAT) 0.4 MG SL tablet Place 1 tablet (0.4 mg total) under the tongue every 5 (five) minutes x 3 doses as needed for chest pain. 09/08/19   End, Harrell Gave, MD     Allergies Shellfish allergy,  Hydrochlorothiazide, and Other   Family History  Problem Relation Age of Onset  . Heart disease Mother   . Hypertension Mother   . Hyperlipidemia Mother   . Hypertension Father   . Hyperlipidemia Father     Social History Social History   Tobacco Use  . Smoking status: Current Every Day Smoker    Packs/day: 0.50    Years: 18.00    Pack years: 9.00    Types: Cigarettes  . Smokeless tobacco: Current User    Types: Chew  . Tobacco comment: down to 1/4 pack a day 08/25/2019  Vaping Use  . Vaping Use: Never used  Substance Use Topics  . Alcohol use: Yes    Comment: OCC-weekends, more than 6/day  . Drug use: Never    Review of Systems  Constitutional:   No fever or chills.  ENT:   No sore throat. No rhinorrhea.  Dental pain as above Cardiovascular:   No chest pain or syncope. Respiratory:   No dyspnea or cough. Gastrointestinal:   Negative for abdominal pain, vomiting and diarrhea.  Musculoskeletal:   Negative for focal pain or swelling All other systems reviewed and are negative except as documented above in ROS and HPI.  ____________________________________________   PHYSICAL EXAM:  VITAL SIGNS: ED Triage Vitals  Enc Vitals Group     BP 02/11/21 0349 (!) 142/97     Pulse Rate 02/11/21 0349 99     Resp 02/11/21 0349 18     Temp 02/11/21 0349 98.7 F (37.1 C)     Temp Source 02/11/21 0349 Oral     SpO2 02/11/21 0349 95 %     Weight 02/11/21 0342 230 lb (104.3 kg)     Height 02/11/21 0342 _0  (1.702 m)     Head Circumference --      Peak Flow --      Pain Score 02/11/21 0342 8     Pain Loc --      Pain Edu? --      Excl. in [place]? --     Vital signs reviewed, nursing assessments reviewed.   Constitutional:   Alert and oriented. Non-toxic appearance. Eyes:   Conjunctivae are normal. EOMI. ENT      Head:   Normocephalic and atraumatic.      Mouth/Throat:   MMM.  Right lower back molar with a small fracture from the bite surface edge.  There is some mild  gingival swelling without fluctuance or purulent drainage.  No trismus.  No asymmetric tonsillar swelling or uvular deviation.  Floor the mouth is soft without tongue elevation.      Neck:   No meningismus. Full ROM.  No mass or fullness Hematological/Lymphatic/Immunilogical:   No cervical lymphadenopathy. Cardiovascular:   RRR. Symmetric bilateral radial and DP pulses.  No murmurs. Cap refill less than 2 seconds. Respiratory:   Normal respiratory effort without tachypnea/retractions. Breath sounds are clear and equal bilaterally.  No wheezes/rales/rhonchi. Gastrointestinal:   Soft and nontender. Non distended. There is no CVA tenderness.  No rebound, rigidity, or guarding. Musculoskeletal:   Normal range of motion in all extremities.  Neurologic:   Normal speech and language.  Motor grossly intact. No acute focal neurologic deficits are appreciated.  ____________________________________________    LABS (pertinent positives/negatives) (all labs ordered are listed, but only abnormal results are displayed) Labs Reviewed - No data to display ____________________________________________   EKG  ____________________________________________    RADIOLOGY  No results found.  ____________________________________________   PROCEDURES Procedures  ____________________________________________  CLINICAL IMPRESSION / ASSESSMENT AND PLAN / ED COURSE  Pertinent labs & imaging results that were available during my care of the patient were reviewed by me and considered in my medical decision making (see chart for details).  Marcus Bauer was evaluated in Emergency Department on 02/11/2021 for the symptoms described in the history of present illness. He was evaluated in the context of the global COVID-19 pandemic, which necessitated consideration that the patient might be at risk for infection with the SARS-CoV-2 virus that causes COVID-19. Institutional protocols and algorithms that pertain to  the evaluation of patients at risk for COVID-19 are in a state of rapid change based on information released by regulatory bodies including the CDC and federal and state organizations. These policies and algorithms were followed during the patient's care in the ED.   Patient presents with dental pain, appears to be arising from a fractured molar.  Some evidence of developing odontogenic infection without abscess.  No evidence of spreading facial cellulitis or abscess.  Will prescribe NSAIDs, amoxicillin, very limited prescription of tramadol.  Recommend follow-up with dentistry for definitive care.  Counseled patient on taking his medications and smoking cessation.      ____________________________________________   FINAL CLINICAL IMPRESSION(S) / ED DIAGNOSES    Final diagnoses:  Pain, dental  Open fracture of tooth, initial encounter     ED Discharge Orders         Ordered    naproxen (NAPROSYN) 500 MG tablet  2 times daily with meals        02/11/21 0436    traMADol (ULTRAM) 50 MG tablet  Every 6 hours PRN        02/11/21 0436    amoxicillin (AMOXIL) 875 MG tablet  2 times daily        02/11/21 0437          Portions of this note were generated with dragon dictation software. Dictation errors may occur despite best attempts at proofreading.   Carrie Mew, MD 02/11/21 [phone]

## 2021-03-02 IMAGING — CT CT ANGIO CHEST
2 of 6 series · 19 of 46 positions shown · IV contrast (APPLIED)
Comparison: Chest CT dated 07/19/2009 and radiograph dated
08/12/2019

CLINICAL DATA: 37-year-old male with chest pain.

EXAM:
CT ANGIOGRAPHY CHEST WITH CONTRAST
TECHNIQUE: Multidetector CT imaging of the chest was performed using the
standard protocol during bolus administration of intravenous
contrast. Multiplanar CT image reconstructions and MIPs were
obtained to evaluate the vascular anatomy.
CONTRAST:  75mL OMNIPAQUE IOHEXOL 350 MG/ML SOLN

[Series 5: thins · axial · 0.73mm/px · z∈[-749,-501]mm · 17 of 272 slices shown]
[im 12/272  lung]
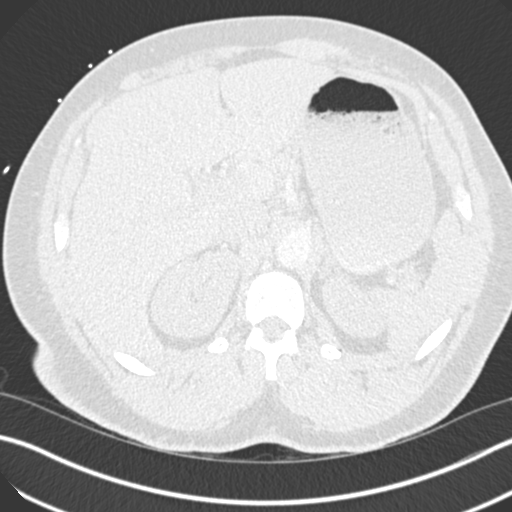
[im 24/272  soft-tissue]
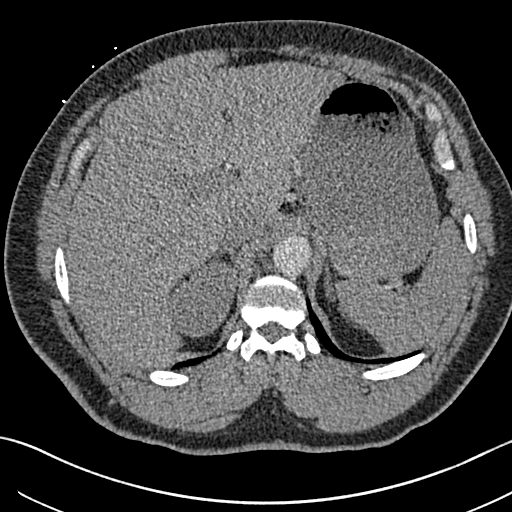
[im 48/272  lung]
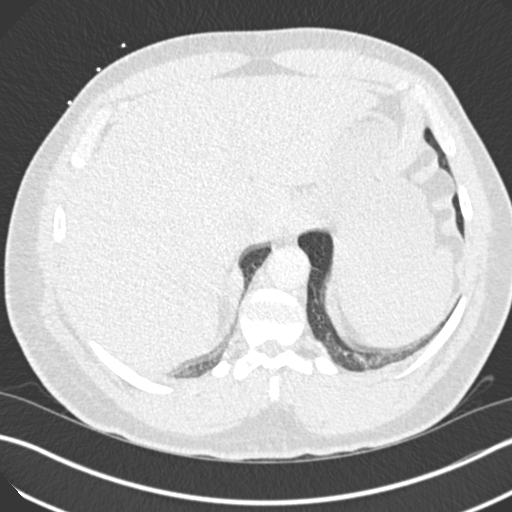
[im 59/272  soft-tissue]
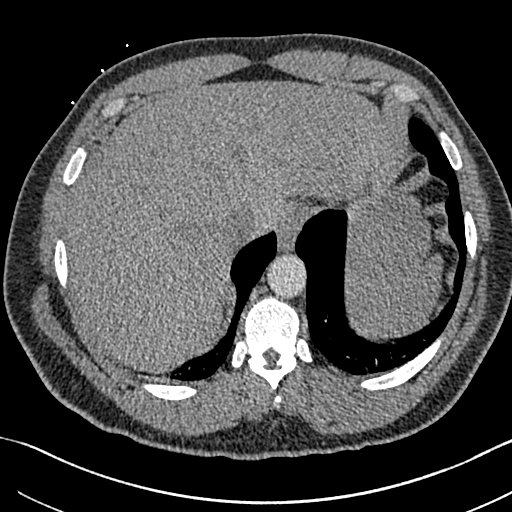
[im 71/272  lung]
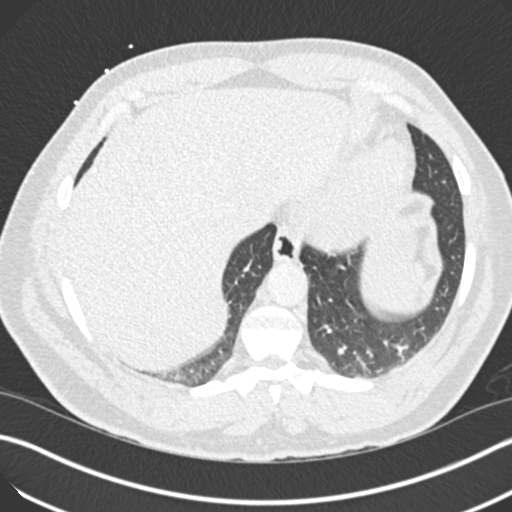
[im 95/272  soft-tissue]
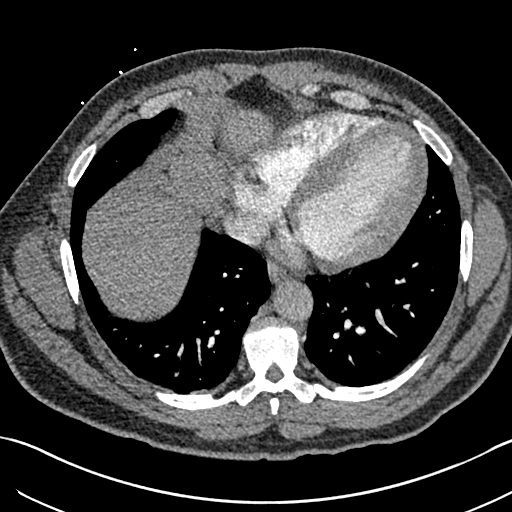
[im 107/272  lung]
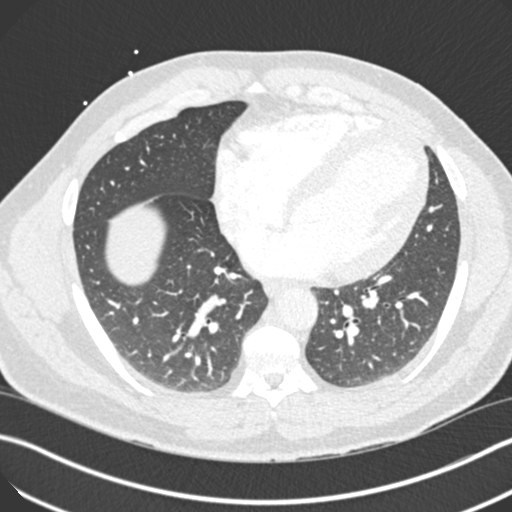
[im 118/272  soft-tissue]
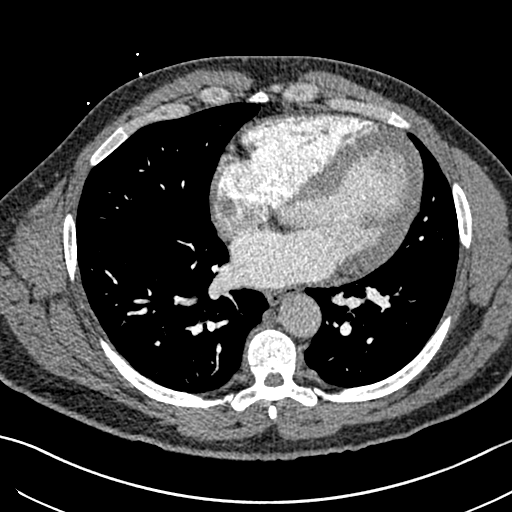
[im 142/272  lung]
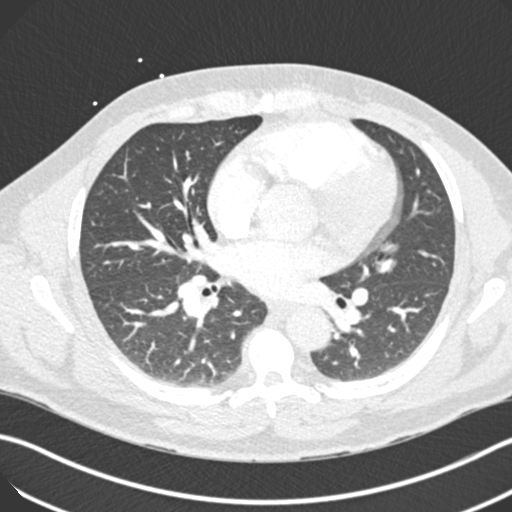
[im 154/272  soft-tissue]
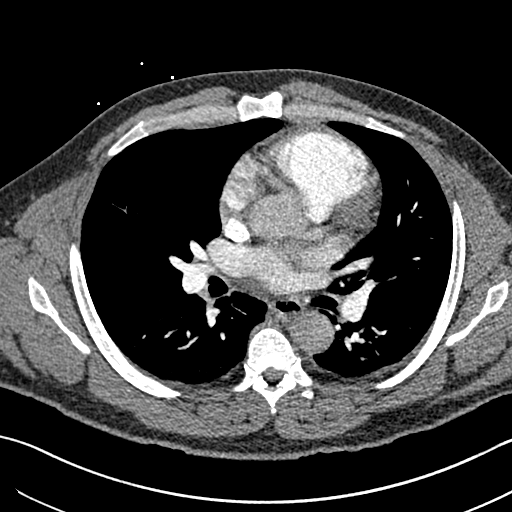
[im 165/272  lung]
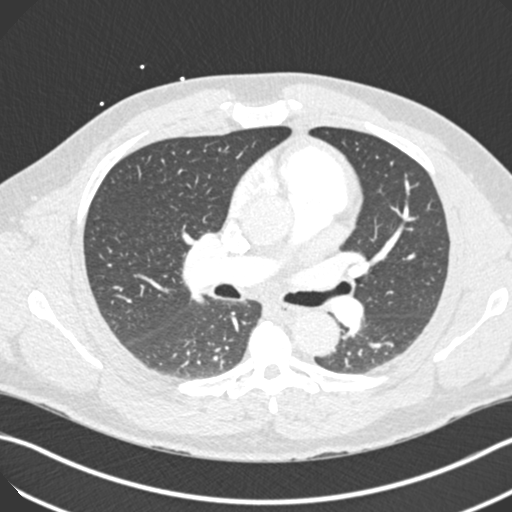
[im 177/272  soft-tissue]
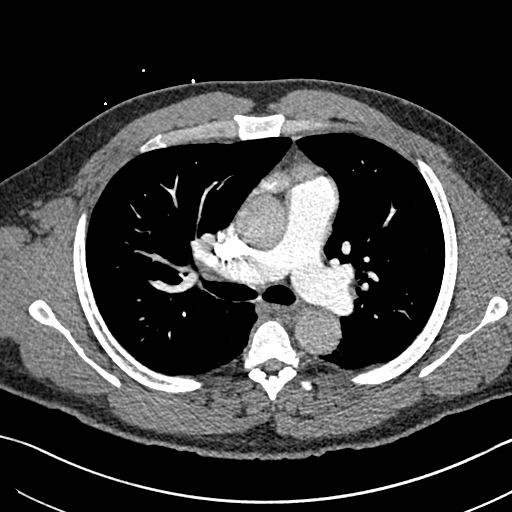
[im 201/272  lung]
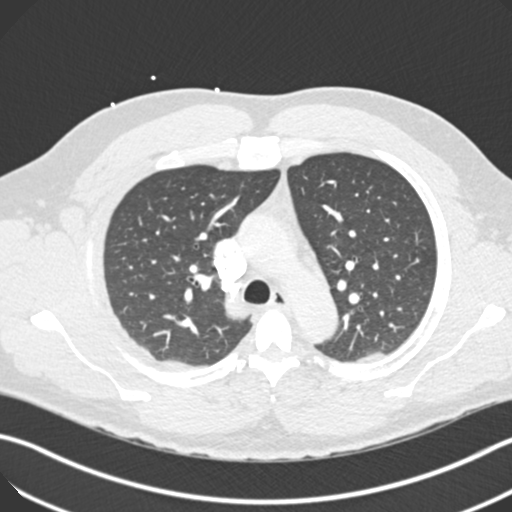
[im 213/272  soft-tissue]
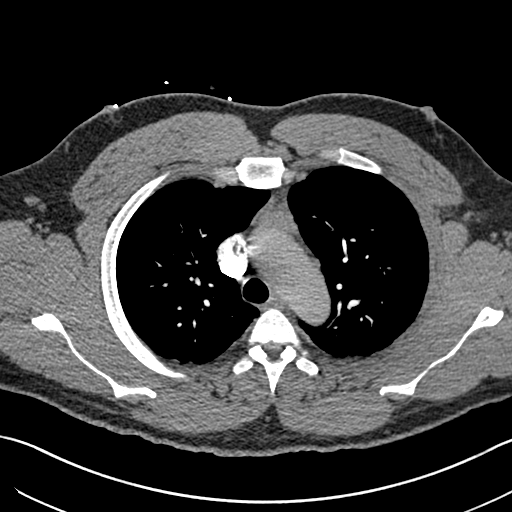
[im 224/272  lung]
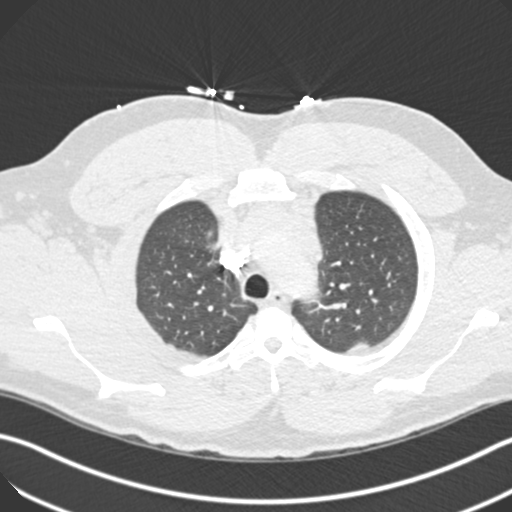
[im 248/272  soft-tissue]
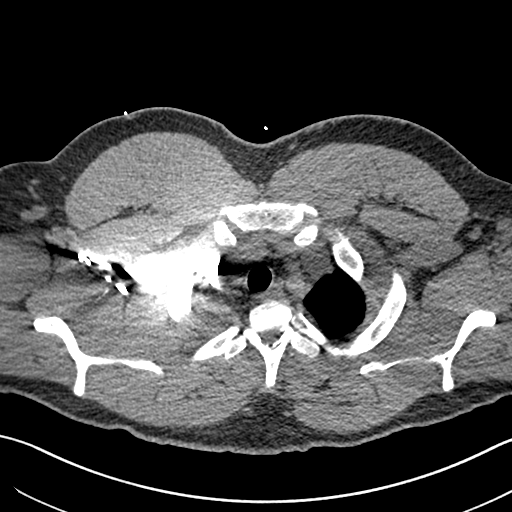
[im 260/272  lung]
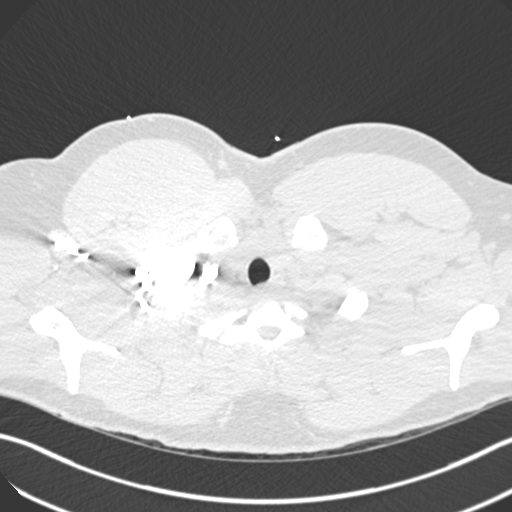

[Series 7: coronal mpr · coronal · 0.54mm/px · 2 of 106 slices shown]
[im 36/106  soft-tissue]
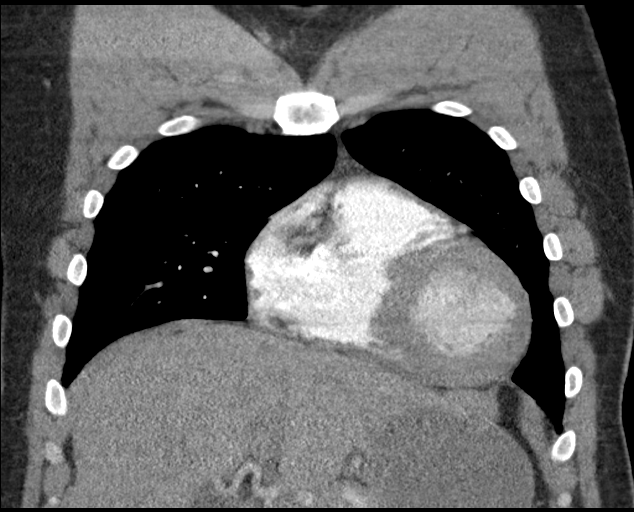
[im 71/106  soft-tissue]
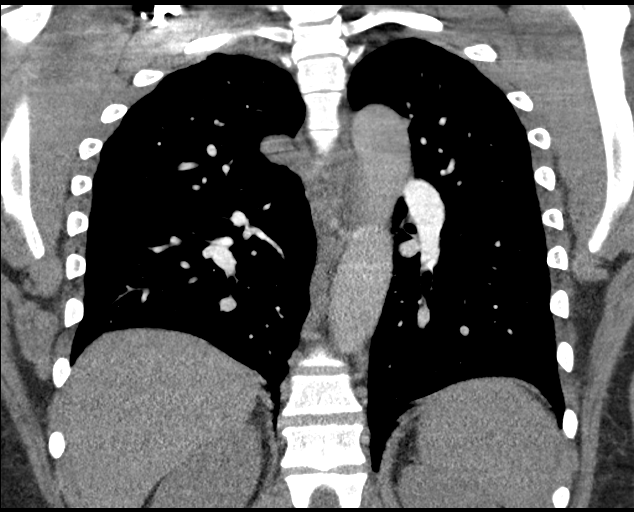

[19 of 46 positions shown; findings below may reference images not displayed]

FINDINGS: Cardiovascular: Borderline cardiomegaly. No pericardial effusion.
The thoracic aorta is grossly unremarkable for the degree of
opacification. Evaluation of the pulmonary arteries is somewhat
limited due to suboptimal opacification and respiratory motion
artifact. No large or central pulmonary artery embolus identified.

Mediastinum/Nodes: No hilar or mediastinal adenopathy. The esophagus
is grossly unremarkable. No mediastinal fluid collection.

Lungs/Pleura: The lungs are clear. There is no pleural effusion or
pneumothorax. The central airways are patent.

Upper Abdomen: No acute abnormality.

Musculoskeletal: No chest wall abnormality. No acute or significant
osseous findings.

Review of the MIP images confirms the above findings.
IMPRESSION: No acute intrathoracic pathology. No CT evidence of central
pulmonary artery embolus.

## 2021-03-02 IMAGING — DX DG CHEST 1V PORT
1 series · 1 of 1 positions shown · non-contrast
Comparison: July 20, 2009

CLINICAL DATA: Chest pain and shortness of breath

EXAM:
PORTABLE CHEST 1 VIEW

[chest ap]
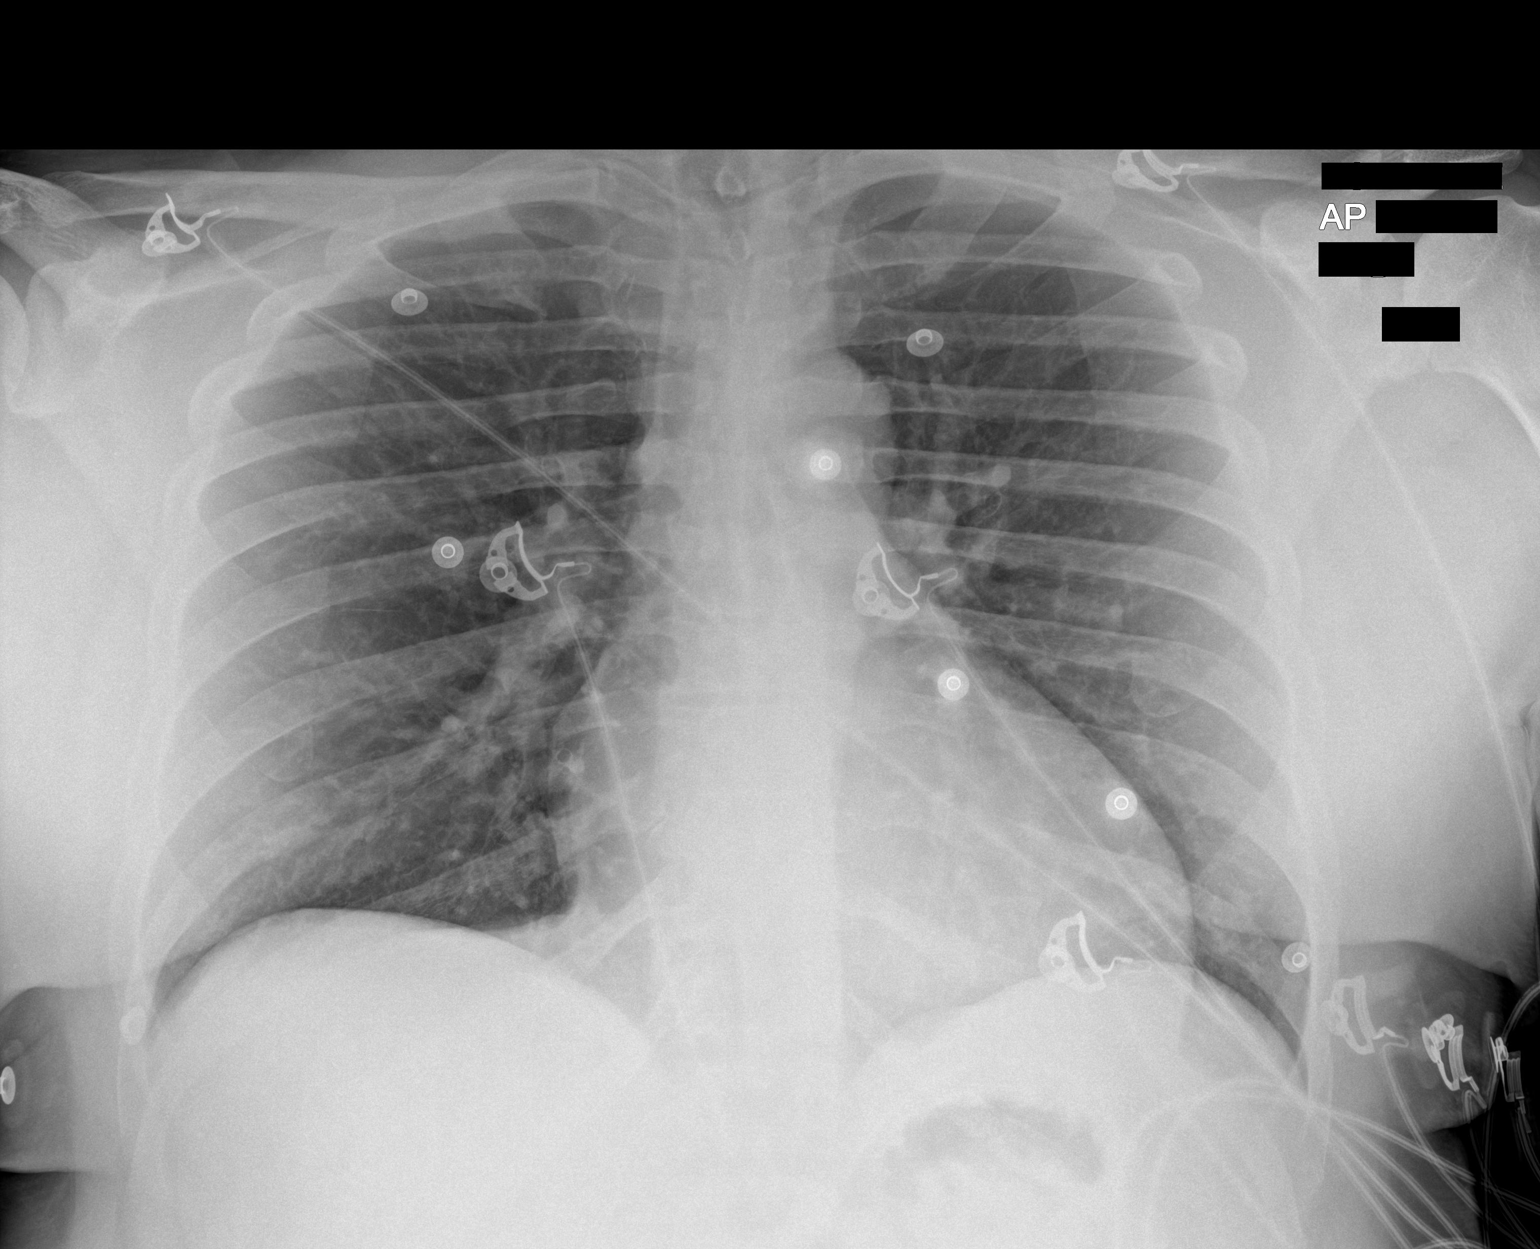

[1 of 1 positions shown; findings below may reference images not displayed]

FINDINGS: Lungs are clear. Heart size and pulmonary vascularity are normal. No
adenopathy. No pneumothorax. No bone lesions.
IMPRESSION: No edema or consolidation.

## 2021-03-03 IMAGING — US US CAROTID DUPLEX BILAT
1 series · 13 of 24 positions shown · non-contrast
Comparison: None.

CLINICAL DATA: 37-year-old male with presyncope

EXAM:
BILATERAL CAROTID DUPLEX ULTRASOUND
TECHNIQUE: Gray scale imaging, color Doppler and duplex ultrasound were
performed of bilateral carotid and vertebral arteries in the neck.

[Series 1: us carotid duplex bilat · 13 of 66 slices shown]
[im 1/66]
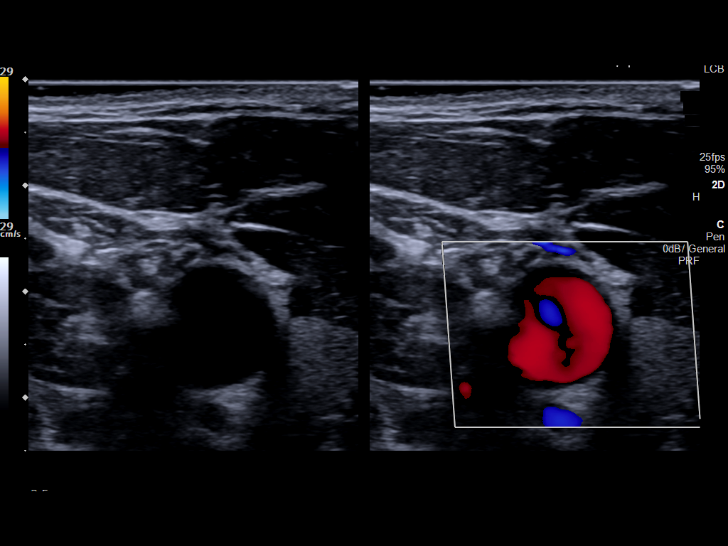
[im 6/66]
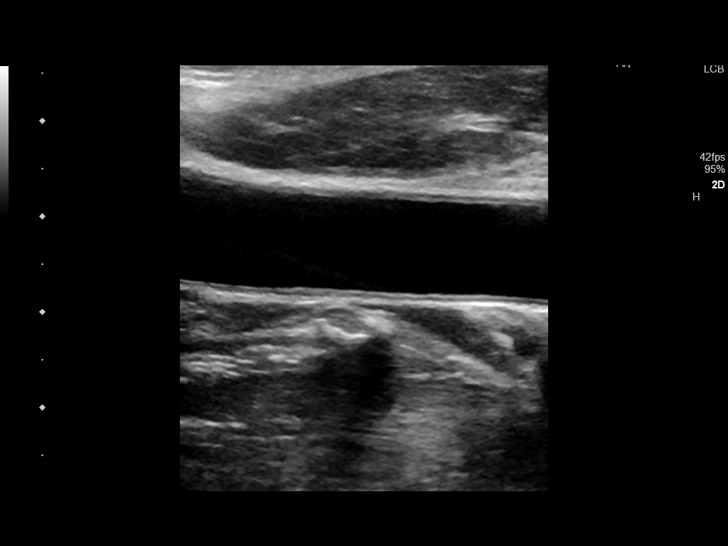
[im 12/66]
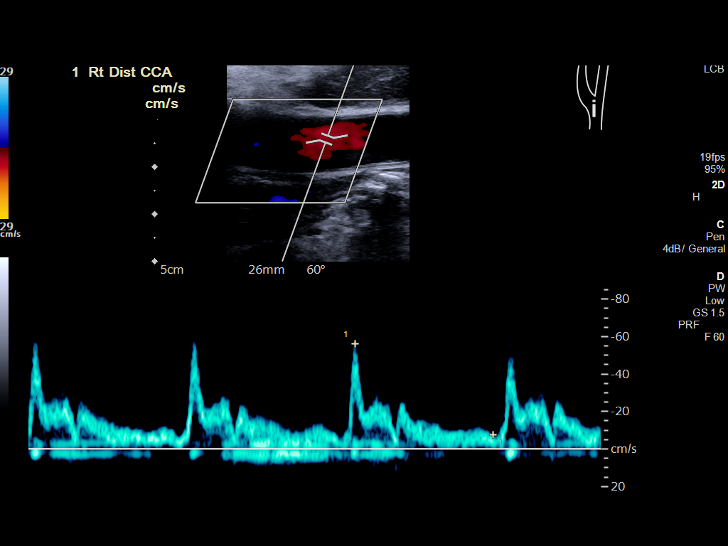
[im 17/66]
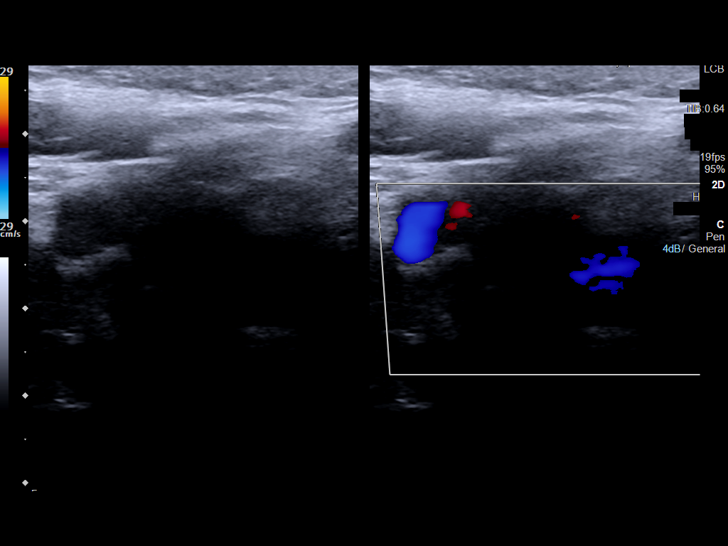
[im 23/66]
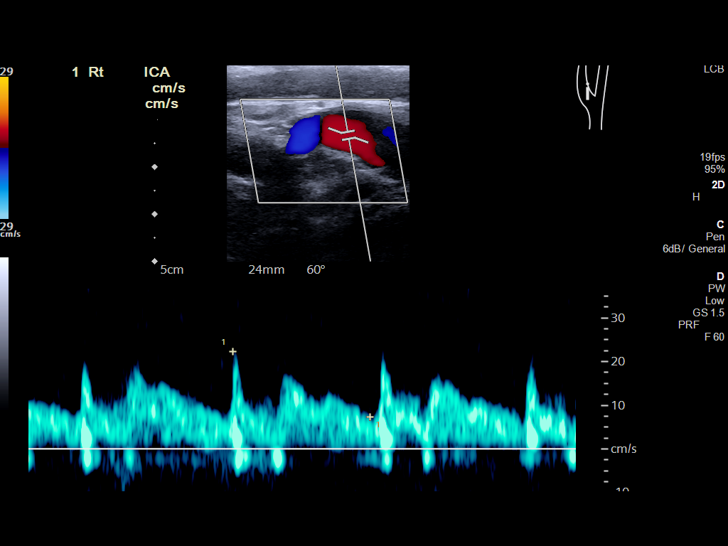
[im 29/66]
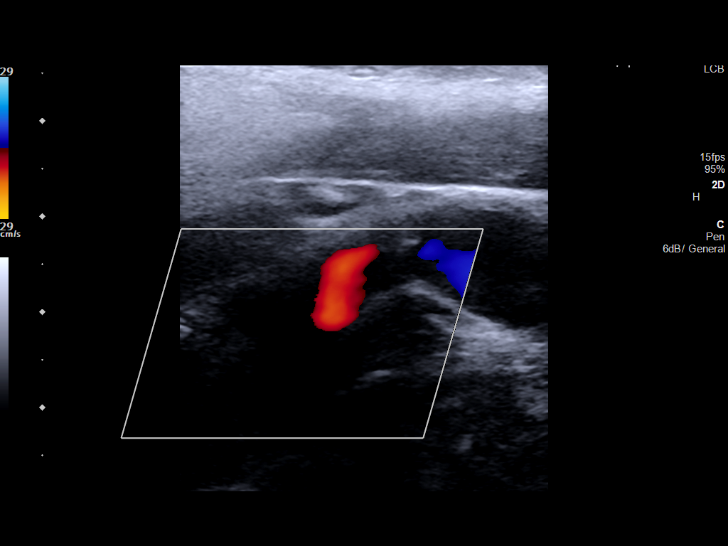
[im 34/66]
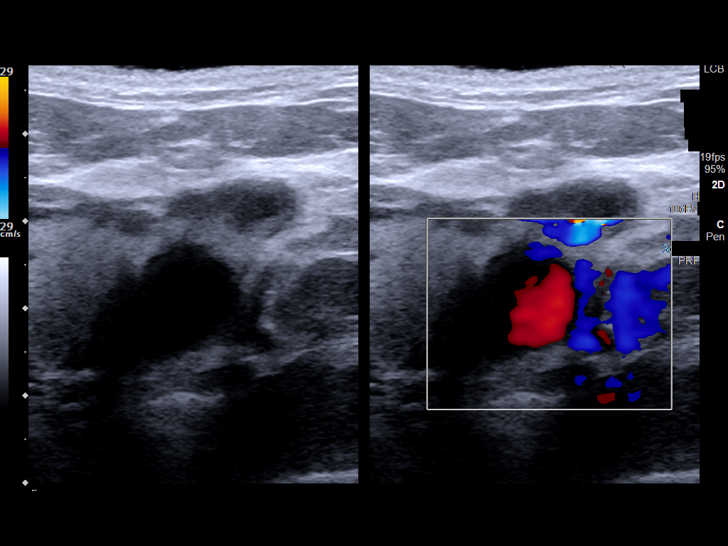
[im 37/66]
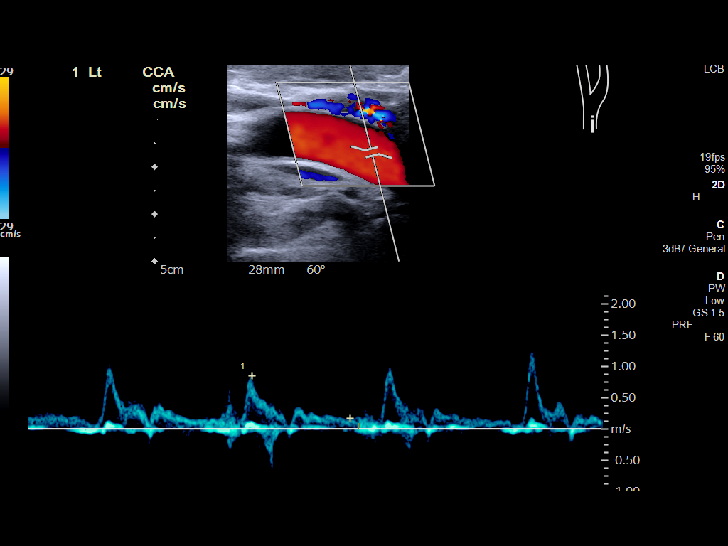
[im 43/66]
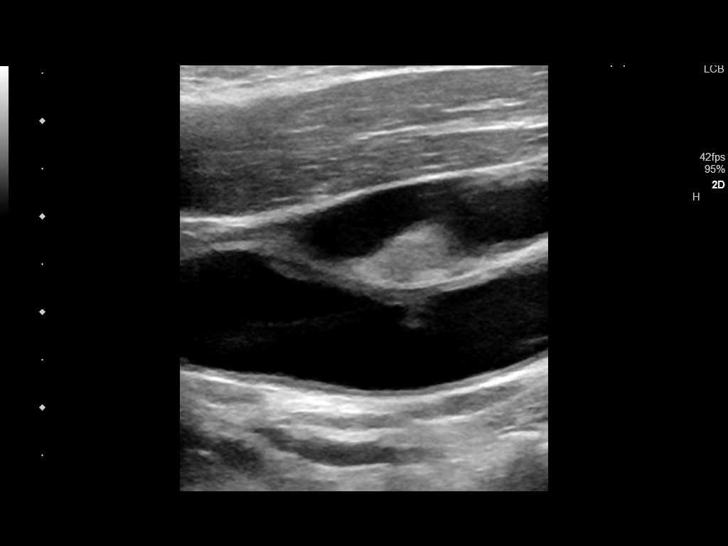
[im 49/66]
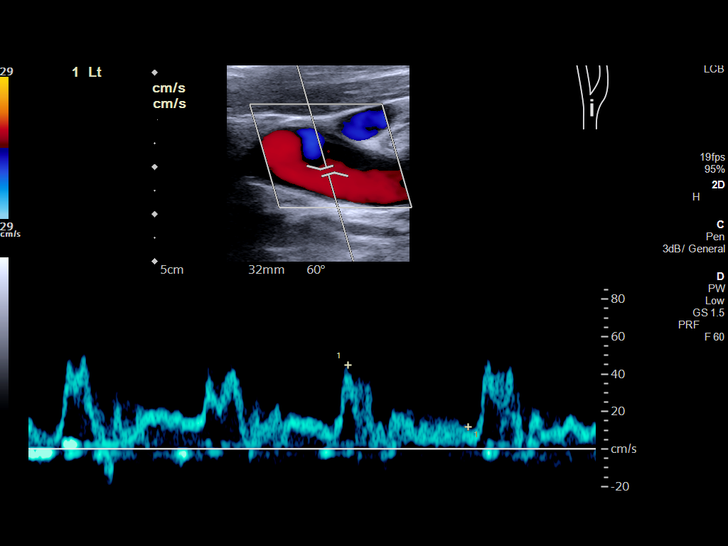
[im 54/66]
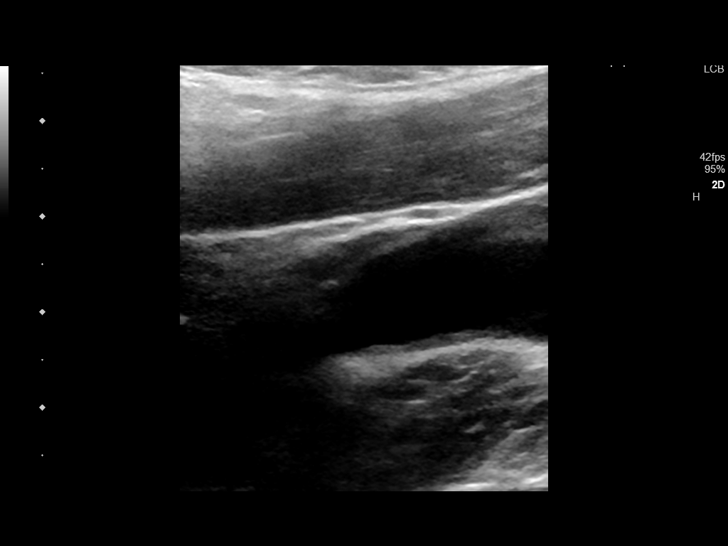
[im 60/66]
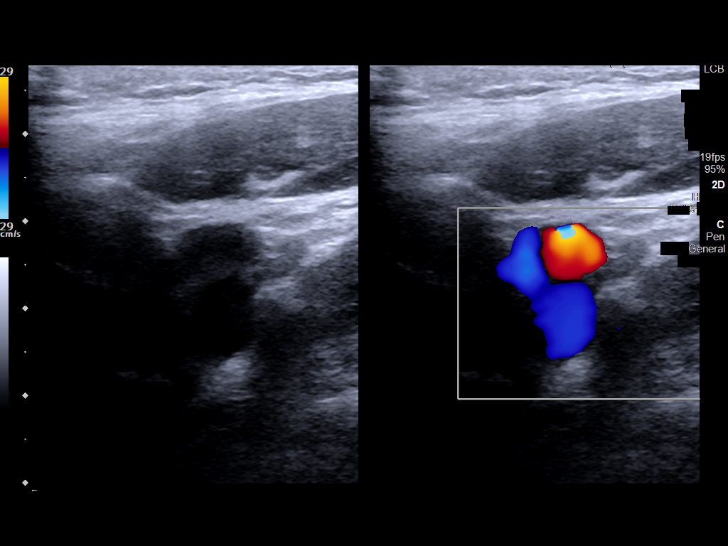
[im 66/66]
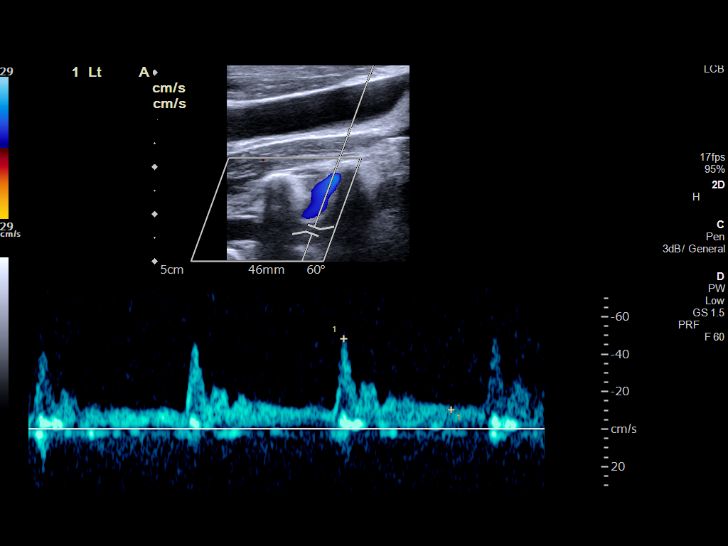

[13 of 24 positions shown; findings below may reference images not displayed]

FINDINGS: Criteria: Quantification of carotid stenosis is based on velocity
parameters that correlate the residual internal carotid diameter
with NASCET-based stenosis levels, using the diameter of the distal
internal carotid lumen as the denominator for stenosis measurement.

The following velocity measurements were obtained:

RIGHT
ICA: 80/22 cm/sec
CCA: 74/10 cm/sec

SYSTOLIC ICA/CCA RATIO:

ECA:  112 cm/sec

LEFT

ICA: 51/14 cm/sec

CCA: 100/17 cm/sec

SYSTOLIC ICA/CCA RATIO:

ECA:  107 cm/sec

RIGHT CAROTID ARTERY: No significant atherosclerotic plaque or
evidence of stenosis.

RIGHT VERTEBRAL ARTERY:  Patent with normal antegrade flow.

LEFT CAROTID ARTERY: No significant atherosclerotic plaque or
evidence of stenosis.

LEFT VERTEBRAL ARTERY:  Patent with normal antegrade flow.
IMPRESSION: No evidence of atherosclerotic plaque or stenosis in either internal
carotid artery.

The vertebral arteries are patent with normal antegrade flow.

## 2021-05-30 ENCOUNTER — Encounter (HOSPITAL_COMMUNITY): Payer: Self-pay | Admitting: *Deleted

## 2021-05-30 ENCOUNTER — Other Ambulatory Visit: Payer: Self-pay

## 2021-05-30 ENCOUNTER — Emergency Department (HOSPITAL_COMMUNITY)
Admission: EM | Admit: 2021-05-30 | Discharge: 2021-05-30 | Disposition: A | Discharge status: Home / Self Care | Payer: Self-pay | Attending: Emergency Medicine | Admitting: Emergency Medicine

## 2021-05-30 DIAGNOSIS — M25571 Pain in right ankle and joints of right foot: Secondary | ICD-10-CM | POA: Insufficient documentation

## 2021-05-30 DIAGNOSIS — S01511A Laceration without foreign body of lip, initial encounter: Secondary | ICD-10-CM | POA: Insufficient documentation

## 2021-05-30 DIAGNOSIS — W19XXXA Unspecified fall, initial encounter: Secondary | ICD-10-CM | POA: Insufficient documentation

## 2021-05-30 DIAGNOSIS — R42 Dizziness and giddiness: Secondary | ICD-10-CM | POA: Insufficient documentation

## 2021-05-30 DIAGNOSIS — Z5321 Procedure and treatment not carried out due to patient leaving prior to being seen by health care provider: Secondary | ICD-10-CM | POA: Insufficient documentation

## 2021-05-30 HISTORY — DX: Acute myocardial infarction, unspecified: I21.9

## 2021-05-30 NOTE — ED Notes (Signed)
Reported by screener that pt left without being seen.

## 2021-05-30 NOTE — ED Triage Notes (Addendum)
Pt here for multiple complaints, lac to inner bottom lip-due to fall few days ago.  Pt with ankle pain as well to right. Pt not able to state if injured or not, pain started this morning and not able to put weight on it.  Pt came here and started to have dizziness.

## 2021-07-16 ENCOUNTER — Emergency Department: Payer: Self-pay

## 2021-07-16 ENCOUNTER — Encounter: Payer: Self-pay | Admitting: Emergency Medicine

## 2021-07-16 ENCOUNTER — Other Ambulatory Visit: Payer: Self-pay

## 2021-07-16 DIAGNOSIS — Z5321 Procedure and treatment not carried out due to patient leaving prior to being seen by health care provider: Secondary | ICD-10-CM | POA: Insufficient documentation

## 2021-07-16 DIAGNOSIS — R079 Chest pain, unspecified: Secondary | ICD-10-CM | POA: Insufficient documentation

## 2021-07-16 LAB — CBC
HCT: 43.2 % (ref 39.0–52.0)
Hemoglobin: 15.3 g/dL (ref 13.0–17.0)
MCH: 30.7 pg (ref 26.0–34.0)
MCHC: 35.4 g/dL (ref 30.0–36.0)
MCV: 86.6 fL (ref 80.0–100.0)
Platelets: 168 10*3/uL (ref 150–400)
RBC: 4.99 MIL/uL (ref 4.22–5.81)
RDW: 13.7 % (ref 11.5–15.5)
WBC: 9.6 10*3/uL (ref 4.0–10.5)
nRBC: 0 % (ref 0.0–0.2)

## 2021-07-16 NOTE — ED Triage Notes (Signed)
Pt arrived via POV with reports of chest pain that started tonight after cooking, pt has hx of MI reports the pain is on the R side, pt states same side was hurting when he had a heart attack.  Pt states he has hx of blockage. Pt has seen Dr. Okey Dupre in the past.  Pt states he passed out tonight as well.

## 2021-07-17 ENCOUNTER — Emergency Department
Admission: EM | Admit: 2021-07-17 | Discharge: 2021-07-17 | Disposition: A | Discharge status: Home / Self Care | Payer: Self-pay | Attending: Emergency Medicine | Admitting: Emergency Medicine

## 2021-07-17 LAB — BASIC METABOLIC PANEL
Anion gap: 9 (ref 5–15)
BUN: 9 mg/dL (ref 6–20)
CO2: 29 mmol/L (ref 22–32)
Calcium: 9.4 mg/dL (ref 8.9–10.3)
Chloride: 99 mmol/L (ref 98–111)
Creatinine, Ser: 1.13 mg/dL (ref 0.61–1.24)
GFR, Estimated: >60 mL/min (ref >60)
Glucose, Bld: 114 mg/dL — ABNORMAL HIGH (ref 70–99)
Potassium: 3.4 mmol/L — ABNORMAL LOW (ref 3.5–5.1)
Sodium: 137 mmol/L (ref 135–145)

## 2021-07-17 LAB — TROPONIN I (HIGH SENSITIVITY): Troponin I (High Sensitivity): 9 ng/L (ref <18)

## 2021-07-17 NOTE — ED Notes (Addendum)
No answer when called several times from lobby & outside 

## 2021-07-17 NOTE — ED Notes (Signed)
No answer when called several times from lobby 

## 2021-08-17 ENCOUNTER — Emergency Department: Payer: Self-pay

## 2021-08-17 ENCOUNTER — Emergency Department
Admission: EM | Admit: 2021-08-17 | Discharge: 2021-08-17 | Disposition: A | Discharge status: Home / Self Care | Payer: Self-pay | Attending: Emergency Medicine | Admitting: Emergency Medicine

## 2021-08-17 ENCOUNTER — Other Ambulatory Visit: Payer: Self-pay

## 2021-08-17 DIAGNOSIS — R0602 Shortness of breath: Secondary | ICD-10-CM | POA: Insufficient documentation

## 2021-08-17 DIAGNOSIS — R110 Nausea: Secondary | ICD-10-CM | POA: Insufficient documentation

## 2021-08-17 DIAGNOSIS — Z20822 Contact with and (suspected) exposure to covid-19: Secondary | ICD-10-CM | POA: Insufficient documentation

## 2021-08-17 DIAGNOSIS — R197 Diarrhea, unspecified: Secondary | ICD-10-CM | POA: Insufficient documentation

## 2021-08-17 DIAGNOSIS — Z5321 Procedure and treatment not carried out due to patient leaving prior to being seen by health care provider: Secondary | ICD-10-CM | POA: Insufficient documentation

## 2021-08-17 DIAGNOSIS — J45909 Unspecified asthma, uncomplicated: Secondary | ICD-10-CM | POA: Insufficient documentation

## 2021-08-17 LAB — CBC
HCT: 42.8 % (ref 39.0–52.0)
Hemoglobin: 14.8 g/dL (ref 13.0–17.0)
MCH: 29.7 pg (ref 26.0–34.0)
MCHC: 34.6 g/dL (ref 30.0–36.0)
MCV: 85.9 fL (ref 80.0–100.0)
Platelets: 163 10*3/uL (ref 150–400)
RBC: 4.98 MIL/uL (ref 4.22–5.81)
RDW: 14.2 % (ref 11.5–15.5)
WBC: 9 10*3/uL (ref 4.0–10.5)
nRBC: 0 % (ref 0.0–0.2)

## 2021-08-17 LAB — BASIC METABOLIC PANEL
Anion gap: 9 (ref 5–15)
BUN: 17 mg/dL (ref 6–20)
CO2: 25 mmol/L (ref 22–32)
Calcium: 8.6 mg/dL — ABNORMAL LOW (ref 8.9–10.3)
Chloride: 104 mmol/L (ref 98–111)
Creatinine, Ser: 1.31 mg/dL — ABNORMAL HIGH (ref 0.61–1.24)
GFR, Estimated: >60 mL/min (ref >60)
Glucose, Bld: 107 mg/dL — ABNORMAL HIGH (ref 70–99)
Potassium: 3.8 mmol/L (ref 3.5–5.1)
Sodium: 138 mmol/L (ref 135–145)

## 2021-08-17 LAB — RESP PANEL BY RT-PCR (FLU A&B, COVID) ARPGX2
Influenza A by PCR: NEGATIVE
Influenza B by PCR: NEGATIVE
SARS Coronavirus 2 by RT PCR: NEGATIVE

## 2021-08-17 LAB — TROPONIN I (HIGH SENSITIVITY): Troponin I (High Sensitivity): 11 ng/L (ref <18)

## 2021-08-17 NOTE — ED Triage Notes (Signed)
Pt called from [facility] to treatment room, no repsonse 

## 2021-08-17 NOTE — ED Triage Notes (Signed)
Pt called x's 3, no response ?

## 2021-08-17 NOTE — ED Notes (Signed)
Pt called x's 3, no response ?

## 2021-08-17 NOTE — ED Triage Notes (Signed)
Pt here with SOB x 2 days. Pt has hx of asthma and has been using his inhaler with no relief. Pt also has a hx of MI in 2020. Pt also having nausea and diarrhea. Pt in NAD in triage.

## 2022-01-23 ENCOUNTER — Emergency Department: Payer: Self-pay

## 2022-01-23 ENCOUNTER — Emergency Department
Admission: EM | Admit: 2022-01-23 | Discharge: 2022-01-23 | Disposition: A | Discharge status: Home / Self Care | Payer: Self-pay | Attending: Emergency Medicine | Admitting: Emergency Medicine

## 2022-01-23 ENCOUNTER — Other Ambulatory Visit: Payer: Self-pay

## 2022-01-23 DIAGNOSIS — R079 Chest pain, unspecified: Secondary | ICD-10-CM | POA: Insufficient documentation

## 2022-01-23 DIAGNOSIS — I10 Essential (primary) hypertension: Secondary | ICD-10-CM

## 2022-01-23 DIAGNOSIS — I110 Hypertensive heart disease with heart failure: Secondary | ICD-10-CM | POA: Insufficient documentation

## 2022-01-23 DIAGNOSIS — I509 Heart failure, unspecified: Secondary | ICD-10-CM | POA: Insufficient documentation

## 2022-01-23 LAB — BASIC METABOLIC PANEL
Anion gap: 8 (ref 5–15)
BUN: 10 mg/dL (ref 6–20)
CO2: 26 mmol/L (ref 22–32)
Calcium: 8.9 mg/dL (ref 8.9–10.3)
Chloride: 105 mmol/L (ref 98–111)
Creatinine, Ser: 1.17 mg/dL (ref 0.61–1.24)
GFR, Estimated: >60 mL/min (ref >60)
Glucose, Bld: 89 mg/dL (ref 70–99)
Potassium: 3.8 mmol/L (ref 3.5–5.1)
Sodium: 139 mmol/L (ref 135–145)

## 2022-01-23 LAB — CBC
HCT: 44.6 % (ref 39.0–52.0)
Hemoglobin: 14.6 g/dL (ref 13.0–17.0)
MCH: 27.7 pg (ref 26.0–34.0)
MCHC: 32.7 g/dL (ref 30.0–36.0)
MCV: 84.5 fL (ref 80.0–100.0)
Platelets: 216 10*3/uL (ref 150–400)
RBC: 5.28 MIL/uL (ref 4.22–5.81)
RDW: 15.6 % — ABNORMAL HIGH (ref 11.5–15.5)
WBC: 6.9 10*3/uL (ref 4.0–10.5)
nRBC: 0 % (ref 0.0–0.2)

## 2022-01-23 LAB — TROPONIN I (HIGH SENSITIVITY)
Troponin I (High Sensitivity): 12 ng/L (ref <18)
Troponin I (High Sensitivity): 13 ng/L (ref <18)

## 2022-01-23 MED ORDER — LOSARTAN POTASSIUM 50 MG PO TABS
50.0000 mg | ORAL_TABLET | Freq: Once | ORAL | Status: AC
  Administered 2022-01-23: 50 mg via ORAL
  Filled 2022-01-23: qty 1

## 2022-01-23 MED ORDER — LOSARTAN POTASSIUM 50 MG PO TABS: 100.0000 mg | ORAL_TABLET | Freq: Every day | ORAL | 5 refills | Status: AC

## 2022-01-23 MED ORDER — NITROGLYCERIN 0.4 MG SL SUBL: 0.4000 mg | SUBLINGUAL_TABLET | SUBLINGUAL | 2 refills | Status: AC | PRN

## 2022-01-23 MED ORDER — CARVEDILOL 25 MG PO TABS
25.0000 mg | ORAL_TABLET | Freq: Two times a day (BID) | ORAL | 5 refills | Status: AC
Start: 2022-01-23 — End: 2023-01-23

## 2022-01-23 MED ORDER — LABETALOL HCL 5 MG/ML IV SOLN
10.0000 mg | Freq: Once | INTRAVENOUS | Status: AC
  Administered 2022-01-23: 10 mg via INTRAVENOUS
  Filled 2022-01-23: qty 4

## 2022-01-23 MED ORDER — AMLODIPINE BESYLATE 5 MG PO TABS
5.0000 mg | ORAL_TABLET | Freq: Once | ORAL | Status: AC
  Administered 2022-01-23: 5 mg via ORAL
  Filled 2022-01-23: qty 1

## 2022-01-23 MED ORDER — AMLODIPINE BESYLATE 5 MG PO TABS: 5.0000 mg | ORAL_TABLET | Freq: Every day | ORAL | 5 refills | Status: AC

## 2022-01-23 MED ORDER — ATORVASTATIN CALCIUM 80 MG PO TABS
80.0000 mg | ORAL_TABLET | Freq: Every day | ORAL | 5 refills | Status: AC
Start: 2022-01-23 — End: ?

## 2022-01-23 MED ORDER — ASPIRIN 81 MG PO TBEC: 81.0000 mg | DELAYED_RELEASE_TABLET | Freq: Every day | ORAL | 5 refills | Status: AC

## 2022-01-23 NOTE — ED Triage Notes (Signed)
Pt c/o substernal chest pain radiating into the left chest that started about PTA, states he took 1 SL nitro with some relief. Pt is in NAD on arrival ?

## 2022-01-23 NOTE — ED Provider Notes (Signed)
? ?[facility] ?Provider Note ? ? ? Event Date/Time  ? First MD Initiated Contact with Patient 01/23/22 0914   ?  (approximate) ? ?History  ? ?Chief Complaint: Chest Pain ? ?HPI ? ?Marcus Bauer is a 41 y.o. male with a past medical history of a prior MI, hypertension, CHF, presents to the emergency department for chest pain.  According to the patient approximate 30 minutes prior to arrival he developed pain in his right chest with some radiation into the arm.  Denies any shortness of breath.  Patient took a sublingual nitroglycerin with some relief.  States minimal discomfort currently.  Patient noted to be quite hypertensive 170/132.  States he has not been taking his blood pressure medication over the past week or so. ? ?Physical Exam  ? ?Triage Vital Signs: ?ED Triage Vitals  ?Enc Vitals Group  ?   BP 01/23/22 0908 (!) 171/132  ?   Pulse Rate 01/23/22 0908 82  ?   Resp 01/23/22 0908 17  ?   Temp 01/23/22 0908 98.1 ?F (36.7 ?C)  ?   Temp Source 01/23/22 0908 Oral  ?   SpO2 01/23/22 0908 99 %  ?   Weight 01/23/22 0909 220 lb (99.8 kg)  ?   Height 01/23/22 0909 5\' 7"  (1.702 m)  ?   Head Circumference --   ?   Peak Flow --   ?   Pain Score 01/23/22 0909 7  ?   Pain Loc --   ?   Pain Edu? --   ?   Excl. in [place]? --   ? ? ?Most recent vital signs: ?Vitals:  ? 01/23/22 0908  ?BP: (!) 171/132  ?Pulse: 82  ?Resp: 17  ?Temp: 98.1 ?F (36.7 ?C)  ?SpO2: 99%  ? ? ?General: Awake, no distress.  ?CV:  Good peripheral perfusion.  Regular rate and rhythm  ?Resp:  Normal effort.  Equal breath sounds bilaterally.  ?Abd:  No distention.  Soft, nontender.  No rebound or guarding. ? ? ?ED Results / Procedures / Treatments  ? ?EKG ? ?EKG viewed and interpreted by myself shows a normal sinus rhythm 88 bpm with a narrow QRS, normal axis, normal intervals, no concerning ST changes. ? ?RADIOLOGY ? ?I personally reviewed the chest x-ray images, I do not see any major finding on my evaluation. ?Radiology is read the  chest x-ray is negative ? ? ?MEDICATIONS ORDERED IN ED: ?Medications  ?labetalol (NORMODYNE) injection 10 mg (has no administration in time range)  ? ? ? ?IMPRESSION / MDM / ASSESSMENT AND PLAN / ED COURSE  ?I reviewed the triage vital signs and the nursing notes. ? ?Patient presents emergency department for chest pain starting approximate 30 minutes prior to arrival.  Patient has a history of a prior MI 2 years ago, no stents placed.  Patient is quite hypertensive 171/132.  We will dose 10 mg of IV labetalol to try to reduce diastolic pressure/afterload.  We will continue to closely monitor we will check labs including cardiac enzymes.  Differential would include musculoskeletal pain, CHF exacerbation, ACS, pneumonia/pneumothorax. ? ?Patient's work-up is overall reassuring.  Blood pressure is coming down after home blood pressure meds.  Patient's chest x-ray is negative.  Troponin unchanged x2.  Patient remains chest pain-free throughout his stay in the emergency department.  Chemistry is normal.  CBC is normal.  We will refill the patient's blood pressure and home medications.  Patient to follow-up with his doctor. ? ?FINAL  CLINICAL IMPRESSION(S) / ED DIAGNOSES  ? ?Chest pain ? ? ?Note:  This document was prepared using Dragon voice recognition software and may include unintentional dictation errors. ?  ?Minna Antis, MD ?01/23/22 1243 ? ?

## 2022-02-10 ENCOUNTER — Other Ambulatory Visit: Payer: Self-pay

## 2022-02-10 ENCOUNTER — Emergency Department: Payer: Self-pay

## 2022-02-10 ENCOUNTER — Inpatient Hospital Stay
Admission: EM | Admit: 2022-02-10 | Discharge: 2022-02-13 | DRG: 558 | Disposition: A | Discharge status: Home / Self Care | Payer: Self-pay | Attending: Osteopathic Medicine | Admitting: Osteopathic Medicine

## 2022-02-10 DIAGNOSIS — F17210 Nicotine dependence, cigarettes, uncomplicated: Secondary | ICD-10-CM | POA: Diagnosis present

## 2022-02-10 DIAGNOSIS — I25118 Atherosclerotic heart disease of native coronary artery with other forms of angina pectoris: Secondary | ICD-10-CM | POA: Diagnosis present

## 2022-02-10 DIAGNOSIS — R079 Chest pain, unspecified: Secondary | ICD-10-CM | POA: Diagnosis present

## 2022-02-10 DIAGNOSIS — F1990 Other psychoactive substance use, unspecified, uncomplicated: Secondary | ICD-10-CM

## 2022-02-10 DIAGNOSIS — F1410 Cocaine abuse, uncomplicated: Secondary | ICD-10-CM | POA: Diagnosis present

## 2022-02-10 DIAGNOSIS — G4733 Obstructive sleep apnea (adult) (pediatric): Secondary | ICD-10-CM | POA: Diagnosis present

## 2022-02-10 DIAGNOSIS — Z20822 Contact with and (suspected) exposure to covid-19: Secondary | ICD-10-CM | POA: Diagnosis present

## 2022-02-10 DIAGNOSIS — R778 Other specified abnormalities of plasma proteins: Secondary | ICD-10-CM | POA: Diagnosis present

## 2022-02-10 DIAGNOSIS — M6282 Rhabdomyolysis: Principal | ICD-10-CM | POA: Diagnosis present

## 2022-02-10 DIAGNOSIS — I10 Essential (primary) hypertension: Secondary | ICD-10-CM | POA: Diagnosis present

## 2022-02-10 DIAGNOSIS — I5032 Chronic diastolic (congestive) heart failure: Secondary | ICD-10-CM | POA: Diagnosis present

## 2022-02-10 DIAGNOSIS — N179 Acute kidney failure, unspecified: Secondary | ICD-10-CM | POA: Diagnosis present

## 2022-02-10 DIAGNOSIS — Z91013 Allergy to seafood: Secondary | ICD-10-CM

## 2022-02-10 DIAGNOSIS — Z8249 Family history of ischemic heart disease and other diseases of the circulatory system: Secondary | ICD-10-CM

## 2022-02-10 DIAGNOSIS — Z781 Physical restraint status: Secondary | ICD-10-CM

## 2022-02-10 DIAGNOSIS — Z83438 Family history of other disorder of lipoprotein metabolism and other lipidemia: Secondary | ICD-10-CM

## 2022-02-10 DIAGNOSIS — F1910 Other psychoactive substance abuse, uncomplicated: Secondary | ICD-10-CM | POA: Diagnosis present

## 2022-02-10 DIAGNOSIS — Z888 Allergy status to other drugs, medicaments and biological substances status: Secondary | ICD-10-CM

## 2022-02-10 DIAGNOSIS — R4182 Altered mental status, unspecified: Principal | ICD-10-CM

## 2022-02-10 DIAGNOSIS — F10921 Alcohol use, unspecified with intoxication delirium: Secondary | ICD-10-CM | POA: Diagnosis present

## 2022-02-10 DIAGNOSIS — I252 Old myocardial infarction: Secondary | ICD-10-CM

## 2022-02-10 DIAGNOSIS — Y905 Blood alcohol level of 100-119 mg/100 ml: Secondary | ICD-10-CM | POA: Diagnosis present

## 2022-02-10 DIAGNOSIS — E7220 Disorder of urea cycle metabolism, unspecified: Secondary | ICD-10-CM | POA: Diagnosis present

## 2022-02-10 DIAGNOSIS — J45909 Unspecified asthma, uncomplicated: Secondary | ICD-10-CM | POA: Diagnosis present

## 2022-02-10 DIAGNOSIS — I5030 Unspecified diastolic (congestive) heart failure: Secondary | ICD-10-CM | POA: Diagnosis present

## 2022-02-10 DIAGNOSIS — I110 Hypertensive heart disease with heart failure: Secondary | ICD-10-CM | POA: Diagnosis present

## 2022-02-10 DIAGNOSIS — Z7982 Long term (current) use of aspirin: Secondary | ICD-10-CM

## 2022-02-10 DIAGNOSIS — R748 Abnormal levels of other serum enzymes: Secondary | ICD-10-CM

## 2022-02-10 DIAGNOSIS — K219 Gastro-esophageal reflux disease without esophagitis: Secondary | ICD-10-CM | POA: Diagnosis present

## 2022-02-10 DIAGNOSIS — F1210 Cannabis abuse, uncomplicated: Secondary | ICD-10-CM | POA: Diagnosis present

## 2022-02-10 DIAGNOSIS — Z79899 Other long term (current) drug therapy: Secondary | ICD-10-CM

## 2022-02-10 LAB — ETHANOL: Alcohol, Ethyl (B): 106 mg/dL — ABNORMAL HIGH (ref <10)

## 2022-02-10 LAB — BLOOD GAS, VENOUS
Acid-base deficit: 3.2 mmol/L — ABNORMAL HIGH (ref 0.0–2.0)
Bicarbonate: 25.5 mmol/L (ref 20.0–28.0)
O2 Saturation: 71.3 %
Patient temperature: 37
pCO2, Ven: 61 mmHg — ABNORMAL HIGH (ref 44–60)
pH, Ven: 7.23 — ABNORMAL LOW (ref 7.25–7.43)
pO2, Ven: 48 mmHg — ABNORMAL HIGH (ref 32–45)

## 2022-02-10 LAB — CBC WITH DIFFERENTIAL/PLATELET
Abs Immature Granulocytes: 0.05 10*3/uL (ref 0.00–0.07)
Basophils Absolute: 0.1 10*3/uL (ref 0.0–0.1)
Basophils Relative: 1 %
Eosinophils Absolute: 0 10*3/uL (ref 0.0–0.5)
Eosinophils Relative: 1 %
HCT: 44.6 % (ref 39.0–52.0)
Hemoglobin: 14.4 g/dL (ref 13.0–17.0)
Immature Granulocytes: 1 %
Lymphocytes Relative: 34 %
Lymphs Abs: 2.9 10*3/uL (ref 0.7–4.0)
MCH: 27.4 pg (ref 26.0–34.0)
MCHC: 32.3 g/dL (ref 30.0–36.0)
MCV: 85 fL (ref 80.0–100.0)
Monocytes Absolute: 0.7 10*3/uL (ref 0.1–1.0)
Monocytes Relative: 8 %
Neutro Abs: 4.8 10*3/uL (ref 1.7–7.7)
Neutrophils Relative %: 55 %
Platelets: 191 10*3/uL (ref 150–400)
RBC: 5.25 MIL/uL (ref 4.22–5.81)
RDW: 15.4 % (ref 11.5–15.5)
WBC: 8.4 10*3/uL (ref 4.0–10.5)
nRBC: 0 % (ref 0.0–0.2)

## 2022-02-10 LAB — RESP PANEL BY RT-PCR (FLU A&B, COVID) ARPGX2
Influenza A by PCR: NEGATIVE
Influenza B by PCR: NEGATIVE
SARS Coronavirus 2 by RT PCR: NEGATIVE

## 2022-02-10 LAB — COMPREHENSIVE METABOLIC PANEL
ALT: 29 U/L (ref 0–44)
AST: 45 U/L — ABNORMAL HIGH (ref 15–41)
Albumin: 4 g/dL (ref 3.5–5.0)
Alkaline Phosphatase: 52 U/L (ref 38–126)
Anion gap: 14 (ref 5–15)
BUN: 13 mg/dL (ref 6–20)
CO2: 25 mmol/L (ref 22–32)
Calcium: 8.6 mg/dL — ABNORMAL LOW (ref 8.9–10.3)
Chloride: 100 mmol/L (ref 98–111)
Creatinine, Ser: 1.41 mg/dL — ABNORMAL HIGH (ref 0.61–1.24)
GFR, Estimated: >60 mL/min (ref >60)
Glucose, Bld: 102 mg/dL — ABNORMAL HIGH (ref 70–99)
Potassium: 3.6 mmol/L (ref 3.5–5.1)
Sodium: 139 mmol/L (ref 135–145)
Total Bilirubin: 0.8 mg/dL (ref 0.3–1.2)
Total Protein: 7.9 g/dL (ref 6.5–8.1)

## 2022-02-10 LAB — CBG MONITORING, ED: Glucose-Capillary: 84 mg/dL (ref 70–99)

## 2022-02-10 LAB — CK: Total CK: 578 U/L — ABNORMAL HIGH (ref 49–397)

## 2022-02-10 LAB — AMMONIA: Ammonia: 72 umol/L — ABNORMAL HIGH (ref 9–35)

## 2022-02-10 LAB — MAGNESIUM: Magnesium: 2.3 mg/dL (ref 1.7–2.4)

## 2022-02-10 LAB — TROPONIN I (HIGH SENSITIVITY): Troponin I (High Sensitivity): 20 ng/L — ABNORMAL HIGH (ref <18)

## 2022-02-10 MED ORDER — LACTATED RINGERS IV BOLUS
1000.0000 mL | Freq: Once | INTRAVENOUS | Status: AC
  Administered 2022-02-10: 1000 mL via INTRAVENOUS

## 2022-02-10 MED ORDER — LORAZEPAM 2 MG/ML IJ SOLN: 2.0000 mg | Freq: Once | INTRAMUSCULAR | Status: DC

## 2022-02-10 MED ORDER — ZIPRASIDONE MESYLATE 20 MG IM SOLR
20.0000 mg | Freq: Once | INTRAMUSCULAR | Status: AC
  Administered 2022-02-10: 20 mg via INTRAMUSCULAR

## 2022-02-10 NOTE — ED Provider Notes (Signed)
----------------------------------------- ?  11:03 PM on 02/10/2022 ?----------------------------------------- ? ?Assuming care from Dr. Antoine Primas.  In short, Marcus Bauer is a 41 y.o. male with a chief complaint of polysubstance abuse and altered mental status.  Refer to the original H&P for additional details. ? ?The current plan of care is to check lab work including CK and troponin which is slightly elevated on the first check, metabolic panel, reassess mental status, follow-up on CT scan. ? ?Patient is under involuntary commitment. ? ? ? ?Clinical Course as of 02/11/22 0316  ?Wed Feb 11, 2022  ?0100 CT HEAD WO CONTRAST ( ) ?I personally reviewed the patient's CT head and there is no evidence of acute abnormality, including acute bleed [CF]  ?0153 CK Total(!): 629 ?Patient's repeat CK has gone up slightly from 578-629.  I ordered 1/3 L of LR IV bolus.  Patient is calm and sleeping, no longer straining, still has a Software engineer. [CF]  ?0217 Troponin I (High Sensitivity)(!): 65 ?The patient's troponin has continued to trend up, now  to 65.   [CF]  ?0245 I repeated an EKG and there is no evidence of ischemia/STEMI. ? ?Given the constellation of abnormalities including his initial substance abuse altered mental status, dangerous behavior, troponin which is trending up, elevated CK which is trending up, acute kidney injury, and hyperammonemia, I am consulting the hospitalist for admission.  I also ordered lactulose 20 g p.o. but it is unclear that he is sufficiently awake to be able to take it safely by mouth. [CF]  ?0962 Consulted Dr. Arville Care with the hospitalist service.  We discussed the case in detail and he will admit. [CF]  ?  ?Clinical Course User Index ?[CF] Loleta Rose, MD  ? ? ? ?ED ECG REPORT ?ILoleta Rose, the attending physician, personally viewed and interpreted this ECG. ? ?Date: 02/11/2022 ?EKG Time: 2:38 AM ?Rate: 94 ?Rhythm: normal sinus rhythm ?QRS Axis: Left axis deviation ?Intervals:  normal ?ST/T Wave abnormalities: Non-specific ST segment / T-wave changes, but no clear evidence of acute ischemia. ?Narrative Interpretation: no definitive evidence of acute ischemia; does not meet STEMI criteria. ? ? ?ED ECG REPORT ?ILoleta Rose, the attending physician, personally viewed and interpreted this ECG. ? ?Date: 02/11/2022 ?EKG Time: 2:40 AM ?Rate: 96 ?Rhythm: normal sinus rhythm ?QRS Axis: Left axis deviation ?Intervals: normal ?ST/T Wave abnormalities: Non-specific ST segment / T-wave changes, but no clear evidence of acute ischemia. ?Narrative Interpretation: no definitive evidence of acute ischemia; does not meet STEMI criteria. ? ? ?Final diagnoses:  ?Altered mental status, unspecified altered mental status type  ?Alcohol intoxication with delirium (HCC)  ?Polysubstance use disorder  ?Abnormal CK  ?Troponin I above reference range  ?Acute kidney injury (HCC)  ?Hyperammonemia (HCC)  ? ? ?  ?Loleta Rose, MD ?02/11/22 [phone] ? ?

## 2022-02-10 NOTE — ED Triage Notes (Addendum)
Patient arrived via EMS from a hotel after being found unconscious. Per EMS, pt had ingested crack/cocaine. EMS was called earlier in the day for same, however, pt refused to be transported. Was given 6mg  Narcan intranasally and 1mg  IV. Pt combative, yelling, and unable to follow commands on arrival. MD at the bedside ?

## 2022-02-10 NOTE — ED Provider Notes (Signed)
? ?[facility] ?Provider Note ? ? ? Event Date/Time  ? First MD Initiated Contact with Patient 02/10/22 2015   ?  (approximate) ? ? ?History  ? ?Ingestion ? ? ?HPI ? ?Marcus Bauer is a 41 y.o. male with a past medical history of CHF, CAD, GERD, OSA, and HTN who presents via EMS after being found unconscious in a hotel room.  Per EMS he was on Cardizem arrival and administered 6 mg of intranasal Narcan and 1 mg IV Narcan the patient became agitated, and combative.  On arrival patient is agitated combative and screaming nonsensically and does not follow any commands.  No other history available immediately on patient presentation. ? ?  ?Past Medical History:  ?Diagnosis Date  ? (HFpEF) heart failure with preserved ejection fraction (HCC)   ? a 07/2019 Echo: EF 60-65%. No signif valvular dzs; b. 07/2019 Cath: LVEDP 30-6135mmHg.  ? Anxiety   ? Asthma   ? CAD (coronary artery disease)   ? a. 07/2019 NSTEMI/Cath: LM nl, LAD nl, D1/2/3 nl, LCX nl, OM1 60, OM2/3 nl, RCA large, nl, RPDA 100 w/ L->R collats. EF 50-55%.  ? GERD (gastroesophageal reflux disease)   ? Hypertension   ? MI (myocardial infarction) (HCC)   ? Sleep apnea   ? a. CPAP noncompliance.  ? ? ? ?Physical Exam  ?Triage Vital Signs: ?ED Triage Vitals  ?Enc Vitals Group  ?   BP --   ?   Pulse --   ?   Resp --   ?   Temp --   ?   Temp src --   ?   SpO2 02/10/22 2008 100 %  ?   Weight 02/10/22 2012 218 lb 6.9 oz (99.1 kg)  ?   Height 02/10/22 2012 5\' 8"  (1.727 m)  ?   Head Circumference --   ?   Peak Flow --   ?   Pain Score --   ?   Pain Loc --   ?   Pain Edu? --   ?   Excl. in GC? --   ? ? ?Most recent vital signs: ?Vitals:  ? 02/10/22 2250 02/10/22 2315  ?BP: 107/84 (!) 126/92  ?Pulse: 75 79  ?Resp: 15 18  ?Temp:  98.3 ?F (36.8 ?C)  ?SpO2: 98% 97%  ? ? ?General: Awake, awake yelling and thrashing back-and-forth on the stretcher not following any commands or answering any other questions. ?CV:  Good peripheral perfusion.  2+ radial pulse.   No murmur. ?Resp:  Normal effort.  Clear bilaterally. ?Abd:  No distention.  Soft throughout. ?Other:  2+ radial and DP pulses.  No obvious trauma to the face scalp head neck or extremities.  Pupils are 3 mm reactive bilaterally.  Patient withdraws extremities to noxious stimuli ? ? ?ED Results / Procedures / Treatments  ?Labs ?(all labs ordered are listed, but only abnormal results are displayed) ?Labs Reviewed  ?COMPREHENSIVE METABOLIC PANEL - Abnormal; Notable for the following components:  ?    Result Value  ? Glucose, Bld 102 (*)   ? Creatinine, Ser 1.41 (*)   ? Calcium 8.6 (*)   ? AST 45 (*)   ? All other components within normal limits  ?CK - Abnormal; Notable for the following components:  ? Total CK 578 (*)   ? All other components within normal limits  ?ETHANOL - Abnormal; Notable for the following components:  ? Alcohol, Ethyl (B) 106 (*)   ? All  other components within normal limits  ?AMMONIA - Abnormal; Notable for the following components:  ? Ammonia 72 (*)   ? All other components within normal limits  ?BLOOD GAS, VENOUS - Abnormal; Notable for the following components:  ? pH, Ven 7.23 (*)   ? pCO2, Ven 61 (*)   ? pO2, Ven 48 (*)   ? Acid-base deficit 3.2 (*)   ? All other components within normal limits  ?BLOOD GAS, VENOUS - Abnormal; Notable for the following components:  ? pO2, Ven 53 (*)   ? All other components within normal limits  ?TROPONIN I (HIGH SENSITIVITY) - Abnormal; Notable for the following components:  ? Troponin I (High Sensitivity) 20 (*)   ? All other components within normal limits  ?RESP PANEL BY RT-PCR (FLU A&B, COVID) ARPGX2  ?CBC WITH DIFFERENTIAL/PLATELET  ?MAGNESIUM  ?URINE DRUG SCREEN, QUALITATIVE ([facility] ONLY)  ?ACETAMINOPHEN LEVEL  ?SALICYLATE LEVEL  ?CK  ?CBG MONITORING, ED  ?TROPONIN I (HIGH SENSITIVITY)  ? ? ? ?EKG ? ?EKG is remarkable for sinus rhythm with a ventricular rate of 91, normal axis with nonspecific ST changes versus artifact in leads II and lead III as well  as aVF without other clear evidence of acute ischemia or significant arrhythmia. ? ? ?RADIOLOGY ? ? ? ?PROCEDURES: ? ?Critical Care performed: Yes, see critical care procedure note(s) ? ?.1-3 Lead EKG Interpretation ?Performed by: Gilles Chiquito, MD ?Authorized by: Gilles Chiquito, MD  ? ?  Interpretation: normal   ?  ECG rate assessment: normal   ?  Rhythm: sinus rhythm   ?  Ectopy: none   ?  Conduction: normal   ?.Critical Care ?Performed by: Gilles Chiquito, MD ?Authorized by: Gilles Chiquito, MD  ? ?Critical care provider statement:  ?  Critical care time (minutes):  30 ?  Critical care was necessary to treat or prevent imminent or life-threatening deterioration of the following conditions:  CNS failure or compromise ?  Critical care was time spent personally by me on the following activities:  Development of treatment plan with patient or surrogate, discussions with consultants, evaluation of patient's response to treatment, examination of patient, ordering and review of laboratory studies, ordering and review of radiographic studies, ordering and performing treatments and interventions, pulse oximetry, re-evaluation of patient's condition and review of old charts ? ?The patient is on the cardiac monitor to evaluate for evidence of arrhythmia and/or significant heart rate changes. ? ? ?MEDICATIONS ORDERED IN ED: ?Medications  ?LORazepam (ATIVAN) injection 2 mg (2 mg Intramuscular Not Given 02/10/22 2233)  ?lactated ringers bolus 1,000 mL (0 mLs Intravenous Stopped 02/10/22 2142)  ?ziprasidone (GEODON) injection 20 mg (20 mg Intramuscular Given 02/10/22 2015)  ?lactated ringers bolus 1,000 mL (0 mLs Intravenous Stopped 02/10/22 2332)  ? ? ? ?IMPRESSION / MDM / ASSESSMENT AND PLAN / ED COURSE  ?I reviewed the triage vital signs and the nursing notes. ?             ?               ? ?Differential diagnosis includes, but is not limited to polysubstance overdose, metabolic derangements, intracranial hemorrhage,  hypothyroidism, with lower suspicion based on his initial evaluation for acute infectious process. ? ?Given agitation and violent behavior towards staff on arrival patient immediately sedated with Geodon and placed in restraints for patient and staff safety.  He is immediately placed on end-tidal and cardiac monitoring.  CBG obtained is within normal limits.  He was started on IV fluids. ? ?EKG is remarkable for sinus rhythm with a ventricular rate of 91, normal axis with nonspecific ST changes versus artifact in leads II and lead III as well as aVF without other clear evidence of acute ischemia or significant arrhythmia.  Initial troponin is 20 which I suspect represents a mild demand ischemia.  Will defer anticoagulation and repeat.  ? ?CBC without leukocytosis or acute anemia.  BMP remarkable for no significant electrolyte or metabolic derangements.  Kidney function appears close to baseline with creatinine today of 1.41 compared to a range of 1.17-1.54 over the last year.  COVID influenza PCR is negative.  CK is 578.  Magnesium 2.3.  Serum ethanol 106.  Serum ammonia elevated 72. ? ?VBG on arrival with a pH of 7.23 with a PCO2 of 61 and bicarb 25.5 consistent with a respiratory acidosis.  Repeat VBG approximately hour later with a pH of 7.23 with a PCO2 of 52 and bicarb of 27.4 consistent with resolving respiratory acidosis. ? ?Patient signed over to SMA father at approximately 12:24 AM.  Plan is to reassess patient and follow-up repeat CK, troponin remaining labs and CT head. ?  ? ? ?FINAL CLINICAL IMPRESSION(S) / ED DIAGNOSES  ? ?Final diagnoses:  ?Altered mental status, unspecified altered mental status type  ?Alcohol intoxication with delirium (HCC)  ?Polysubstance use disorder  ?Abnormal CK  ?Troponin I above reference range  ? ? ? ?Rx / DC Orders  ? ?ED Discharge Orders   ? ? None  ? ?  ? ? ? ?Note:  This document was prepared using Dragon voice recognition software and may include unintentional dictation  errors. ?  ?Gilles Chiquito, MD ?02/11/22 0025 ? ?

## 2022-02-10 NOTE — ED Notes (Signed)
IVC 

## 2022-02-11 ENCOUNTER — Inpatient Hospital Stay: Payer: Self-pay

## 2022-02-11 ENCOUNTER — Inpatient Hospital Stay (HOSPITAL_COMMUNITY)
Admit: 2022-02-11 | Discharge: 2022-02-11 | Disposition: A | Discharge status: Home / Self Care | Payer: Self-pay | Attending: Internal Medicine | Admitting: Internal Medicine

## 2022-02-11 ENCOUNTER — Emergency Department: Payer: Self-pay

## 2022-02-11 ENCOUNTER — Other Ambulatory Visit: Payer: Self-pay

## 2022-02-11 DIAGNOSIS — T796XXD Traumatic ischemia of muscle, subsequent encounter: Secondary | ICD-10-CM

## 2022-02-11 DIAGNOSIS — M6282 Rhabdomyolysis: Secondary | ICD-10-CM | POA: Diagnosis present

## 2022-02-11 DIAGNOSIS — F1910 Other psychoactive substance abuse, uncomplicated: Secondary | ICD-10-CM

## 2022-02-11 DIAGNOSIS — I5030 Unspecified diastolic (congestive) heart failure: Secondary | ICD-10-CM | POA: Diagnosis present

## 2022-02-11 DIAGNOSIS — I25118 Atherosclerotic heart disease of native coronary artery with other forms of angina pectoris: Secondary | ICD-10-CM

## 2022-02-11 DIAGNOSIS — R778 Other specified abnormalities of plasma proteins: Secondary | ICD-10-CM

## 2022-02-11 DIAGNOSIS — I5032 Chronic diastolic (congestive) heart failure: Secondary | ICD-10-CM

## 2022-02-11 LAB — BLOOD GAS, VENOUS
Acid-Base Excess: 0.6 mmol/L (ref 0.0–2.0)
Bicarbonate: 27.4 mmol/L (ref 20.0–28.0)
O2 Saturation: 84.3 %
Patient temperature: 37
pCO2, Ven: 52 mmHg (ref 44–60)
pH, Ven: 7.33 (ref 7.25–7.43)
pO2, Ven: 53 mmHg — ABNORMAL HIGH (ref 32–45)

## 2022-02-11 LAB — CK
Total CK: 629 U/L — ABNORMAL HIGH (ref 49–397)
Total CK: 574 U/L — ABNORMAL HIGH (ref 49–397)

## 2022-02-11 LAB — HIV ANTIBODY (ROUTINE TESTING W REFLEX): HIV Screen 4th Generation wRfx: NONREACTIVE

## 2022-02-11 LAB — URINE DRUG SCREEN, QUALITATIVE (ARMC ONLY)
Amphetamines, Ur Screen: NOT DETECTED
Barbiturates, Ur Screen: NOT DETECTED
Benzodiazepine, Ur Scrn: NOT DETECTED
Cannabinoid 50 Ng, Ur ~~LOC~~: POSITIVE — AB
Cocaine Metabolite,Ur ~~LOC~~: POSITIVE — AB
MDMA (Ecstasy)Ur Screen: NOT DETECTED
Methadone Scn, Ur: NOT DETECTED
Opiate, Ur Screen: POSITIVE — AB
Phencyclidine (PCP) Ur S: NOT DETECTED
Tricyclic, Ur Screen: NOT DETECTED

## 2022-02-11 LAB — TROPONIN I (HIGH SENSITIVITY)
Troponin I (High Sensitivity): 65 ng/L — ABNORMAL HIGH (ref <18)
Troponin I (High Sensitivity): 32 ng/L — ABNORMAL HIGH (ref <18)
Troponin I (High Sensitivity): 24 ng/L — ABNORMAL HIGH (ref <18)

## 2022-02-11 LAB — ECHOCARDIOGRAM COMPLETE
AR max vel: 2.78 cm2
AV Area VTI: 2.34 cm2
AV Area mean vel: 2.42 cm2
AV Mean grad: 3 mmHg
AV Peak grad: 4.7 mmHg
Ao pk vel: 1.08 m/s
Area-P 1/2: 4.71 cm2
Height: 68 in
MV VTI: 2.73 cm2
S' Lateral: 3.5 cm
Weight: 3494.91 oz

## 2022-02-11 LAB — SALICYLATE LEVEL: Salicylate Lvl: <7 mg/dL — ABNORMAL LOW (ref 7.0–30.0)

## 2022-02-11 LAB — ACETAMINOPHEN LEVEL: Acetaminophen (Tylenol), Serum: <10 ug/mL — ABNORMAL LOW (ref 10–30)

## 2022-02-11 LAB — MAGNESIUM: Magnesium: 2.1 mg/dL (ref 1.7–2.4)

## 2022-02-11 MED ORDER — ADULT MULTIVITAMIN W/MINERALS CH
1.0000 | ORAL_TABLET | Freq: Every day | ORAL | Status: DC
  Administered 2022-02-11 – 2022-02-13 (×3): 1 via ORAL
  Filled 2022-02-11 (×3): qty 1

## 2022-02-11 MED ORDER — LORAZEPAM 2 MG PO TABS
0.0000 mg | ORAL_TABLET | Freq: Two times a day (BID) | ORAL | Status: DC
  Filled 2022-02-11: qty 1

## 2022-02-11 MED ORDER — THIAMINE HCL 100 MG PO TABS
100.0000 mg | ORAL_TABLET | Freq: Every day | ORAL | Status: DC
  Administered 2022-02-11 – 2022-02-13 (×3): 100 mg via ORAL
  Filled 2022-02-11 (×3): qty 1

## 2022-02-11 MED ORDER — LACTULOSE 10 GM/15ML PO SOLN
20.0000 g | Freq: Once | ORAL | Status: AC
  Administered 2022-02-11: 20 g via ORAL
  Filled 2022-02-11: qty 30

## 2022-02-11 MED ORDER — ONDANSETRON HCL 4 MG/2ML IJ SOLN
4.0000 mg | Freq: Four times a day (QID) | INTRAMUSCULAR | Status: DC | PRN
Start: 2022-02-11 — End: 2022-02-13

## 2022-02-11 MED ORDER — ALBUTEROL SULFATE (2.5 MG/3ML) 0.083% IN NEBU: 3.0000 mL | INHALATION_SOLUTION | Freq: Four times a day (QID) | RESPIRATORY_TRACT | Status: DC | PRN

## 2022-02-11 MED ORDER — LORAZEPAM 2 MG/ML IJ SOLN
1.0000 mg | INTRAMUSCULAR | Status: DC | PRN
  Administered 2022-02-12: 2 mg via INTRAVENOUS
  Filled 2022-02-11: qty 1

## 2022-02-11 MED ORDER — LOSARTAN POTASSIUM 50 MG PO TABS
100.0000 mg | ORAL_TABLET | Freq: Every day | ORAL | Status: DC
  Administered 2022-02-11 – 2022-02-13 (×3): 100 mg via ORAL
  Filled 2022-02-11 (×3): qty 2

## 2022-02-11 MED ORDER — ASPIRIN EC 81 MG PO TBEC
81.0000 mg | DELAYED_RELEASE_TABLET | Freq: Every day | ORAL | Status: DC
  Administered 2022-02-11 – 2022-02-13 (×3): 81 mg via ORAL
  Filled 2022-02-11 (×3): qty 1

## 2022-02-11 MED ORDER — ACETAMINOPHEN 325 MG PO TABS
650.0000 mg | ORAL_TABLET | Freq: Four times a day (QID) | ORAL | Status: DC | PRN
  Administered 2022-02-12: 650 mg via ORAL
  Filled 2022-02-11: qty 2

## 2022-02-11 MED ORDER — LORAZEPAM 2 MG PO TABS
0.0000 mg | ORAL_TABLET | Freq: Four times a day (QID) | ORAL | Status: AC
  Administered 2022-02-11: 4 mg via ORAL
  Administered 2022-02-11 – 2022-02-12 (×2): 2 mg via ORAL
  Filled 2022-02-11: qty 1
  Filled 2022-02-11: qty 2
  Filled 2022-02-11: qty 1

## 2022-02-11 MED ORDER — ENOXAPARIN SODIUM 60 MG/0.6ML IJ SOSY
0.5000 mg/kg | PREFILLED_SYRINGE | INTRAMUSCULAR | Status: DC
  Administered 2022-02-11 – 2022-02-12 (×2): 50 mg via SUBCUTANEOUS
  Filled 2022-02-11 (×2): qty 0.6

## 2022-02-11 MED ORDER — LACTATED RINGERS IV BOLUS
1000.0000 mL | Freq: Once | INTRAVENOUS | Status: AC
  Administered 2022-02-11: 1000 mL via INTRAVENOUS

## 2022-02-11 MED ORDER — NICOTINE 21 MG/24HR TD PT24
21.0000 mg | MEDICATED_PATCH | Freq: Every day | TRANSDERMAL | Status: DC
  Administered 2022-02-11 – 2022-02-13 (×3): 21 mg via TRANSDERMAL
  Filled 2022-02-11 (×3): qty 1

## 2022-02-11 MED ORDER — NITROGLYCERIN 0.4 MG SL SUBL
0.4000 mg | SUBLINGUAL_TABLET | SUBLINGUAL | Status: DC | PRN
  Administered 2022-02-12 (×3): 0.4 mg via SUBLINGUAL
  Filled 2022-02-11: qty 1

## 2022-02-11 MED ORDER — ACETAMINOPHEN 650 MG RE SUPP
650.0000 mg | Freq: Four times a day (QID) | RECTAL | Status: DC | PRN
Start: 2022-02-11 — End: 2022-02-13

## 2022-02-11 MED ORDER — CARVEDILOL 25 MG PO TABS
25.0000 mg | ORAL_TABLET | Freq: Two times a day (BID) | ORAL | Status: DC
  Administered 2022-02-11 – 2022-02-13 (×5): 25 mg via ORAL
  Filled 2022-02-11 (×5): qty 1

## 2022-02-11 MED ORDER — SODIUM CHLORIDE 0.9 % IV SOLN: INTRAVENOUS | Status: DC

## 2022-02-11 MED ORDER — ONDANSETRON HCL 4 MG PO TABS: 4.0000 mg | ORAL_TABLET | Freq: Four times a day (QID) | ORAL | Status: DC | PRN

## 2022-02-11 MED ORDER — THIAMINE HCL 100 MG/ML IJ SOLN
100.0000 mg | Freq: Every day | INTRAMUSCULAR | Status: DC
  Filled 2022-02-11 (×2): qty 2

## 2022-02-11 MED ORDER — FOLIC ACID 1 MG PO TABS
1.0000 mg | ORAL_TABLET | Freq: Every day | ORAL | Status: DC
  Administered 2022-02-11 – 2022-02-13 (×3): 1 mg via ORAL
  Filled 2022-02-11 (×3): qty 1

## 2022-02-11 MED ORDER — LORAZEPAM 1 MG PO TABS: 1.0000 mg | ORAL_TABLET | ORAL | Status: DC | PRN

## 2022-02-11 NOTE — ED Notes (Addendum)
Pt transported to room 148 ?

## 2022-02-11 NOTE — ED Notes (Signed)
Pt return from CT. While reconnecting to the monitor, pt desat into the 80's, 2L/O2 placed on patient, sats maintained at 95-96%. ?

## 2022-02-11 NOTE — Consult Note (Signed)
[facility] Face-to-Face Psychiatry Consult  ? ?Reason for Consult:  polysubstance abuse ?Referring Physician:  Agbata ?Patient Identification: Marcus Bauer ?MRN:  [id number] ?Principal Diagnosis: Polysubstance abuse ([place]) ?Diagnosis:  Principal Problem: ?  Polysubstance abuse ([place]) ?Active Problems: ?  Essential hypertension ?  Asthma without status asthmaticus ?  Coronary artery disease of native artery of native heart with stable angina pectoris ([place]) ?  Rhabdomyolysis ?  Elevated troponin ?  (HFpEF) heart failure with preserved ejection fraction ([place]) ? ? ?Total Time spent with patient: 45 minutes ? ?Subjective:   ?Marcus Bauer is a 41 y.o. male patient admitted with drug overdose and AMS. UDS positive for cocaine, cannabinoids, opiates ? ?HPI: Patient is admitted to medicine for stabilization after polysubstance use.  Consult for psychiatric evaluation was requested. ?Upon entering the room, the patient is very sleepy.  He is calm and cooperative.  He is reminded to remain alert a few times throughout the interview but his speech is clear, coherent.  He is alert to person place and approximate time.  He states that he does not remember what happened yesterday, prior to his presentation to the ED.  He does tell this Probation officer that he was released from drug rehabilitation treatment at [facility] in [place] last week. Marcus Bauer asked what substances he uses, patient's admits to using cocaine, stating that he does not use "that much."  He states that he uses once or twice a week.  Patient reports that he lives with his wife in 2 children in [place], [place].  Writer asked patient 3 times throughout the interview if this drug overdose was intentional or if he wanted to end his life.  Patient stated all 3 times that he did not want to die and then drug use is not to end his life.  He certainly does seem to be minimizing the drug use, as he states that it is "not a problem" in his life. ? ?Patient is  ill-appearing and will be admitted to the medical floor for treatment.  IVC was released, as patient is stating that he will stay for medical treatment and is not threatening to leave at this time.  Writer spoke with patient about his minimizing his drug use and how this is affecting his life and family.  Patient does not meet criteria for inpatient psychiatric treatment. ? ?Past Psychiatric History: None per chart review ? ?Risk to Self:   ?Risk to Others:   ?Prior Inpatient Therapy:   ?Prior Outpatient Therapy:   ? ?Past Medical History:  ?Past Medical History:  ?Diagnosis Date  ? (HFpEF) heart failure with preserved ejection fraction ([place])   ? a 07/2019 Echo: EF 60-65%. No signif valvular dzs; b. 07/2019 Cath: LVEDP 30-31mmHg.  ? Anxiety   ? Asthma   ? CAD (coronary artery disease)   ? a. 07/2019 NSTEMI/Cath: LM nl, LAD nl, D1/2/3 nl, LCX nl, OM1 60, OM2/3 nl, RCA large, nl, RPDA 100 w/ L->R collats. EF 50-55%.  ? GERD (gastroesophageal reflux disease)   ? Hypertension   ? MI (myocardial infarction) ([place])   ? Sleep apnea   ? a. CPAP noncompliance.  ?  ?Past Surgical History:  ?Procedure Laterality Date  ? CARPOMETACARPAL (CMC) FUSION OF THUMB Left 12/20/2018  ? Procedure: REPAIR OF THE ULNAR COLLATERAL LIGAMENT OF THUMB;  Surgeon: Corky Mull, MD;  Location: [facility] ORS;  Service: Orthopedics;  Laterality: Left;  ARTHREX SMALL JOINT INTERNAL BRACE KIT  ? LEFT HEART CATH AND  CORONARY ANGIOGRAPHY N/A 08/13/2019  ? Procedure: LEFT HEART CATH AND CORONARY ANGIOGRAPHY;  Surgeon: Nelva Bush, MD;  Location: [place] [facility];  Service: Cardiovascular;  Laterality: N/A;  ? SHOULDER SURGERY Left   ? ?Family History:  ?Family History  ?Problem Relation Age of Onset  ? Heart disease Mother   ? Hypertension Mother   ? Hyperlipidemia Mother   ? Hypertension Father   ? Hyperlipidemia Father   ? ?Family Psychiatric  History: Unknown ?Social History:  ?Social History  ? ?Substance and Sexual Activity  ?Alcohol Use Yes  ?  Comment: OCC-weekends, more than 6/day  ?   ?Social History  ? ?Substance and Sexual Activity  ?Drug Use Never  ?  ?Social History  ? ?Socioeconomic History  ? Marital status: Married  ?  Spouse name: Not on file  ? Number of children: Not on file  ? Years of education: Not on file  ? Highest education level: Not on file  ?Occupational History  ? Not on file  ?Tobacco Use  ? Smoking status: Every Day  ?  Packs/day: 0.50  ?  Years: 18.00  ?  Pack years: 9.00  ?  Types: Cigarettes  ? Smokeless tobacco: Current  ?  Types: Chew  ? Tobacco comments:  ?  down to 1/4 pack a day 08/25/2019  ?Vaping Use  ? Vaping Use: Never used  ?Substance and Sexual Activity  ? Alcohol use: Yes  ?  Comment: OCC-weekends, more than 6/day  ? Drug use: Never  ? Sexual activity: Yes  ?  Partners: Female  ?Other Topics Concern  ? Not on file  ?Social History Narrative  ? Not on file  ? ?Social Determinants of Health  ? ?Financial Resource Strain: Not on file  ?Food Insecurity: Not on file  ?Transportation Needs: Not on file  ?Physical Activity: Not on file  ?Stress: Not on file  ?Social Connections: Not on file  ? ?Additional Social History: ?  ? ?Allergies:   ?Allergies  ?Allergen Reactions  ? Shellfish Allergy Shortness Of Breath  ? Hydrochlorothiazide Other (See Comments)  ?  Intolerance - malaise  ? Other Hives  ?  CLOROX  ? ? ?Labs:  ?Results for orders placed or performed during the hospital encounter of 02/10/22 (from the past 48 hour(s))  ?Blood gas, venous     Status: Abnormal  ? Collection Time: 02/10/22  8:18 PM  ?Result Value Ref Range  ? pH, Ven 7.23 (L) 7.25 - 7.43  ? pCO2, Ven 61 (H) 44 - 60 mmHg  ? pO2, Ven 48 (H) 32 - 45 mmHg  ? Bicarbonate 25.5 20.0 - 28.0 mmol/L  ? Acid-base deficit 3.2 (H) 0.0 - 2.0 mmol/L  ? O2 Saturation 71.3 %  ? Patient temperature 37.0   ? Collection site VEIN   ?  Comment: Performed at [facility], [address] [place], [place] [address]  ?CBC with Differential     Status: None  ?  Collection Time: 02/10/22  8:32 PM  ?Result Value Ref Range  ? WBC 8.4 4.0 - 10.5 K/uL  ? RBC 5.25 4.22 - 5.81 MIL/uL  ? Hemoglobin 14.4 13.0 - 17.0 g/dL  ? HCT 44.6 39.0 - 52.0 %  ? MCV 85.0 80.0 - 100.0 fL  ? MCH 27.4 26.0 - 34.0 pg  ? MCHC 32.3 30.0 - 36.0 g/dL  ? RDW 15.4 11.5 - 15.5 %  ? Platelets 191 150 - 400 K/uL  ? nRBC 0.0  0.0 - 0.2 %  ? Neutrophils Relative % 55 %  ? Neutro Abs 4.8 1.7 - 7.7 K/uL  ? Lymphocytes Relative 34 %  ? Lymphs Abs 2.9 0.7 - 4.0 K/uL  ? Monocytes Relative 8 %  ? Monocytes Absolute 0.7 0.1 - 1.0 K/uL  ? Eosinophils Relative 1 %  ? Eosinophils Absolute 0.0 0.0 - 0.5 K/uL  ? Basophils Relative 1 %  ? Basophils Absolute 0.1 0.0 - 0.1 K/uL  ? Immature Granulocytes 1 %  ? Abs Immature Granulocytes 0.05 0.00 - 0.07 K/uL  ?  Comment: Performed at [facility], [address] [place], [place] [address]  ?Comprehensive metabolic panel     Status: Abnormal  ? Collection Time: 02/10/22  8:32 PM  ?Result Value Ref Range  ? Sodium 139 135 - 145 mmol/L  ? Potassium 3.6 3.5 - 5.1 mmol/L  ? Chloride 100 98 - 111 mmol/L  ? CO2 25 22 - 32 mmol/L  ? Glucose, Bld 102 (H) 70 - 99 mg/dL  ?  Comment: Glucose reference range applies only to samples taken after fasting for at least 8 hours.  ? BUN 13 6 - 20 mg/dL  ? Creatinine, Ser 1.41 (H) 0.61 - 1.24 mg/dL  ? Calcium 8.6 (L) 8.9 - 10.3 mg/dL  ? Total Protein 7.9 6.5 - 8.1 g/dL  ? Albumin 4.0 3.5 - 5.0 g/dL  ? AST 45 (H) 15 - 41 U/L  ? ALT 29 0 - 44 U/L  ? Alkaline Phosphatase 52 38 - 126 U/L  ? Total Bilirubin 0.8 0.3 - 1.2 mg/dL  ? GFR, Estimated >60 >60 mL/min  ?  Comment: (NOTE) ?Calculated using the CKD-EPI Creatinine Equation (2021) ?  ? Anion gap 14 5 - 15  ?  Comment: Performed at [facility], [address], [place], [place] [address]  ?Resp Panel by RT-PCR (Flu A&B, Covid) Nasopharyngeal Swab     Status: None  ? Collection Time: 02/10/22  8:32 PM  ? Specimen: Nasopharyngeal Swab; Nasopharyngeal(NP) swabs in vial transport  medium  ?Result Value Ref Range  ? SARS Coronavirus 2 by RT PCR NEGATIVE NEGATIVE  ?  Comment: (NOTE) ?SARS-CoV-2 target nucleic acids are NOT DETECTED. ? ?The SARS-CoV-2 RNA is generally detectable in upper respiratory ?s

## 2022-02-11 NOTE — ED Notes (Signed)
IVC PAPERS  RESCINDED  PER  LOUISE  BARTHOLD NP  INFORMED  RN KATIE ?

## 2022-02-11 NOTE — ED Provider Notes (Signed)
Please note, once physical restraints were applied they were maintained for 4 hours.  ?  ?Lucrezia Starch, MD ?02/11/22 1453 ? ?

## 2022-02-11 NOTE — Progress Notes (Signed)
*  PRELIMINARY RESULTS* ?Echocardiogram ?2D Echocardiogram has been performed. ? ?Anayansi Rundquist, Dorene Sorrow ?02/11/2022, 11:20 AM ?

## 2022-02-11 NOTE — ED Notes (Addendum)
Pt states that his R elbow is hurting and he can't lift anything up with this arm. Denies any pain in his R shoulder or wrist. Dr. Francine Graven, MD notified. States that he does not remember anything that happened last night so he cannot recall events. Explained to pt why he came in and the events leading to then. Pt verbalized understanding.  ?

## 2022-02-11 NOTE — ED Notes (Signed)
Breakfast at bedside at this time. Sitter at bedside at this time to assist pt with breakfast tray.  ?

## 2022-02-11 NOTE — Assessment & Plan Note (Signed)
Treatment as outlined in 2 

## 2022-02-11 NOTE — ED Notes (Signed)
Dr. Agbata to bedside. 

## 2022-02-11 NOTE — Assessment & Plan Note (Addendum)
Patient noted to have elevated troponin levels initially which may be secondary to rhabdomyolysis but were concerning in this patient who has a known history of coronary artery disease. ?Continue aspirin, carvedilol and Lovenox ?Hold atorvastatin due to rhabdomyolysis, can restart on discharge  ?Cycle cardiac enzymes were ok ?Obtain 2D echocardiogram to assess LVEF rule out regional wall motion abnormality - no concerns ?

## 2022-02-11 NOTE — Assessment & Plan Note (Signed)
Continue carvedilol and Cozaar ?

## 2022-02-11 NOTE — ED Notes (Signed)
Psych NP at bedside

## 2022-02-11 NOTE — Assessment & Plan Note (Addendum)
Patient has a history of polysubstance abuse which includes use of crack cocaine, marijuana, opioids, nicotine and alcohol. ?Patient has been counseled on the need to abstain from illicit drug use ?

## 2022-02-11 NOTE — ED Notes (Signed)
1 bag of belongings include;  ?1 black wallet  ?1 silver colored chain ?1 black phone ?1 pair of green boxers  ?1 pair of navy blue  ?

## 2022-02-11 NOTE — ED Notes (Signed)
IVC pending consult   

## 2022-02-11 NOTE — Assessment & Plan Note (Addendum)
Patient noted to have elevated total CK levels related to agitation and possibly to lying unconscious ?Continue IV fluid hydration ?Repeat CK levels in a.m. trending down ?Pt tolerating po  ?

## 2022-02-11 NOTE — H&P (Signed)
?History and Physical  ? ? ?Patient: Marcus Bauer LKJ:[id number] DOB: [birth year] ?DOA: 02/10/2022 ?DOS: the patient was seen and examined on 02/11/2022 ?PCP: Dion Body, MD  ?Patient coming from: Home ? ?Chief Complaint:  ?Chief Complaint  ?Patient presents with  ? Ingestion  ? ?HPI: Marcus Bauer is a 41 y.o. male with medical history significant for coronary artery disease, hypertension, asthma, chronic diastolic dysfunction CHF who was brought into the ER after being found unconscious in a motel. ?Per EMS patient had ingested crack cocaine and some alcohol.  They administered intranasal Narcan 6 mg and 1 mg IV and patient became more awake, combative and unable to follow commands when he arrived to ER. ?In the ER he received a dose of Ativan 2 mg IM and Geodon 20 mg for his agitation. ?During my evaluation patient is more awake and alert though a little drowsy.  He does not remember the events leading to him coming to the hospital and complains of some pain in his right elbow and forehead. ?He admits to cocaine, marijuana and alcohol use. ?He denies having any chest pain, no shortness of breath, no nausea, no vomiting, no dizziness, no lightheadedness, no blurred vision or any focal deficit. ?He will be admitted to the hospital for management of rhabdomyolysis ?Review of Systems: As mentioned in the history of present illness. All other systems reviewed and are negative. ?Past Medical History:  ?Diagnosis Date  ? (HFpEF) heart failure with preserved ejection fraction ([place])   ? a 07/2019 Echo: EF 60-65%. No signif valvular dzs; b. 07/2019 Cath: LVEDP 30-66mHg.  ? Anxiety   ? Asthma   ? CAD (coronary artery disease)   ? a. 07/2019 NSTEMI/Cath: LM nl, LAD nl, D1/2/3 nl, LCX nl, OM1 60, OM2/3 nl, RCA large, nl, RPDA 100 w/ L->R collats. EF 50-55%.  ? GERD (gastroesophageal reflux disease)   ? Hypertension   ? [place]   ? Sleep apnea   ? a. CPAP noncompliance.  ? ?Past Surgical  History:  ?Procedure Laterality Date  ? CARPOMETACARPAL (CMC) FUSION OF THUMB Left 12/20/2018  ? Procedure: REPAIR OF THE ULNAR COLLATERAL LIGAMENT OF THUMB;  Surgeon: PCorky Mull MD;  Location: [facility] ORS;  Service: Orthopedics;  Laterality: Left;  ARTHREX SMALL JOINT INTERNAL BRACE KIT  ? LEFT HEART CATH AND CORONARY ANGIOGRAPHY N/A 08/13/2019  ? Procedure: LEFT HEART CATH AND CORONARY ANGIOGRAPHY;  Surgeon: ENelva Bush MD;  [facility];  Service: Cardiovascular;  Laterality: N/A;  ? SHOULDER SURGERY Left   ? ?Social History:  reports that he has been smoking cigarettes. He has a 9.00 pack-year smoking history. His smokeless tobacco use includes chew. He reports current alcohol use. He reports that he does not use drugs. ? ?Allergies  ?Allergen Reactions  ? Shellfish Allergy Shortness Of Breath  ? Hydrochlorothiazide Other (See Comments)  ?  Intolerance - malaise  ? Other Hives  ?  CLOROX  ? ? ?Family History  ?Problem Relation Age of Onset  ? Heart disease Mother   ? Hypertension Mother   ? Hyperlipidemia Mother   ? Hypertension Father   ? Hyperlipidemia Father   ? ? ?Prior to Admission medications   ?Medication Sig Start Date End Date Taking? Authorizing Provider  ?albuterol (PROVENTIL HFA;VENTOLIN HFA) 108 (90 Base) MCG/ACT inhaler Inhale 2 puffs into the lungs every 6 (six) hours as needed for wheezing or shortness of breath. ?Patient not taking: Reported on 02/11/2022  [provider]  ?amLODipine (NORVASC) 5 MG tablet Take 1 tablet (5 mg total) by mouth daily. ?Patient not taking: Reported on 02/11/2022 01/23/22 04/23/22  Paduchowski, Kevin, MD  ?amoxicillin (AMOXIL) 875 MG tablet Take 1 tablet (875 mg total) by mouth 2 (two) times daily. ?Patient not taking: Reported on 02/11/2022 02/11/21   Stafford, Phillip, MD  ?aspirin 81 MG EC tablet Take 1 tablet (81 mg total) by mouth daily. ?Patient not taking: Reported on 02/11/2022 01/23/22   Paduchowski, Kevin, MD  ?atorvastatin (LIPITOR)  80 MG tablet Take 1 tablet (80 mg total) by mouth daily at 6 PM. ?Patient not taking: Reported on 02/11/2022 01/23/22   Paduchowski, Kevin, MD  ?carvedilol (COREG) 25 MG tablet Take 1 tablet (25 mg total) by mouth 2 (two) times daily. ?Patient not taking: Reported on 02/11/2022 01/23/22 01/23/23  Paduchowski, Kevin, MD  ?losartan (COZAAR) 50 MG tablet Take 2 tablets (100 mg total) by mouth daily. ?Patient not taking: Reported on 02/11/2022 01/23/22 01/23/23  Paduchowski, Kevin, MD  ?naproxen (NAPROSYN) 500 MG tablet Take 1 tablet (500 mg total) by mouth 2 (two) times daily with a meal. ?Patient not taking: Reported on 02/11/2022 02/11/21   Stafford, Phillip, MD  ?nitroGLYCERIN (NITROSTAT) 0.4 MG SL tablet Place 1 tablet (0.4 mg total) under the tongue every 5 (five) minutes x 3 doses as needed for chest pain. ?Patient not taking: Reported on 02/11/2022 01/23/22   Paduchowski, Kevin, MD  ?traMADol (ULTRAM) 50 MG tablet Take 1 tablet (50 mg total) by mouth every 6 (six) hours as needed for severe pain. ?Patient not taking: Reported on 02/11/2022 02/11/21   Stafford, Phillip, MD  ? ? ?Physical Exam: ?Vitals:  ? 02/11/22 0430 02/11/22 0445 02/11/22 0500 02/11/22 0625  ?BP: (!) 149/83 137/82 (!) 146/104 139/82  ?Pulse: 86 88 76 76  ?Resp: 16 17 16 19  ?Temp:      ?TempSrc:      ?SpO2: 90% 92% 95% 100%  ?Weight:      ?Height:      ? ?Physical Exam ?Vitals and nursing note reviewed.  ?Constitutional:   ?   Comments: Awake, drowsy but oriented to person place and time  ?HENT:  ?   Head: Normocephalic and atraumatic.  ?   Nose: Nose normal.  ?   Mouth/Throat:  ?   Mouth: Mucous membranes are moist.  ?Eyes:  ?   Pupils: Pupils are equal, round, and reactive to light.  ?Cardiovascular:  ?   Rate and Rhythm: Normal rate and regular rhythm.  ?Pulmonary:  ?   Effort: Pulmonary effort is normal.  ?   Breath sounds: Normal breath sounds.  ?Abdominal:  ?   General: Abdomen is flat.  ?   Palpations: Abdomen is soft.  ?Musculoskeletal:     ?   General:  Swelling present.  ?   Cervical back: Normal range of motion and neck supple.  ?   Comments: Right elbow  ?Skin: ?   General: Skin is warm and dry.  ?Neurological:  ?   General: No focal deficit present.  ?Psychiatric:     ?   Mood and Affect: Mood normal.     ?   Behavior: Behavior normal.  ? ? ?Data Reviewed:  ?Relevant notes from primary care and specialist visits, past discharge summaries as available in EHR, including Care Everywhere. ?Prior diagnostic testing as pertinent to current admission diagnoses ?Updated medications and problem lists for reconciliation ?ED course, including vitals, labs, imaging, treatment and   response to treatment ?Triage notes, nursing and pharmacy notes and ED provider's notes ?Notable results as noted in HPI ?Labs reviewed.  Urine drug screen positive for cocaine, opiates and cannabinoids.  Total CK6 29, troponin 20 >> 65, alcohol level 106, sodium 139, potassium 3.6, chloride 100, bicarb 25, glucose 102, BUN 13, creatinine 1.41, calcium 8.6, total protein 7.9, albumin 4.0, AST 45, ALT 29, alkaline phosphatase 52, total bilirubin 0.8, white count 8.4, hemoglobin 14.4, hematocrit 44.6, platelet count 191 ?CT scan of the head without contrast is negative for any acute process ?Twelve-lead EKG reviewed by me shows sinus rhythm with left atrial enlargement ?There are no new results to review at this time. ? ?Assessment and Plan: ?* Rhabdomyolysis ?Patient noted to have elevated total CK levels related to agitation ?Continue IV fluid hydration ?Repeat CK levels in a.m. ? ?Elevated troponin ?Patient noted to have elevated troponin levels which may be secondary to rhabdomyolysis but concerning in this patient who has a known history of coronary artery disease. ?Continue aspirin, carvedilol and Lovenox ?Hold atorvastatin due to rhabdomyolysis ?Cycle cardiac enzymes ?Obtain 2D echocardiogram to assess LVEF rule out regional wall motion abnormality ? ?Coronary artery disease of native artery  of native heart with stable angina pectoris (HCC) ?Treatment as outlined in 2 ? ?Essential hypertension ?Continue carvedilol and Cozaar ? ?Polysubstance abuse (HCC) ?Patient has a history of polysubstance

## 2022-02-11 NOTE — Assessment & Plan Note (Addendum)
Stable and not acutely exacerbated ?Last known LVEF of 60 to 65% from a 2D echocardiogram which was done in 2020. ?Continue carvedilol and Cozaar ?No concerns on 2D echocardiogram ?

## 2022-02-11 NOTE — Plan of Care (Signed)

## 2022-02-11 NOTE — Assessment & Plan Note (Signed)
Stable ?Not acutely exacerbated ?Continue as needed bronchodilator therapy ?

## 2022-02-11 NOTE — Progress Notes (Signed)
Admission profile updated. ?

## 2022-02-12 LAB — BASIC METABOLIC PANEL
Anion gap: 4 — ABNORMAL LOW (ref 5–15)
BUN: 12 mg/dL (ref 6–20)
CO2: 28 mmol/L (ref 22–32)
Calcium: 8.6 mg/dL — ABNORMAL LOW (ref 8.9–10.3)
Chloride: 105 mmol/L (ref 98–111)
Creatinine, Ser: 1.05 mg/dL (ref 0.61–1.24)
GFR, Estimated: >60 mL/min (ref >60)
Glucose, Bld: 100 mg/dL — ABNORMAL HIGH (ref 70–99)
Potassium: 3.7 mmol/L (ref 3.5–5.1)
Sodium: 137 mmol/L (ref 135–145)

## 2022-02-12 LAB — CBC
HCT: 41.4 % (ref 39.0–52.0)
Hemoglobin: 13.7 g/dL (ref 13.0–17.0)
MCH: 27.4 pg (ref 26.0–34.0)
MCHC: 33.1 g/dL (ref 30.0–36.0)
MCV: 82.8 fL (ref 80.0–100.0)
Platelets: 190 10*3/uL (ref 150–400)
RBC: 5 MIL/uL (ref 4.22–5.81)
RDW: 15.1 % (ref 11.5–15.5)
WBC: 9.6 10*3/uL (ref 4.0–10.5)
nRBC: 0 % (ref 0.0–0.2)

## 2022-02-12 LAB — CK
Total CK: 426 U/L — ABNORMAL HIGH (ref 49–397)
Total CK: 354 U/L (ref 49–397)
Total CK: 350 U/L (ref 49–397)

## 2022-02-12 LAB — TROPONIN I (HIGH SENSITIVITY)
Troponin I (High Sensitivity): 11 ng/L (ref <18)
Troponin I (High Sensitivity): 12 ng/L (ref <18)

## 2022-02-12 MED ORDER — TRAZODONE HCL 50 MG PO TABS
50.0000 mg | ORAL_TABLET | Freq: Every evening | ORAL | Status: DC | PRN
  Administered 2022-02-12: 50 mg via ORAL
  Filled 2022-02-12: qty 1

## 2022-02-12 MED ORDER — MORPHINE SULFATE (PF) 2 MG/ML IV SOLN
2.0000 mg | Freq: Once | INTRAVENOUS | Status: AC
  Administered 2022-02-12: 2 mg via INTRAVENOUS
  Filled 2022-02-12: qty 1

## 2022-02-12 NOTE — Assessment & Plan Note (Addendum)
See nurse notes, RRT called for chest pain today ?Pt given nitro x3, ativan, morphine  ?EKG stable from yesterday ?Tropes trending down from yesterday, CK also trending down ?

## 2022-02-12 NOTE — Progress Notes (Signed)
Met with the patient in the room ?He lives at home with his wife ?He has a PCP and cardiologist ?He has no insurance but states he can afford his medication ?He drives and is independent at home ?He was provided [employer], [facility] application, and Drug abuse resources ?He stated he has no additional beeds ?

## 2022-02-12 NOTE — Progress Notes (Addendum)
1230 ?Nurse made aware that pt is having chest pain. That is radiating to left jaw and back. Has had a heart attack in 2020 before. ? ?1240 ?Nurse in room called RRT. Informed that x3 nitro tabs were given. 12 lead EKG completed and Physician made aware of onset chest. ? ?1242 ?Verbal orders for STAT CK and Troponin. Hospitalist ok with EKG for now, not much change to yesterday EKG. ? ?1344 ?Lab into draw ck and troponin at this time ? ? ?

## 2022-02-12 NOTE — Progress Notes (Signed)
?Progress Note ? ? ?Patient: Marcus Bauer [id number] DOB: 10/17/1981 DOA: 02/10/2022     1 ?DOS: the patient was seen and examined on 02/12/2022 ?  ? ? ?Brief hospital course: ? ?41 year old male with medical history significant for CAD, HTN, asthma, chronic diastolic dysfunction CHF.  Brought to ED 02/10/2022 after being found unconscious in a motel, per EMS concern for ingesting crack cocaine, alcohol.  Patient was given Narcan and became more awake, combative, unable to follow commands.  Arrived in the ED, received Ativan, Geodon.  Was awake and cooperative by time admitting physician saw him on 02/11/2022, he stated he does not remember how he got to the hospital.  Admitted to cocaine, marijuana, alcohol use.  Denied chest pain, SOB, nausea, vomiting, dizziness.  Was complaining of muscle soreness, found to be in rhabdomyolysis.  On morning of 02/12/2022, CK and troponins were trending down.  Around noon, he had episode of chest pain and EKG was stable, troponins and CK still trending down, patient responded well to nitro, morphine, Ativan. ? ?Consultants: ?Psychiatry 02/11/2022 to evaluate for inpatient psychiatric hospitalization, he does not meet criteria ? ?Procedures and significant results: ?CK and troponin on initial presentation to the ED, troponin was 65, this trended down to 32 then down to 24.  Total CK was 629, this trended down to 574, 426 this morning, repeated at this afternoon when patient was having an episode of chest pain and CK was 354, troponin also drawn at that time and was down to 11 (from 24 recently and 65 on initial presentation to ED).  ?CT head no acute abnormality ?Echocardiogram EF 50 to 55%, no regional wall motion abnormalities, mild LVH ? ? ? ?Assessment and Plan: ? ?* Polysubstance abuse ([place]) ?Patient has a history of polysubstance abuse which includes use of crack cocaine, marijuana, opioids, nicotine and alcohol. ?Patient has been counseled on the need to abstain from  illicit drug use ?We will place him CIWA protocol and administer lorazepam for CIWA score of 8 or greater ?He declines a nicotine patch at this time ? ?Rhabdomyolysis ?Patient noted to have elevated total CK levels related to agitation ?Continue IV fluid hydration ?Repeat CK levels in a.m. ? ?Elevated troponin ?Patient noted to have elevated troponin levels which may be secondary to rhabdomyolysis but concerning in this patient who has a known history of coronary artery disease. ?Continue aspirin, carvedilol and Lovenox ?Hold atorvastatin due to rhabdomyolysis ?Cycle cardiac enzymes ?Obtain 2D echocardiogram to assess LVEF rule out regional wall motion abnormality ? ?Coronary artery disease of native artery of native heart with stable angina pectoris ([place]) ?Treatment as outlined in 2 ? ?Essential hypertension ?Continue carvedilol and Cozaar ? ?(HFpEF) heart failure with preserved ejection fraction ([place]) ?Stable and not acutely exacerbated ?Last known LVEF of 60 to 65% from a 2D echocardiogram which was done in 2020. ?Continue carvedilol and Cozaar ?Follow-up results of repeat 2D echocardiogram ? ?Asthma without status asthmaticus ?Stable.  Not acutely exacerbated ?Continue as needed bronchodilator therapy ? ? ? ? ?  ? ?Subjective: Patient seen and examined resting in bed on medical floor, no apparent distress.  Confirmed the above history, he states that he is not a habitual user of the substances noted above, does not typically drink alcohol, however he is complaining of some sweats overnight, some anxiety. ? ?Physical Exam: ?Vitals:  ? 02/12/22 1236 02/12/22 1240 02/12/22 1346 02/12/22 1502  ?BP: (!) 140/112 (!) 147/106 116/76 (!) 136/100  ?Pulse: 66 77 64 65  ?  Resp: 16  20 18   ?Temp:   98.1 ?F (36.7 ?C) 98.2 ?F (36.8 ?C)  ?TempSrc:   Axillary   ?SpO2: 99% 99% 100% 100%  ?Weight:      ?Height:      ? ?Constitutional:  ?VSS, see nurse notes ?General Appearance: alert, well-developed, well-nourished,  NAD ?Respiratory: ?Normal respiratory effort ?Breath sounds normal, no wheeze/rhonchi/rales ?Cardiovascular: ?S1/S2 normal, no murmur/rub/gallop auscultated ?No lower extremity edema ?Gastrointestinal: ?Nontender, no masses ?No hepatomegaly, no splenomegaly ?No hernia appreciated ?Musculoskeletal:  ?No clubbing/cyanosis of digits ?Neurological: ?No cranial nerve deficit on limited exam ?Motor and sensation intact and symmetric ?Psychiatric: ?Normal judgment/insight ?Normal mood and affect ? ?Data Reviewed: ?Results for orders placed or performed during the hospital encounter of 02/10/22 (from the past 24 hour(s))  ?Troponin I (High Sensitivity)     Status: Abnormal  ? Collection Time: 02/11/22  5:42 PM  ?Result Value Ref Range  ? Troponin I (High Sensitivity) 24 (H) <18 ng/L  ?CBC     Status: None  ? Collection Time: 02/12/22  6:25 AM  ?Result Value Ref Range  ? WBC 9.6 4.0 - 10.5 K/uL  ? RBC 5.00 4.22 - 5.81 MIL/uL  ? Hemoglobin 13.7 13.0 - 17.0 g/dL  ? HCT 41.4 39.0 - 52.0 %  ? MCV 82.8 80.0 - 100.0 fL  ? MCH 27.4 26.0 - 34.0 pg  ? MCHC 33.1 30.0 - 36.0 g/dL  ? RDW 15.1 11.5 - 15.5 %  ? Platelets 190 150 - 400 K/uL  ? nRBC 0.0 0.0 - 0.2 %  ?Basic metabolic panel     Status: Abnormal  ? Collection Time: 02/12/22  6:25 AM  ?Result Value Ref Range  ? Sodium 137 135 - 145 mmol/L  ? Potassium 3.7 3.5 - 5.1 mmol/L  ? Chloride 105 98 - 111 mmol/L  ? CO2 28 22 - 32 mmol/L  ? Glucose, Bld 100 (H) 70 - 99 mg/dL  ? BUN 12 6 - 20 mg/dL  ? Creatinine, Ser 1.05 0.61 - 1.24 mg/dL  ? Calcium 8.6 (L) 8.9 - 10.3 mg/dL  ? GFR, Estimated >60 >60 mL/min  ? Anion gap 4 (L) 5 - 15  ?CK     Status: Abnormal  ? Collection Time: 02/12/22  6:25 AM  ?Result Value Ref Range  ? Total CK 426 (H) 49 - 397 U/L  ?CK     Status: None  ? Collection Time: 02/12/22  1:45 PM  ?Result Value Ref Range  ? Total CK 354 49 - 397 U/L  ?Troponin I (High Sensitivity)     Status: None  ? Collection Time: 02/12/22  1:45 PM  ?Result Value Ref Range  ? Troponin I  (High Sensitivity) 11 <18 ng/L  ? ? ?Family Communication: pt declined call to family/support person(s) at this time  ? ?Disposition: ?Status is: Inpatient ?Remains inpatient appropriate because continued medical workup  ? Planned Discharge Destination: Home ? ? ? ? ?Author: ?Emeterio Reeve, DO ?02/12/2022 3:22 PM ? ?For on call review [email or web address].  ?

## 2022-02-12 NOTE — Progress Notes (Signed)
Cross Cover ?Trazodone ordered as needed at bedtime for a request for sleep aid ?

## 2022-02-13 LAB — BASIC METABOLIC PANEL
Anion gap: 6 (ref 5–15)
BUN: 11 mg/dL (ref 6–20)
CO2: 29 mmol/L (ref 22–32)
Calcium: 9 mg/dL (ref 8.9–10.3)
Chloride: 101 mmol/L (ref 98–111)
Creatinine, Ser: 1.25 mg/dL — ABNORMAL HIGH (ref 0.61–1.24)
GFR, Estimated: >60 mL/min (ref >60)
Glucose, Bld: 112 mg/dL — ABNORMAL HIGH (ref 70–99)
Potassium: 3.8 mmol/L (ref 3.5–5.1)
Sodium: 136 mmol/L (ref 135–145)

## 2022-02-13 LAB — CK: Total CK: 254 U/L (ref 49–397)

## 2022-02-13 MED ORDER — THIAMINE HCL 100 MG PO TABS: 100.0000 mg | ORAL_TABLET | Freq: Every day | ORAL | 0 refills | Status: AC

## 2022-02-13 MED ORDER — TRAZODONE HCL 50 MG PO TABS
50.0000 mg | ORAL_TABLET | Freq: Every evening | ORAL | 0 refills | Status: AC | PRN
Start: 2022-02-13 — End: ?

## 2022-02-13 NOTE — Discharge Summary (Signed)
?Physician Discharge Summary ?  ?Patient: Marcus Bauer MRN: [id number] DOB: [birth year]  ?Admit date:     02/10/2022  ?Discharge date: 02/13/22  ?Discharge Physician: Emeterio Reeve  ? ?PCP: Dion Body, MD  ? ?Recommendations at discharge:  ?Patient is advised to follow-up with primary care physician for continued monitoring, may benefit from repeat BMP/CPK to ensure these are still trending down though they are certainly going in the right direction at time of discharge ?Patient was given resources for rehab/drug and alcohol program  ? ?Discharge Diagnoses: ?Principal Problem: ?  Polysubstance abuse ([place]) ?Active Problems: ?  Rhabdomyolysis ?  Elevated troponin ?  Coronary artery disease of native artery of native heart with stable angina pectoris ([place]) ?  Essential hypertension ?  (HFpEF) heart failure with preserved ejection fraction ([place]) ?  Asthma without status asthmaticus ?  Chest pain with high risk of acute coronary syndrome ? ?Resolved Problems: ?Rhabdomyolysis ?Elevated troponin ? ? ? ?Hospital Course: ? ?41 year old male with medical history significant for CAD, HTN, asthma, chronic diastolic dysfunction CHF.  Brought to ED 02/10/2022 after being found unconscious in a motel, per EMS concern for ingesting crack cocaine, alcohol.  Patient was given Narcan and became more awake, combative, unable to follow commands.  Arrived in the ED, received Ativan, Geodon.  Was awake and cooperative by time admitting physician saw him on 02/11/2022, he stated he does not remember how he got to the hospital.  Admitted to cocaine, marijuana, alcohol use.  Denied chest pain, SOB, nausea, vomiting, dizziness.  Was complaining of muscle soreness, found to be in rhabdomyolysis.  On morning of 02/12/2022, CK and troponins were trending down.  Around noon, he had episode of chest pain and EKG was stable, troponins and CK still trending down, patient responded well to nitro, morphine, Ativan.  Patient remained stable  overnight and lab parameters still trending appropriately on 02/13/2022, patient was eager to go home and discharge was felt appropriate and safe ?  ?Consultants: ?Psychiatry 02/11/2022 to evaluate for inpatient psychiatric hospitalization, he does not meet criteria ?  ?Procedures and significant results: ?CK and troponin on initial presentation to the ED, troponin was 65, this trended down to 32 then down to 24.  Total CK was 629, this trended down to 574, 426 this morning, repeated at this afternoon when patient was having an episode of chest pain and CK was 354, troponin also drawn at that time and was down to 11 (from 24 recently and 65 on initial presentation to ED).  ?CT head no acute abnormality ?Echocardiogram EF 50 to 55%, no regional wall motion abnormalities, mild LVH ? ? ? ?Assessment and Plan: ? ?* Polysubstance abuse ([place]) ?Patient has a history of polysubstance abuse which includes use of crack cocaine, marijuana, opioids, nicotine and alcohol. ?Patient has been counseled on the need to abstain from illicit drug use ? ?Rhabdomyolysis ?Patient noted to have elevated total CK levels related to agitation and possibly to lying unconscious ?Continue IV fluid hydration ?Repeat CK levels in a.m. trending down ?Pt tolerating po  ? ?Elevated troponin ?Patient noted to have elevated troponin levels initially which may be secondary to rhabdomyolysis but were concerning in this patient who has a known history of coronary artery disease. ?Continue aspirin, carvedilol and Lovenox ?Hold atorvastatin due to rhabdomyolysis, can restart on discharge  ?Cycle cardiac enzymes were ok ?Obtain 2D echocardiogram to assess LVEF rule out regional wall motion abnormality - no concerns ? ?Coronary artery disease of native artery of  native heart with stable angina pectoris ([place]) ?Treatment as outlined in 2 ? ?Essential hypertension ?Continue carvedilol and Cozaar ? ?(HFpEF) heart failure with preserved ejection fraction  ([place]) ?Stable and not acutely exacerbated ?Last known LVEF of 60 to 65% from a 2D echocardiogram which was done in 2020. ?Continue carvedilol and Cozaar ?No concerns on 2D echocardiogram ? ?Asthma without status asthmaticus ?Stable.  Not acutely exacerbated ?Continue as needed bronchodilator therapy ? ?Chest pain with high risk of acute coronary syndrome ?See nurse notes, RRT called for chest pain today ?Pt given nitro x3, ativan, morphine  ?EKG stable from yesterday ?Tropes trending down from yesterday, CK also trending down ? ? ? ? ?  ? ? ?Consultants: psychiatry ?Procedures performed: none  ?Disposition: Home ?Diet recommendation:  ?Discharge Diet Orders (From admission, onward)  ? ?  Start     Ordered  ? 02/13/22 0000  Diet - low sodium heart healthy       ? 02/13/22 1040  ? ?  ?  ? ?  ? ?Cardiac diet ?DISCHARGE MEDICATION: ?Allergies as of 02/13/2022   ? ?   Reactions  ? Shellfish Allergy Shortness Of Breath  ? Hydrochlorothiazide Other (See Comments)  ? Intolerance - malaise  ? Other Hives  ? CLOROX  ? ?  ? ?  ?Medication List  ?  ? ?STOP taking these medications   ? ?amoxicillin 875 MG tablet ?Commonly known as: AMOXIL ?  ?naproxen 500 MG tablet ?Commonly known as: Naprosyn ?  ?traMADol 50 MG tablet ?Commonly known as: Ultram ?  ? ?  ? ?TAKE these medications   ? ?albuterol 108 (90 Base) MCG/ACT inhaler ?Commonly known as: VENTOLIN HFA ?Inhale 2 puffs into the lungs every 6 (six) hours as needed for wheezing or shortness of breath. ?  ?amLODipine 5 MG tablet ?Commonly known as: NORVASC ?Take 1 tablet (5 mg total) by mouth daily. ?  ?aspirin 81 MG EC tablet ?Take 1 tablet (81 mg total) by mouth daily. ?  ?atorvastatin 80 MG tablet ?Commonly known as: LIPITOR ?Take 1 tablet (80 mg total) by mouth daily at 6 PM. ?  ?carvedilol 25 MG tablet ?Commonly known as: Coreg ?Take 1 tablet (25 mg total) by mouth 2 (two) times daily. ?  ?losartan 50 MG tablet ?Commonly known as: COZAAR ?Take 2 tablets (100 mg total) by  mouth daily. ?  ?nitroGLYCERIN 0.4 MG SL tablet ?Commonly known as: NITROSTAT ?Place 1 tablet (0.4 mg total) under the tongue every 5 (five) minutes x 3 doses as needed for chest pain. ?  ?thiamine 100 MG tablet ?Take 1 tablet (100 mg total) by mouth daily. ?Start taking on: February 14, 2022 ?  ?traZODone 50 MG tablet ?Commonly known as: DESYREL ?Take 1 tablet (50 mg total) by mouth at bedtime as needed for sleep. ?  ? ?  ? ? ?Discharge Exam: ?Danley Danker Weights  ? 02/10/22 2012  ?Weight: 99.1 kg  ? ?Constitutional:  ?VSS, see nurse notes ?General Appearance: alert, well-developed, well-nourished, NAD ?Eyes: ?Normal lids and conjunctive, non-icteric sclera ?Ears, Nose, Mouth, Throat: ?Normal appearance ?Neck: ?No masses, trachea midline ?Respiratory: ?Normal respiratory effort ?Breath sounds normal, no wheeze/rhonchi/rales ?Cardiovascular: ?S1/S2 normal, no murmur/rub/gallop auscultated ?No carotid bruit or JVD ?No lower extremity edema ?Gastrointestinal: ?Nontender, no masses ?Musculoskeletal:  ?Gait normal ?No clubbing/cyanosis of digits ?Neurological: ?No cranial nerve deficit on limited exam ?Motor and sensation intact and symmetric ?Psychiatric: ?Normal judgment/insight ?Normal mood and affect ? ? ?Condition at discharge: good ? ?The results  of significant diagnostics from this hospitalization (including imaging, microbiology, ancillary and laboratory) are listed below for reference.  ? ?Imaging Studies: ?DG Chest 2 View ? ?Result Date: 01/23/2022 ?CLINICAL DATA:  Chest pain. EXAM: CHEST - 2 VIEW COMPARISON:  August 17, 2021. FINDINGS: The heart size and mediastinal contours are within normal limits. Both lungs are clear. The visualized skeletal structures are unremarkable. IMPRESSION: No active cardiopulmonary disease. Electronically Signed   By: Marijo Conception M.D.   On: 01/23/2022 09:33  ? ?DG Elbow 2 Views Right ? ?Result Date: 02/11/2022 ?CLINICAL DATA:  Right elbow pain. No trauma. Drug overdose. EXAM: RIGHT  ELBOW - 2 VIEW COMPARISON:  None FINDINGS: The joint spaces are maintained. No degenerative changes. No acute bony abnormality. No joint effusion. IMPRESSION: No acute bony findings or joint effusion. Electronically Si

## 2022-02-13 NOTE — Plan of Care (Signed)
  Problem: Safety: Goal: Violent Restraint(s) Outcome: Progressing
